# Patient Record
Sex: Female | Born: 1945 | Race: White | Hispanic: No | Smoking: Former smoker
Health system: Southern US, Community
[De-identification: ages and names within clinical notes are randomized; demographics above are authoritative.]

## PROBLEM LIST (undated history)

## (undated) DIAGNOSIS — D751 Secondary polycythemia: Secondary | ICD-10-CM

## (undated) DIAGNOSIS — M810 Age-related osteoporosis without current pathological fracture: Secondary | ICD-10-CM

## (undated) DIAGNOSIS — K209 Esophagitis, unspecified without bleeding: Secondary | ICD-10-CM

## (undated) DIAGNOSIS — J449 Chronic obstructive pulmonary disease, unspecified: Secondary | ICD-10-CM

## (undated) DIAGNOSIS — K219 Gastro-esophageal reflux disease without esophagitis: Secondary | ICD-10-CM

## (undated) DIAGNOSIS — J189 Pneumonia, unspecified organism: Secondary | ICD-10-CM

## (undated) DIAGNOSIS — Z87891 Personal history of nicotine dependence: Secondary | ICD-10-CM

## (undated) DIAGNOSIS — K5520 Angiodysplasia of colon without hemorrhage: Secondary | ICD-10-CM

## (undated) DIAGNOSIS — K5730 Diverticulosis of large intestine without perforation or abscess without bleeding: Secondary | ICD-10-CM

## (undated) HISTORY — DX: Hereditary hemochromatosis: E83.110

## (undated) HISTORY — PX: ABDOMINAL HYSTERECTOMY: SHX81

## (undated) HISTORY — PX: CATARACT EXTRACTION: SUR2

---

## 2000-10-17 ENCOUNTER — Ambulatory Visit (HOSPITAL_COMMUNITY): Admission: RE | Admit: 2000-10-17 | Discharge: 2000-10-17 | Payer: Self-pay | Admitting: Family Medicine

## 2000-10-17 ENCOUNTER — Encounter: Payer: Self-pay | Admitting: Family Medicine

## 2001-03-11 ENCOUNTER — Ambulatory Visit (HOSPITAL_COMMUNITY): Admission: RE | Admit: 2001-03-11 | Discharge: 2001-03-11 | Payer: Self-pay | Admitting: Family Medicine

## 2001-03-11 ENCOUNTER — Encounter: Payer: Self-pay | Admitting: Family Medicine

## 2002-05-06 ENCOUNTER — Ambulatory Visit (HOSPITAL_COMMUNITY): Admission: RE | Admit: 2002-05-06 | Discharge: 2002-05-06 | Payer: Self-pay | Admitting: General Surgery

## 2002-05-06 ENCOUNTER — Encounter: Payer: Self-pay | Admitting: General Surgery

## 2002-07-23 ENCOUNTER — Encounter: Payer: Self-pay | Admitting: Family Medicine

## 2002-07-23 ENCOUNTER — Ambulatory Visit (HOSPITAL_COMMUNITY): Admission: RE | Admit: 2002-07-23 | Discharge: 2002-07-23 | Payer: Self-pay | Admitting: Family Medicine

## 2002-09-29 ENCOUNTER — Encounter: Payer: Self-pay | Admitting: Internal Medicine

## 2002-09-29 ENCOUNTER — Inpatient Hospital Stay (HOSPITAL_COMMUNITY): Admission: AD | Admit: 2002-09-29 | Discharge: 2002-10-01 | Payer: Self-pay | Admitting: Internal Medicine

## 2002-11-03 ENCOUNTER — Ambulatory Visit (HOSPITAL_COMMUNITY): Admission: RE | Admit: 2002-11-03 | Discharge: 2002-11-03 | Payer: Self-pay | Admitting: Internal Medicine

## 2002-11-03 ENCOUNTER — Encounter: Payer: Self-pay | Admitting: Internal Medicine

## 2004-02-17 ENCOUNTER — Ambulatory Visit (HOSPITAL_COMMUNITY): Admission: RE | Admit: 2004-02-17 | Discharge: 2004-02-17 | Payer: Self-pay | Admitting: Family Medicine

## 2004-03-30 ENCOUNTER — Ambulatory Visit (HOSPITAL_COMMUNITY): Admission: RE | Admit: 2004-03-30 | Discharge: 2004-03-30 | Payer: Self-pay | Admitting: Family Medicine

## 2004-04-12 ENCOUNTER — Ambulatory Visit (HOSPITAL_COMMUNITY): Admission: RE | Admit: 2004-04-12 | Discharge: 2004-04-12 | Payer: Self-pay | Admitting: Pulmonary Disease

## 2004-04-20 ENCOUNTER — Ambulatory Visit (HOSPITAL_COMMUNITY): Admission: RE | Admit: 2004-04-20 | Discharge: 2004-04-20 | Payer: Self-pay | Admitting: Family Medicine

## 2004-08-08 ENCOUNTER — Ambulatory Visit (HOSPITAL_COMMUNITY): Admission: RE | Admit: 2004-08-08 | Discharge: 2004-08-08 | Payer: Self-pay | Admitting: Internal Medicine

## 2004-08-29 ENCOUNTER — Ambulatory Visit (HOSPITAL_COMMUNITY): Admission: RE | Admit: 2004-08-29 | Discharge: 2004-08-29 | Payer: Self-pay | Admitting: General Surgery

## 2005-11-23 ENCOUNTER — Ambulatory Visit (HOSPITAL_COMMUNITY): Admission: RE | Admit: 2005-11-23 | Discharge: 2005-11-23 | Payer: Self-pay | Admitting: Internal Medicine

## 2007-08-20 ENCOUNTER — Ambulatory Visit (HOSPITAL_COMMUNITY): Admission: RE | Admit: 2007-08-20 | Discharge: 2007-08-20 | Payer: Self-pay | Admitting: Internal Medicine

## 2007-11-20 ENCOUNTER — Ambulatory Visit (HOSPITAL_COMMUNITY): Admission: RE | Admit: 2007-11-20 | Discharge: 2007-11-20 | Payer: Self-pay | Admitting: Internal Medicine

## 2008-01-20 ENCOUNTER — Encounter (HOSPITAL_COMMUNITY): Admission: RE | Admit: 2008-01-20 | Discharge: 2008-02-19 | Payer: Self-pay | Admitting: Oncology

## 2008-01-20 ENCOUNTER — Ambulatory Visit (HOSPITAL_COMMUNITY): Payer: Self-pay | Admitting: Oncology

## 2008-02-25 ENCOUNTER — Encounter (HOSPITAL_COMMUNITY): Admission: RE | Admit: 2008-02-25 | Discharge: 2008-03-26 | Payer: Self-pay | Admitting: Oncology

## 2008-03-30 ENCOUNTER — Encounter (HOSPITAL_COMMUNITY): Admission: RE | Admit: 2008-03-30 | Discharge: 2008-04-29 | Payer: Self-pay | Admitting: Oncology

## 2008-03-30 ENCOUNTER — Ambulatory Visit (HOSPITAL_COMMUNITY): Payer: Self-pay | Admitting: Oncology

## 2008-06-02 ENCOUNTER — Ambulatory Visit (HOSPITAL_COMMUNITY): Admission: RE | Admit: 2008-06-02 | Discharge: 2008-06-02 | Payer: Self-pay | Admitting: Internal Medicine

## 2008-09-30 ENCOUNTER — Ambulatory Visit (HOSPITAL_COMMUNITY): Admission: RE | Admit: 2008-09-30 | Discharge: 2008-09-30 | Payer: Self-pay | Admitting: Internal Medicine

## 2010-04-17 ENCOUNTER — Encounter: Payer: Self-pay | Admitting: General Surgery

## 2010-07-11 LAB — CBC
Hemoglobin: 13.4 g/dL (ref 12.0–15.0)
MCHC: 33.4 g/dL (ref 30.0–36.0)
Platelets: 426 10*3/uL — ABNORMAL HIGH (ref 150–400)
RDW: 13.1 % (ref 11.5–15.5)

## 2010-07-11 LAB — HEMOCHROMATOSIS DNA-PCR(C282Y,H63D)

## 2010-07-11 LAB — FERRITIN: Ferritin: 436 ng/mL — ABNORMAL HIGH (ref 10–291)

## 2010-08-12 NOTE — Discharge Summary (Signed)
   NAMEINOLA, Leah Dalton                        ACCOUNT NO.:  [id number]   MEDICAL RECORD NO.:  [id number]                   PATIENT TYPE:  INP   LOCATION:  A209                                 FACILITY:  [facility]   PHYSICIAN:  Madelin Rear. Sherwood Gambler, M.D.             DATE OF BIRTH:  28-Feb-1946   DATE OF ADMISSION:  09/29/2002  DATE OF DISCHARGE:  10/01/2002                                 DISCHARGE SUMMARY   DISCHARGE DIAGNOSES:  1. Acute exacerbation of chronic obstructive pulmonary disease.  2. Pneumonia.  3. Respiratory insufficiency.   SUMMARY:  The patient was admitted for progressive dyspnea, cough, sputum,  fever, rigors, and chills, and severe malaise.  Attempts at management at  home were unsuccessful and the patient progressed in her symptomatology.  She was admitted and given IV bronchodilators as well as antibiotics and  around-the-clock nebulizers.  She responded well to these interventions with  an increase in her energy level, decrease in wheezing, and defervescence.   DISCHARGE MEDICATIONS:  1. Theo-Dur 200 mg p.o. b.i.d.  2. Combivent 2 puffs q.i.d.  3. Medrol Dosepak.  4. LevaPak.                                               Madelin Rear. Sherwood Gambler, M.D.    LJF/MEDQ  D:  10/23/2002  T:  10/23/2002  Job:  [id number]

## 2010-08-12 NOTE — H&P (Signed)
   Leah Dalton, Leah Dalton                          ACCOUNT NO.:  [id number]   MEDICAL RECORD NO.:  [id number]                  PATIENT TYPE:   LOCATION:                                       FACILITY:   PHYSICIAN:  Madelin Rear. Sherwood Gambler, M.D.             DATE OF BIRTH:  October 02, 1945   DATE OF ADMISSION:  DATE OF DISCHARGE:                                HISTORY & PHYSICAL   CHIEF COMPLAINT:  Shortness of breath.   HISTORY OF PRESENT ILLNESS:  The patient has had 3 or 4 days of  progressively increasing dyspnea, cough productive of sputum, fever, rigors  or chills and severe malaise.  He also admitted to some myalgias.  No  vomiting, no chest pain, palpitations or syncope.  No hematemesis,  hematochezia or melena.   ALLERGIES:  CODEINE and KEFLEX allergic.   PAST MEDICAL HISTORY:  She does have a past history of gastroesophageal  reflux disease and maintained on Prevacid. She has had COPD in the past and  maintained on Atrovent inhaler.   SOCIAL HISTORY:  She admits to cigarette smoking.  Admits to alcohol daily  usage, but no other substance abuses of note.   FAMILY HISTORY:  Positive for coronary artery disease and breast cancer.  Has 1 son, age 32, in good health.   REVIEW OF SYSTEMS:  As under HPI, otherwise negative.   PHYSICAL EXAMINATION:  VITAL SIGNS:  Temperature is 100, pulse is 90,  respirations 18 with diminished breath sounds.  Her blood pressure is  110/62.  Weight was 135 1/2 pounds.  GENERAL: She appears moderately ill although nontoxic.  HEAD AND NECK:  Show no JVD or adenopathy.  Neck was supple.  CHEST:  Diminished breath sounds bilaterally with end expiratory wheezing,  prolonged expiratory phase.  CARDIOVASCULAR:  Cardiac exam regular rhythm.  No murmur, gallop, or rub.  ABDOMEN:  Soft, no organomegaly or masses.  EXTREMITIES:  Without clubbing, cyanosis, or edema.  NEUROLOGIC:  Examination is nonfocal.   LABORATORY DATA:  Laboratory studies are pending at  the present time with  blood cultures, chest x-ray, blood count, and metabolic profile.    IMPRESSION AND PLAN:  The patient appears clinically to have severe chronic  obstructive pulmonary disease (COPD) exacerbation with pneumonia possible.  She will be admitted for bronchodilator therapy, respiratory support,  intravenous antibiotics, intravenous hydration, and expectant observation.                                               Madelin Rear. Sherwood Gambler, M.D.    LJF/MEDQ  D:  09/29/2002  T:  09/29/2002  Job:  [id number]

## 2010-08-12 NOTE — Procedures (Signed)
[name], Leah Dalton              ACCOUNT NO.:  [id number]   MEDICAL RECORD NO.:  [id number]          PATIENT TYPE:  OUT   LOCATION:  RAD                           FACILITY:  [facility]   PHYSICIAN:  Edward L. Juanetta Gosling, M.D.DATE OF BIRTH:  [birth year]   DATE OF PROCEDURE:  DATE OF DISCHARGE:                              PULMONARY FUNCTION TEST   1.  Spirometry shows a moderate to severe ventilatory defect with evidence      of air flow obstruction.  2.  Lung volumes are normal with evidence of air trapping.  3.  DLCO is severely reduced.  4.  Arterial blood gases are normal.  5.  There is no significant bronchodilator effect.     Edwa   ELH/MEDQ  D:  04/13/2004  T:  04/13/2004  Job:  [id number]   cc:   [facility]

## 2010-12-27 LAB — COMPREHENSIVE METABOLIC PANEL
AST: 15 U/L (ref 0–37)
Albumin: 3.9 g/dL (ref 3.5–5.2)
BUN: 4 mg/dL — ABNORMAL LOW (ref 6–23)
Calcium: 9.6 mg/dL (ref 8.4–10.5)
Creatinine, Ser: 0.43 mg/dL (ref 0.4–1.2)
GFR calc Af Amer: >60 mL/min (ref >60)
Total Bilirubin: 0.6 mg/dL (ref 0.3–1.2)

## 2010-12-27 LAB — CBC
HCT: 42.3 % (ref 36.0–46.0)
MCHC: 34 g/dL (ref 30.0–36.0)
MCV: 98.9 fL (ref 78.0–100.0)
Platelets: 415 10*3/uL — ABNORMAL HIGH (ref 150–400)
RDW: 12.8 % (ref 11.5–15.5)

## 2010-12-27 LAB — THERAPEUTIC PHLEBOTOMY: Volume Drawn: 500

## 2010-12-29 LAB — THERAPEUTIC PHLEBOTOMY: Volume Drawn: 500

## 2011-04-17 DIAGNOSIS — J449 Chronic obstructive pulmonary disease, unspecified: Secondary | ICD-10-CM | POA: Diagnosis not present

## 2011-04-19 DIAGNOSIS — J441 Chronic obstructive pulmonary disease with (acute) exacerbation: Secondary | ICD-10-CM | POA: Diagnosis not present

## 2011-04-19 DIAGNOSIS — J438 Other emphysema: Secondary | ICD-10-CM | POA: Diagnosis not present

## 2011-05-31 DIAGNOSIS — J449 Chronic obstructive pulmonary disease, unspecified: Secondary | ICD-10-CM | POA: Diagnosis not present

## 2011-05-31 DIAGNOSIS — J438 Other emphysema: Secondary | ICD-10-CM | POA: Diagnosis not present

## 2011-06-28 DIAGNOSIS — J438 Other emphysema: Secondary | ICD-10-CM | POA: Diagnosis not present

## 2011-06-28 DIAGNOSIS — M810 Age-related osteoporosis without current pathological fracture: Secondary | ICD-10-CM | POA: Diagnosis not present

## 2011-06-28 DIAGNOSIS — Z13820 Encounter for screening for osteoporosis: Secondary | ICD-10-CM | POA: Diagnosis not present

## 2011-06-28 DIAGNOSIS — Z780 Asymptomatic menopausal state: Secondary | ICD-10-CM | POA: Diagnosis not present

## 2011-06-28 DIAGNOSIS — Z90710 Acquired absence of both cervix and uterus: Secondary | ICD-10-CM | POA: Diagnosis not present

## 2011-07-06 DIAGNOSIS — R635 Abnormal weight gain: Secondary | ICD-10-CM | POA: Diagnosis not present

## 2011-07-06 DIAGNOSIS — Z13220 Encounter for screening for lipoid disorders: Secondary | ICD-10-CM | POA: Diagnosis not present

## 2011-07-06 DIAGNOSIS — J449 Chronic obstructive pulmonary disease, unspecified: Secondary | ICD-10-CM | POA: Diagnosis not present

## 2011-07-20 DIAGNOSIS — R635 Abnormal weight gain: Secondary | ICD-10-CM | POA: Diagnosis not present

## 2011-07-20 DIAGNOSIS — M810 Age-related osteoporosis without current pathological fracture: Secondary | ICD-10-CM | POA: Diagnosis not present

## 2011-07-20 DIAGNOSIS — J449 Chronic obstructive pulmonary disease, unspecified: Secondary | ICD-10-CM | POA: Diagnosis not present

## 2011-07-20 DIAGNOSIS — R7301 Impaired fasting glucose: Secondary | ICD-10-CM | POA: Diagnosis not present

## 2011-07-27 DIAGNOSIS — J449 Chronic obstructive pulmonary disease, unspecified: Secondary | ICD-10-CM | POA: Diagnosis not present

## 2011-07-27 DIAGNOSIS — J438 Other emphysema: Secondary | ICD-10-CM | POA: Diagnosis not present

## 2011-10-12 DIAGNOSIS — M810 Age-related osteoporosis without current pathological fracture: Secondary | ICD-10-CM | POA: Diagnosis not present

## 2011-10-12 DIAGNOSIS — J449 Chronic obstructive pulmonary disease, unspecified: Secondary | ICD-10-CM | POA: Diagnosis not present

## 2011-10-12 DIAGNOSIS — R498 Other voice and resonance disorders: Secondary | ICD-10-CM | POA: Diagnosis not present

## 2011-11-16 DIAGNOSIS — Z1239 Encounter for other screening for malignant neoplasm of breast: Secondary | ICD-10-CM | POA: Diagnosis not present

## 2011-11-16 DIAGNOSIS — M810 Age-related osteoporosis without current pathological fracture: Secondary | ICD-10-CM | POA: Diagnosis not present

## 2011-11-28 DIAGNOSIS — Z1231 Encounter for screening mammogram for malignant neoplasm of breast: Secondary | ICD-10-CM | POA: Diagnosis not present

## 2011-11-29 DIAGNOSIS — IMO0002 Reserved for concepts with insufficient information to code with codable children: Secondary | ICD-10-CM | POA: Diagnosis not present

## 2011-11-29 DIAGNOSIS — M810 Age-related osteoporosis without current pathological fracture: Secondary | ICD-10-CM | POA: Diagnosis not present

## 2011-11-29 DIAGNOSIS — M25569 Pain in unspecified knee: Secondary | ICD-10-CM | POA: Diagnosis not present

## 2011-11-30 DIAGNOSIS — J438 Other emphysema: Secondary | ICD-10-CM | POA: Diagnosis not present

## 2011-11-30 DIAGNOSIS — R0989 Other specified symptoms and signs involving the circulatory and respiratory systems: Secondary | ICD-10-CM | POA: Diagnosis not present

## 2011-11-30 DIAGNOSIS — J449 Chronic obstructive pulmonary disease, unspecified: Secondary | ICD-10-CM | POA: Diagnosis not present

## 2011-12-07 DIAGNOSIS — R05 Cough: Secondary | ICD-10-CM | POA: Diagnosis not present

## 2011-12-07 DIAGNOSIS — R0902 Hypoxemia: Secondary | ICD-10-CM | POA: Diagnosis not present

## 2011-12-07 DIAGNOSIS — J441 Chronic obstructive pulmonary disease with (acute) exacerbation: Secondary | ICD-10-CM | POA: Diagnosis not present

## 2011-12-26 DIAGNOSIS — J0100 Acute maxillary sinusitis, unspecified: Secondary | ICD-10-CM | POA: Diagnosis not present

## 2011-12-26 DIAGNOSIS — J441 Chronic obstructive pulmonary disease with (acute) exacerbation: Secondary | ICD-10-CM | POA: Diagnosis not present

## 2012-01-08 DIAGNOSIS — J441 Chronic obstructive pulmonary disease with (acute) exacerbation: Secondary | ICD-10-CM | POA: Diagnosis not present

## 2012-01-08 DIAGNOSIS — Z23 Encounter for immunization: Secondary | ICD-10-CM | POA: Diagnosis not present

## 2012-02-24 DIAGNOSIS — R071 Chest pain on breathing: Secondary | ICD-10-CM | POA: Diagnosis not present

## 2012-02-24 DIAGNOSIS — Z79899 Other long term (current) drug therapy: Secondary | ICD-10-CM | POA: Diagnosis not present

## 2012-02-24 DIAGNOSIS — R091 Pleurisy: Secondary | ICD-10-CM | POA: Diagnosis not present

## 2012-02-24 DIAGNOSIS — R079 Chest pain, unspecified: Secondary | ICD-10-CM | POA: Diagnosis not present

## 2012-02-24 DIAGNOSIS — Z888 Allergy status to other drugs, medicaments and biological substances status: Secondary | ICD-10-CM | POA: Diagnosis not present

## 2012-02-24 DIAGNOSIS — R0602 Shortness of breath: Secondary | ICD-10-CM | POA: Diagnosis not present

## 2012-02-24 DIAGNOSIS — J449 Chronic obstructive pulmonary disease, unspecified: Secondary | ICD-10-CM | POA: Diagnosis not present

## 2012-02-24 DIAGNOSIS — Z90710 Acquired absence of both cervix and uterus: Secondary | ICD-10-CM | POA: Diagnosis not present

## 2012-02-24 DIAGNOSIS — R0989 Other specified symptoms and signs involving the circulatory and respiratory systems: Secondary | ICD-10-CM | POA: Diagnosis not present

## 2012-02-24 DIAGNOSIS — Z881 Allergy status to other antibiotic agents status: Secondary | ICD-10-CM | POA: Diagnosis not present

## 2012-02-24 DIAGNOSIS — Z880 Allergy status to penicillin: Secondary | ICD-10-CM | POA: Diagnosis not present

## 2012-02-24 DIAGNOSIS — Z9849 Cataract extraction status, unspecified eye: Secondary | ICD-10-CM | POA: Diagnosis not present

## 2012-02-24 DIAGNOSIS — R0609 Other forms of dyspnea: Secondary | ICD-10-CM | POA: Diagnosis not present

## 2012-05-16 DIAGNOSIS — L821 Other seborrheic keratosis: Secondary | ICD-10-CM | POA: Diagnosis not present

## 2012-05-16 DIAGNOSIS — J438 Other emphysema: Secondary | ICD-10-CM | POA: Diagnosis not present

## 2012-05-16 DIAGNOSIS — Z6826 Body mass index (BMI) 26.0-26.9, adult: Secondary | ICD-10-CM | POA: Diagnosis not present

## 2012-05-16 DIAGNOSIS — H6120 Impacted cerumen, unspecified ear: Secondary | ICD-10-CM | POA: Diagnosis not present

## 2012-05-29 DIAGNOSIS — D234 Other benign neoplasm of skin of scalp and neck: Secondary | ICD-10-CM | POA: Diagnosis not present

## 2012-05-29 DIAGNOSIS — L989 Disorder of the skin and subcutaneous tissue, unspecified: Secondary | ICD-10-CM | POA: Diagnosis not present

## 2012-05-29 DIAGNOSIS — H60399 Other infective otitis externa, unspecified ear: Secondary | ICD-10-CM | POA: Diagnosis not present

## 2012-05-30 DIAGNOSIS — L821 Other seborrheic keratosis: Secondary | ICD-10-CM | POA: Diagnosis not present

## 2012-06-07 ENCOUNTER — Emergency Department (HOSPITAL_COMMUNITY): Payer: Medicare Other

## 2012-06-07 ENCOUNTER — Inpatient Hospital Stay (HOSPITAL_COMMUNITY)
Admission: EM | Admit: 2012-06-07 | Discharge: 2012-06-10 | DRG: 190 | Disposition: A | Discharge status: Home Health | Payer: Medicare Other | Attending: Internal Medicine | Admitting: Internal Medicine

## 2012-06-07 ENCOUNTER — Encounter (HOSPITAL_COMMUNITY): Payer: Self-pay | Admitting: *Deleted

## 2012-06-07 DIAGNOSIS — D45 Polycythemia vera: Secondary | ICD-10-CM | POA: Diagnosis not present

## 2012-06-07 DIAGNOSIS — J329 Chronic sinusitis, unspecified: Secondary | ICD-10-CM | POA: Diagnosis present

## 2012-06-07 DIAGNOSIS — Z87891 Personal history of nicotine dependence: Secondary | ICD-10-CM | POA: Diagnosis not present

## 2012-06-07 DIAGNOSIS — J9620 Acute and chronic respiratory failure, unspecified whether with hypoxia or hypercapnia: Secondary | ICD-10-CM | POA: Diagnosis present

## 2012-06-07 DIAGNOSIS — Z9981 Dependence on supplemental oxygen: Secondary | ICD-10-CM | POA: Diagnosis not present

## 2012-06-07 DIAGNOSIS — J438 Other emphysema: Secondary | ICD-10-CM | POA: Diagnosis not present

## 2012-06-07 DIAGNOSIS — J449 Chronic obstructive pulmonary disease, unspecified: Secondary | ICD-10-CM | POA: Diagnosis present

## 2012-06-07 DIAGNOSIS — M810 Age-related osteoporosis without current pathological fracture: Secondary | ICD-10-CM | POA: Diagnosis present

## 2012-06-07 DIAGNOSIS — J440 Chronic obstructive pulmonary disease with acute lower respiratory infection: Principal | ICD-10-CM | POA: Diagnosis present

## 2012-06-07 DIAGNOSIS — J441 Chronic obstructive pulmonary disease with (acute) exacerbation: Secondary | ICD-10-CM

## 2012-06-07 DIAGNOSIS — Z79899 Other long term (current) drug therapy: Secondary | ICD-10-CM

## 2012-06-07 DIAGNOSIS — D751 Secondary polycythemia: Secondary | ICD-10-CM | POA: Diagnosis present

## 2012-06-07 DIAGNOSIS — E871 Hypo-osmolality and hyponatremia: Secondary | ICD-10-CM | POA: Diagnosis not present

## 2012-06-07 DIAGNOSIS — J209 Acute bronchitis, unspecified: Principal | ICD-10-CM | POA: Diagnosis present

## 2012-06-07 DIAGNOSIS — R0602 Shortness of breath: Secondary | ICD-10-CM | POA: Diagnosis not present

## 2012-06-07 HISTORY — DX: Secondary polycythemia: D75.1

## 2012-06-07 HISTORY — DX: Age-related osteoporosis without current pathological fracture: M81.0

## 2012-06-07 HISTORY — DX: Chronic obstructive pulmonary disease, unspecified: J44.9

## 2012-06-07 HISTORY — DX: Personal history of nicotine dependence: Z87.891

## 2012-06-07 LAB — BASIC METABOLIC PANEL
BUN: <3 mg/dL — ABNORMAL LOW (ref 6–23)
CO2: 24 mEq/L (ref 19–32)
Calcium: 9.1 mg/dL (ref 8.4–10.5)
Creatinine, Ser: 0.43 mg/dL — ABNORMAL LOW (ref 0.50–1.10)
GFR calc non Af Amer: >90 mL/min (ref >90)
Glucose, Bld: 117 mg/dL — ABNORMAL HIGH (ref 70–99)

## 2012-06-07 LAB — CBC WITH DIFFERENTIAL/PLATELET
Eosinophils Absolute: 0.1 10*3/uL (ref 0.0–0.7)
Eosinophils Relative: 1 % (ref 0–5)
HCT: 47.8 % — ABNORMAL HIGH (ref 36.0–46.0)
Lymphocytes Relative: 7 % — ABNORMAL LOW (ref 12–46)
Lymphs Abs: 0.6 10*3/uL — ABNORMAL LOW (ref 0.7–4.0)
MCH: 31.1 pg (ref 26.0–34.0)
MCV: 93.5 fL (ref 78.0–100.0)
Monocytes Absolute: 0.8 10*3/uL (ref 0.1–1.0)
Platelets: 265 10*3/uL (ref 150–400)
RBC: 5.11 MIL/uL (ref 3.87–5.11)
WBC: 7.8 10*3/uL (ref 4.0–10.5)

## 2012-06-07 MED ORDER — METHYLPREDNISOLONE SODIUM SUCC 125 MG IJ SOLR
80.0000 mg | Freq: Four times a day (QID) | INTRAMUSCULAR | Status: DC
  Administered 2012-06-07 – 2012-06-08 (×3): 80 mg via INTRAVENOUS
  Filled 2012-06-07 (×3): qty 2

## 2012-06-07 MED ORDER — ALPRAZOLAM 0.5 MG PO TABS
0.5000 mg | ORAL_TABLET | Freq: Three times a day (TID) | ORAL | Status: DC | PRN
  Administered 2012-06-07 – 2012-06-09 (×3): 0.5 mg via ORAL
  Filled 2012-06-07 (×4): qty 1

## 2012-06-07 MED ORDER — METHYLPREDNISOLONE SODIUM SUCC 125 MG IJ SOLR
125.0000 mg | Freq: Once | INTRAMUSCULAR | Status: AC
  Administered 2012-06-07: 125 mg via INTRAVENOUS
  Filled 2012-06-07: qty 2

## 2012-06-07 MED ORDER — ALBUTEROL SULFATE (5 MG/ML) 0.5% IN NEBU
2.5000 mg | INHALATION_SOLUTION | RESPIRATORY_TRACT | Status: DC
  Administered 2012-06-07 – 2012-06-10 (×16): 2.5 mg via RESPIRATORY_TRACT
  Filled 2012-06-07 (×17): qty 0.5

## 2012-06-07 MED ORDER — ALBUTEROL (5 MG/ML) CONTINUOUS INHALATION SOLN
15.0000 mg | INHALATION_SOLUTION | Freq: Once | RESPIRATORY_TRACT | Status: AC
  Administered 2012-06-07: 15 mg via RESPIRATORY_TRACT
  Filled 2012-06-07: qty 20

## 2012-06-07 MED ORDER — BIOTENE DRY MOUTH MT LIQD
15.0000 mL | Freq: Two times a day (BID) | OROMUCOSAL | Status: DC
  Administered 2012-06-07 – 2012-06-09 (×5): 15 mL via OROMUCOSAL

## 2012-06-07 MED ORDER — BENZONATATE 100 MG PO CAPS
100.0000 mg | ORAL_CAPSULE | Freq: Three times a day (TID) | ORAL | Status: DC
  Administered 2012-06-07 – 2012-06-10 (×9): 100 mg via ORAL
  Filled 2012-06-07 (×9): qty 1

## 2012-06-07 MED ORDER — DOCUSATE SODIUM 100 MG PO CAPS
100.0000 mg | ORAL_CAPSULE | Freq: Two times a day (BID) | ORAL | Status: DC
  Administered 2012-06-08 – 2012-06-10 (×5): 100 mg via ORAL
  Filled 2012-06-07 (×6): qty 1

## 2012-06-07 MED ORDER — HYDROCOD POLST-CHLORPHEN POLST 10-8 MG/5ML PO LQCR
5.0000 mL | Freq: Two times a day (BID) | ORAL | Status: DC | PRN
  Administered 2012-06-08: 5 mL via ORAL
  Filled 2012-06-07 (×2): qty 5

## 2012-06-07 MED ORDER — LEVOFLOXACIN IN D5W 750 MG/150ML IV SOLN
750.0000 mg | INTRAVENOUS | Status: DC
  Administered 2012-06-07: 750 mg via INTRAVENOUS
  Filled 2012-06-07 (×3): qty 150

## 2012-06-07 MED ORDER — CALCIUM CARBONATE 600 MG PO TABS: 600.0000 mg | ORAL_TABLET | Freq: Two times a day (BID) | ORAL | Status: DC

## 2012-06-07 MED ORDER — MOMETASONE FURO-FORMOTEROL FUM 100-5 MCG/ACT IN AERO
2.0000 | INHALATION_SPRAY | Freq: Two times a day (BID) | RESPIRATORY_TRACT | Status: DC
  Administered 2012-06-07 – 2012-06-10 (×6): 2 via RESPIRATORY_TRACT
  Filled 2012-06-07: qty 8.8

## 2012-06-07 MED ORDER — ACETAMINOPHEN 650 MG RE SUPP: 650.0000 mg | Freq: Four times a day (QID) | RECTAL | Status: DC | PRN

## 2012-06-07 MED ORDER — ALUM & MAG HYDROXIDE-SIMETH 200-200-20 MG/5ML PO SUSP
30.0000 mL | Freq: Four times a day (QID) | ORAL | Status: DC | PRN
  Administered 2012-06-09: 30 mL via ORAL
  Filled 2012-06-07: qty 30

## 2012-06-07 MED ORDER — FAMOTIDINE 20 MG PO TABS
20.0000 mg | ORAL_TABLET | Freq: Two times a day (BID) | ORAL | Status: DC
  Administered 2012-06-07 – 2012-06-10 (×6): 20 mg via ORAL
  Filled 2012-06-07 (×6): qty 1

## 2012-06-07 MED ORDER — SODIUM CHLORIDE 0.9 % IV SOLN
INTRAVENOUS | Status: DC
  Administered 2012-06-07: 12:00:00 via INTRAVENOUS

## 2012-06-07 MED ORDER — ACETAMINOPHEN 325 MG PO TABS: 650.0000 mg | ORAL_TABLET | Freq: Four times a day (QID) | ORAL | Status: DC | PRN

## 2012-06-07 MED ORDER — SODIUM CHLORIDE 0.9 % IV SOLN
INTRAVENOUS | Status: AC
  Administered 2012-06-07: 18:00:00 via INTRAVENOUS

## 2012-06-07 MED ORDER — VITAMIN D-3 125 MCG (5000 UT) PO TABS: 1.0000 | ORAL_TABLET | Freq: Every day | ORAL | Status: DC

## 2012-06-07 MED ORDER — ONDANSETRON HCL 4 MG PO TABS: 4.0000 mg | ORAL_TABLET | Freq: Four times a day (QID) | ORAL | Status: DC | PRN

## 2012-06-07 MED ORDER — ONDANSETRON HCL 4 MG/2ML IJ SOLN: 4.0000 mg | Freq: Four times a day (QID) | INTRAMUSCULAR | Status: DC | PRN

## 2012-06-07 MED ORDER — ALBUTEROL SULFATE (5 MG/ML) 0.5% IN NEBU: 2.5000 mg | INHALATION_SOLUTION | RESPIRATORY_TRACT | Status: DC | PRN

## 2012-06-07 MED ORDER — DM-GUAIFENESIN ER 30-600 MG PO TB12
1.0000 | ORAL_TABLET | Freq: Two times a day (BID) | ORAL | Status: DC
  Administered 2012-06-07 – 2012-06-10 (×6): 1 via ORAL
  Filled 2012-06-07 (×6): qty 1

## 2012-06-07 MED ORDER — IPRATROPIUM BROMIDE 0.02 % IN SOLN
1.0000 mg | Freq: Once | RESPIRATORY_TRACT | Status: AC
  Administered 2012-06-07: 1 mg via RESPIRATORY_TRACT
  Filled 2012-06-07: qty 5

## 2012-06-07 MED ORDER — VITAMIN D 1000 UNITS PO TABS
4000.0000 [IU] | ORAL_TABLET | Freq: Every day | ORAL | Status: DC
  Administered 2012-06-08 – 2012-06-10 (×3): 4000 [IU] via ORAL
  Filled 2012-06-07 (×3): qty 4

## 2012-06-07 MED ORDER — CALCIUM CARBONATE 1250 (500 CA) MG PO TABS
1.0000 | ORAL_TABLET | Freq: Two times a day (BID) | ORAL | Status: DC
  Administered 2012-06-07 – 2012-06-10 (×6): 500 mg via ORAL
  Filled 2012-06-07 (×6): qty 1

## 2012-06-07 MED ORDER — IPRATROPIUM BROMIDE 0.02 % IN SOLN
0.5000 mg | RESPIRATORY_TRACT | Status: DC
  Administered 2012-06-07 – 2012-06-10 (×16): 0.5 mg via RESPIRATORY_TRACT
  Filled 2012-06-07 (×17): qty 2.5

## 2012-06-07 NOTE — H&P (Signed)
[facility] History and Physical  Leah DREIBELBIS Dalton:[id number] DOB: 09/07/1945 DOA: 06/07/2012  Referring physician: Dr. Clarene Duke PCP: Leah Dalton., MD that she continue Specialists:   Chief Complaint: Shortness of breath.  HPI: Leah Dalton is a 67 y.o. female with a history significant for oxygen-dependent COPD who presents to the emergency department today with a chief complaint of shortness of breath. Her symptoms started approximately 5 days ago. Generally, when her "bronchitis acts up ", she starts to have a burning throat and  burning in her chest. These symptoms started 5 days ago. She called her primary care physician, Dr. Sherwood Dalton. He called in a prescription for Avelox and oral albuterol and recommended that she continue her nebulizer treatments. She took Avelox for 4 days, but she became progressively short of breath and developed chest congestion and wheezing. She started coughing with  production of yellow sputum. She has nasal congestion. She had one episode of posttussive emesis. She denies subjective fever or chills. She denies abdominal pain. She denies pleurisy. She denies swelling in her legs. She quit smoking in October 2012.  In the emergency department, she is afebrile and mildly tachycardic following a 1 hour continuous albuterol nebulizer per ED physician. Her chest x-ray reveals emphysema with no acute changes. Her lab data are significant for a serum sodium of 133, hemoglobin of 15.9, and glucose of 117. Her EKG reveals normal sinus rhythm with a heart rate of 91 beats per minute and nonspecific T-wave abnormalities. She is being admitted for further evaluation and management.   Review of Systems: As above in history present illness. The patient denies anorexia, fever, weight loss,, vision loss, decreased hearing, hoarseness, syncope, dyspnea on exertion, peripheral edema, balance deficits, hemoptysis, abdominal pain, melena, hematochezia, severe  indigestion/heartburn, hematuria, incontinence, genital sores, muscle weakness, suspicious skin lesions, transient blindness, difficulty walking, depression, unusual weight change, abnormal bleeding, enlarged lymph nodes, angioedema, and breast masses.    Past Medical History  Diagnosis Date  . COPD (chronic obstructive pulmonary disease)     O2 dependent, started 05/31/2012  . Polycythemia   . History of tobacco abuse   . Osteoporosis    Past Surgical History  Procedure Laterality Date  . Abdominal hysterectomy    . Cataract extraction     Social History: She is divorced. She has one son. She is retired. She stopped smoking in October 2012. She drinks a couple of beers each night. She denies any illicit drug use.    Allergies  Allergen Reactions  . Codeine Itching  . Daliresp (Roflumilast) Diarrhea  . Keflex (Cephalexin) Other (See Comments)    Gives pt yeast infection  . Penicillins Itching    Family history: Her mother died of breast cancer. Her father died of prostate cancer.  Prior to Admission medications   Medication Sig Start Date End Date Taking? Authorizing Ashaun Gaughan  albuterol (PROVENTIL HFA;VENTOLIN HFA) 108 (90 BASE) MCG/ACT inhaler Inhale 2 puffs into the lungs every 6 (six) hours as needed for wheezing.   Yes Historical Nayef College, MD  albuterol (PROVENTIL) (2.5 MG/3ML) 0.083% nebulizer solution Take 2.5 mg by nebulization 3 (three) times daily.   Yes Historical Camry Theiss, MD  calcium carbonate (OS-CAL) 600 MG TABS Take 600 mg by mouth 2 (two) times daily with a meal.   Yes Historical Abishai Viegas, MD  Cholecalciferol (VITAMIN D-3) 5000 UNITS TABS Take 1 tablet by mouth daily.   Yes Historical Nolawi Kanady, MD  dextromethorphan-guaiFENesin (MUCINEX DM) 30-600 MG per 12 hr tablet Take 1  tablet by mouth every 12 (twelve) hours.   Yes Historical Rhona Fusilier, MD  famotidine (PEPCID AC) 10 MG chewable tablet Chew 10 mg by mouth 2 (two) times daily.   Yes Historical Jamerica Snavely, MD   Fluticasone-Salmeterol (ADVAIR) 250-50 MCG/DOSE AEPB Inhale 1 puff into the lungs every 12 (twelve) hours.   Yes Historical Emnet Monk, MD  ipratropium (ATROVENT) 0.02 % nebulizer solution Take 500 mcg by nebulization 3 (three) times daily.   Yes Historical Chanan Detwiler, MD   Physical Exam: Filed Vitals:   06/07/12 1400 06/07/12 1403 06/07/12 1500 06/07/12 1711  BP: 142/79 137/82 144/85 149/85  Pulse: 108 108 103 105  Temp:    98.2 F (36.8 C)  TempSrc:      Resp: 17 18 17 20   Height:    5\' 4"  (1.626 m)  Weight:    68 kg (149 lb 14.6 oz)  SpO2: 93% 94% 94% 95%     General:  67 year old Caucasian woman sitting up in the bed, in no acute distress.  Eyes: Pupils equal, round, and reactive to light. Extraocular was are intact. Conjunctivae are clear. Sclerae are white.  ENT: Tympanic membranes are clear bilaterally. Nasal mucosa is moist, without rhinorrhea. Oropharynx reveals mildly dry mucous membranes. No posterior exudates or erythema.  Neck: Supple, no adenopathy, no thyromegaly, no JVD.  Cardiovascular: S1, S2, with tachycardia.  Respiratory: Rhonchus wheezing bilaterally, breathing nonlabored.  Abdomen: Positive bowel sounds, soft, nontender, nondistended.  Skin: Fair to good turgor. No rashes.  Musculoskeletal: No acute hot red joints. Pedal pulses palpable.  Psychiatric: Pleasant affect. Alert and oriented x3. Speech is clear.  Neurologic: Cranial nerves II through XII are intact. Strength is 5 over 5. Sensation is intact.  Labs on Admission:  Basic Metabolic Panel:  Recent Labs Lab 06/07/12 1042  NA 133*  K 3.7  CL 97  CO2 24  GLUCOSE 117*  BUN <3*  CREATININE 0.43*  CALCIUM 9.1   Liver Function Tests: No results found for this basename: AST, ALT, ALKPHOS, BILITOT, PROT, ALBUMIN,  in the last 168 hours No results found for this basename: LIPASE, AMYLASE,  in the last 168 hours No results found for this basename: AMMONIA,  in the last 168  hours CBC:  Recent Labs Lab 06/07/12 1042  WBC 7.8  NEUTROABS 6.4  HGB 15.9*  HCT 47.8*  MCV 93.5  PLT 265   Cardiac Enzymes:  Recent Labs Lab 06/07/12 1042  TROPONINI <0.30    BNP (last 3 results) No results found for this basename: PROBNP,  in the last 8760 hours CBG: No results found for this basename: GLUCAP,  in the last 168 hours  Radiological Exams on Admission: Dg Chest Portable 1 View  06/07/2012  *RADIOLOGY REPORT*  Clinical Data: Shortness of breath and chest pain.  PORTABLE CHEST - 1 VIEW  Comparison: CT chest 09/30/2008.  Findings: The lungs are markedly emphysematous.  No focal airspace disease, pneumothorax or effusion is identified.  Heart size is normal.  IMPRESSION: Emphysema without acute disease.   Original Report Authenticated By: Holley Dexter, M.D.     EKG: As above in history of present illness.  Assessment/Plan Principal Problem:   COPD with exacerbation Active Problems:   Acute-on-chronic respiratory failure   Polycythemia   Hyponatremia   1. This is a 67 year old woman with a history significant for oxygen-dependent COPD, who presents with COPD exacerbation and acute on chronic respiratory failure. After one hour albuterol nebulization, she does not appear to be in respiratory extremis.  She will require hospitalization for further treatment.      Plan: 1. The patient was given albuterol nebulizer, Levaquin, and 125 mg of IV Solu-Medrol. 2. We'll continue treatment with every 6 hour IV Solu-Medrol, Levaquin, albuterol/Atrovent nebulizers, and supportive treatment with Tessalon Perles and as needed Tussionex. We'll also add as needed alprazolam for anxiety and jitteriness associated with medication treatment. 3. We'll start sliding scale NovoLog empirically anticipating increase in capillary blood glucose. 4. Gentle IV fluids.   Code Status: Full code Family Communication: No family available Disposition Plan: Anticipate discharge  to home in 2-4 days.  Time spent: Greater than one hour.  [facility] Pager [phone]  If 7PM-7AM, please contact night-coverage [email or web address] Password [email or web address] 06/07/2012, 6:18 PM

## 2012-06-07 NOTE — ED Notes (Signed)
Attempted to call report. Aundra Millet, RN to call back.

## 2012-06-07 NOTE — ED Notes (Signed)
Report given to Megan, RN unit 300. Ready to receive patient.  

## 2012-06-07 NOTE — ED Notes (Signed)
Pt states cough and congestion began Sunday. Pain to chest began yesterday. Productive cough, light yellow in color. Pt states she was seen by Dr. Sherwood Gambler recently and was given a Rx for O2 but states, "I didn't know what to do with the prescription." RA O2 on arrival was 87%, increaswd to 94% on 2L.

## 2012-06-07 NOTE — Progress Notes (Signed)
ANTIBIOTIC CONSULT NOTE - INITIAL  Pharmacy Consult for Levaquin Indication: rule out pneumonia, SOB  Allergies  Allergen Reactions  . Codeine Itching  . Daliresp (Roflumilast) Diarrhea  . Keflex (Cephalexin) Other (See Comments)    Gives pt yeast infection  . Penicillins Itching    Patient Measurements: Height: 5\' 4"  (162.6 cm) Weight: 152 lb (68.947 kg) IBW/kg (Calculated) : 54.7  Vital Signs: Temp: 98.9 F (37.2 C) (03/14 1023) Temp src: Oral (03/14 1023) BP: 144/85 mmHg (03/14 1500) Pulse Rate: 103 (03/14 1500) Intake/Output from previous day:   Intake/Output from this shift:    Labs:  Recent Labs  06/07/12 1042  WBC 7.8  HGB 15.9*  PLT 265  CREATININE 0.43*   Estimated Creatinine Clearance: 65.1 ml/min (by C-G formula based on Cr of 0.43). No results found for this basename: VANCOTROUGH, VANCOPEAK, VANCORANDOM, GENTTROUGH, GENTPEAK, GENTRANDOM, TOBRATROUGH, TOBRAPEAK, TOBRARND, AMIKACINPEAK, AMIKACINTROU, AMIKACIN,  in the last 72 hours   Microbiology: No results found for this or any previous visit (from the past 720 hour(s)).  Medical History: Past Medical History  Diagnosis Date  . COPD (chronic obstructive pulmonary disease)     Medications:  Scheduled:  . [COMPLETED] albuterol  15 mg Nebulization Once  . [COMPLETED] ipratropium  1 mg Nebulization Once  . [COMPLETED] methylPREDNISolone (SOLU-MEDROL) injection  125 mg Intravenous Once   Assessment: 67yo female admitted with cough, congestion, and SOB.  Pt has good renal fxn.  Estimated Creatinine Clearance: 65.1 ml/min (by C-G formula based on Cr of 0.43).  Goal of Therapy:  Eradicate infection.  Plan:  Levaquin 750mg  IV q24hrs Monitor labs, renal fxn, and cultures per protocol  Leah Dalton A 06/07/2012,3:50 PM

## 2012-06-07 NOTE — ED Notes (Signed)
Ginger ale provided to patient per request.

## 2012-06-07 NOTE — ED Provider Notes (Signed)
History     CSN: [id number]  Arrival date & time 06/07/12  1010   First MD Initiated Contact with Patient 06/07/12 1107      Chief Complaint  Patient presents with  . Chest Pain  . Shortness of Breath    HPI Pt was seen at 1115.   Per pt, c/o gradual onset and worsening of persistent cough, wheezing and SOB for the past 5 days.  Describes her symptoms as "my COPD is acting up."  Has been using home MDI and nebs without relief.  States she has a rx for home O2 written by her PMD several weeks ago, but "didn't know how to get it filled."  Denies CP/palpitations, no back pain, no abd pain, no N/V/D, no fevers, no rash.    Past Medical History  Diagnosis Date  . COPD (chronic obstructive pulmonary disease)     Past Surgical History  Procedure Laterality Date  . Abdominal hysterectomy    . Cataract extraction      History  Substance Use Topics  . Smoking status: Former Games developer  . Smokeless tobacco: Not on file  . Alcohol Use: Yes     Comment: 3 beer nightly      Review of Systems ROS: Statement: All systems negative except as marked or noted in the HPI; Constitutional: Negative for fever and chills. ; ; Eyes: Negative for eye pain, redness and discharge. ; ; ENMT: Negative for ear pain, hoarseness, nasal congestion, sinus pressure and sore throat. ; ; Cardiovascular: Negative for chest pain, palpitations, diaphoresis, and peripheral edema. ; ; Respiratory: +cough, wheezing, SOB. Negative for stridor. ; ; Gastrointestinal: Negative for nausea, vomiting, diarrhea, abdominal pain, blood in stool, hematemesis, jaundice and rectal bleeding. . ; ; Genitourinary: Negative for dysuria, flank pain and hematuria. ; ; Musculoskeletal: Negative for back pain and neck pain. Negative for swelling and trauma.; ; Skin: Negative for pruritus, rash, abrasions, blisters, bruising and skin lesion.; ; Neuro: Negative for headache, lightheadedness and neck stiffness. Negative for weakness, altered level  of consciousness , altered mental status, extremity weakness, paresthesias, involuntary movement, seizure and syncope.       Allergies  Codeine; Daliresp; Keflex; and Penicillins  Home Medications   Current Outpatient Rx  Name  Route  Sig  Dispense  Refill  . albuterol (PROVENTIL HFA;VENTOLIN HFA) 108 (90 BASE) MCG/ACT inhaler   Inhalation   Inhale 2 puffs into the lungs every 6 (six) hours as needed for wheezing.         Marland Kitchen albuterol (PROVENTIL) (2.5 MG/3ML) 0.083% nebulizer solution   Nebulization   Take 2.5 mg by nebulization 3 (three) times daily.         . calcium carbonate (OS-CAL) 600 MG TABS   Oral   Take 600 mg by mouth 2 (two) times daily with a meal.         . Cholecalciferol (VITAMIN D-3) 5000 UNITS TABS   Oral   Take 1 tablet by mouth daily.         Marland Kitchen dextromethorphan-guaiFENesin (MUCINEX DM) 30-600 MG per 12 hr tablet   Oral   Take 1 tablet by mouth every 12 (twelve) hours.         . famotidine (PEPCID AC) 10 MG chewable tablet   Oral   Chew 10 mg by mouth 2 (two) times daily.         . Fluticasone-Salmeterol (ADVAIR) 250-50 MCG/DOSE AEPB   Inhalation   Inhale 1 puff into the lungs  every 12 (twelve) hours.         Marland Kitchen ipratropium (ATROVENT) 0.02 % nebulizer solution   Nebulization   Take 500 mcg by nebulization 3 (three) times daily.           BP 137/82  Pulse 108  Temp(Src) 98.9 F (37.2 C) (Oral)  Resp 18  Ht 5\' 4"  (1.626 m)  Wt 152 lb (68.947 kg)  BMI 26.08 kg/m2  SpO2 94%  Physical Exam 1120: Physical examination:  Nursing notes reviewed; Vital signs and O2 SAT reviewed;  Constitutional: Well developed, Well nourished, Uncomfortable appearing;; Head:  Normocephalic, atraumatic; Eyes: EOMI, PERRL, No scleral icterus; ENMT: Mouth and pharynx normal, Mucous membranes dry; Neck: Supple, Full range of motion, No lymphadenopathy; Cardiovascular: Tachycardic rate and rhythm, No gallop; Respiratory: Breath sounds diminished & equal  bilaterally, insp/exp wheezes bilat with audible wheezing.  Speaking in phrases, tachypneic, sitting upright.; Chest: Nontender, Movement normal; Abdomen: Soft, Nontender, Nondistended, Normal bowel sounds; Genitourinary: No CVA tenderness; Extremities: Pulses normal, No tenderness, No edema, No calf edema or asymmetry.; Neuro: AA&Ox3, Major CN grossly intact.  Speech clear. No gross focal motor or sensory deficits in extremities.; Skin: Color normal, Warm, Dry.   ED Course  Procedures   1125:  Pt with insp/exp wheezing on arrival.  IV solumedrol and continuous neb begun.  1300:  Pt continues on neb, lungs continue with insp/exp wheezes bilat. Sats 92-94% on neb.    1430:  Pt continues with insp/exp wheezes, Sats 94% on O2 N/C after neb.  No further audible wheezing, and is speaking in sentences now, still sitting upright.  Will start IV levaquin and admit for exacerbation COPD.  Dx and testing d/w pt and family.  Questions answered.  Verb understanding, agreeable to admit.  T/C to [facility] Dr. Sherrie Mustache, case discussed, including:  HPI, pertinent PM/SHx, VS/PE, dx testing, ED course and treatment:  Agreeable to observation admit, requests to write temporary orders, obtain tele bed to team 2.      MDM  MDM Reviewed: previous chart, nursing note and vitals Reviewed previous: ECG and labs Interpretation: ECG, labs and x-ray Total time providing critical care: 30-74 minutes. This excludes time spent performing separately reportable procedures and services. Consults: admitting MD     CRITICAL CARE Performed by: Laray Anger Total critical care time: 40 Critical care time was exclusive of separately billable procedures and treating other patients. Critical care was necessary to treat or prevent imminent or life-threatening deterioration. Critical care was time spent personally by me on the following activities: development of treatment plan with patient and/or surrogate as well as nursing,  discussions with consultants, evaluation of patient's response to treatment, examination of patient, obtaining history from patient or surrogate, ordering and performing treatments and interventions, ordering and review of laboratory studies, ordering and review of radiographic studies, pulse oximetry and re-evaluation of patient's condition.   Date: 06/07/2012  Rate: 91  Rhythm: normal sinus rhythm  QRS Axis: normal  Intervals: normal  ST/T Wave abnormalities: normal  Conduction Disutrbances:none  Narrative Interpretation:   Old EKG Reviewed: none available.  Results for orders placed during the hospital encounter of 06/07/12  CBC WITH DIFFERENTIAL      Result Value Range   WBC 7.8  4.0 - 10.5 K/uL   RBC 5.11  3.87 - 5.11 MIL/uL   Hemoglobin 15.9 (*) 12.0 - 15.0 g/dL   HCT 16.1 (*) 09.6 - 04.5 %   MCV 93.5  78.0 - 100.0 fL   MCH  31.1  26.0 - 34.0 pg   MCHC 33.3  30.0 - 36.0 g/dL   RDW 16.1  09.6 - 04.5 %   Platelets 265  150 - 400 K/uL   Neutrophils Relative 81 (*) 43 - 77 %   Neutro Abs 6.4  1.7 - 7.7 K/uL   Lymphocytes Relative 7 (*) 12 - 46 %   Lymphs Abs 0.6 (*) 0.7 - 4.0 K/uL   Monocytes Relative 10  3 - 12 %   Monocytes Absolute 0.8  0.1 - 1.0 K/uL   Eosinophils Relative 1  0 - 5 %   Eosinophils Absolute 0.1  0.0 - 0.7 K/uL   Basophils Relative 0  0 - 1 %   Basophils Absolute 0.0  0.0 - 0.1 K/uL  BASIC METABOLIC PANEL      Result Value Range   Sodium 133 (*) 135 - 145 mEq/L   Potassium 3.7  3.5 - 5.1 mEq/L   Chloride 97  96 - 112 mEq/L   CO2 24  19 - 32 mEq/L   Glucose, Bld 117 (*) 70 - 99 mg/dL   BUN <3 (*) 6 - 23 mg/dL   Creatinine, Ser 4.09 (*) 0.50 - 1.10 mg/dL   Calcium 9.1  8.4 - 81.1 mg/dL   GFR calc non Af Amer >90  >90 mL/min   GFR calc Af Amer >90  >90 mL/min  TROPONIN I      Result Value Range   Troponin I <0.30  <0.30 ng/mL   Dg Chest Portable 1 View 06/07/2012  *RADIOLOGY REPORT*  Clinical Data: Shortness of breath and chest pain.  PORTABLE CHEST  - 1 VIEW  Comparison: CT chest 09/30/2008.  Findings: The lungs are markedly emphysematous.  No focal airspace disease, pneumothorax or effusion is identified.  Heart size is normal.  IMPRESSION: Emphysema without acute disease.   Original Report Authenticated By: Holley Dexter, M.D.             Laray Anger, DO 06/09/12 1239

## 2012-06-08 LAB — CBC
Hemoglobin: 14.9 g/dL (ref 12.0–15.0)
MCH: 31.2 pg (ref 26.0–34.0)
MCHC: 33.3 g/dL (ref 30.0–36.0)
MCV: 93.5 fL (ref 78.0–100.0)

## 2012-06-08 LAB — FERRITIN: Ferritin: 44 ng/mL (ref 10–291)

## 2012-06-08 LAB — BASIC METABOLIC PANEL
BUN: 3 mg/dL — ABNORMAL LOW (ref 6–23)
CO2: 24 mEq/L (ref 19–32)
Calcium: 8.7 mg/dL (ref 8.4–10.5)
GFR calc non Af Amer: >90 mL/min (ref >90)
Glucose, Bld: 171 mg/dL — ABNORMAL HIGH (ref 70–99)
Potassium: 3.6 mEq/L (ref 3.5–5.1)

## 2012-06-08 MED ORDER — FLUTICASONE PROPIONATE 50 MCG/ACT NA SUSP
2.0000 | Freq: Every day | NASAL | Status: DC
  Administered 2012-06-08 – 2012-06-09 (×2): 2 via NASAL
  Filled 2012-06-08: qty 16

## 2012-06-08 MED ORDER — PREDNISONE 20 MG PO TABS
60.0000 mg | ORAL_TABLET | Freq: Two times a day (BID) | ORAL | Status: DC
  Administered 2012-06-08 – 2012-06-10 (×5): 60 mg via ORAL
  Filled 2012-06-08: qty 1
  Filled 2012-06-08 (×2): qty 3
  Filled 2012-06-08: qty 2
  Filled 2012-06-08 (×2): qty 3

## 2012-06-08 MED ORDER — LEVOFLOXACIN 500 MG PO TABS
500.0000 mg | ORAL_TABLET | Freq: Every day | ORAL | Status: DC
  Administered 2012-06-08 – 2012-06-10 (×3): 500 mg via ORAL
  Filled 2012-06-08 (×3): qty 1

## 2012-06-08 NOTE — Progress Notes (Signed)
Chart reviewed.  Subjective: Feels a little better today, but very dyspneic on exertion. Requesting a ferritin level be drawn to monitor her polycythemia. She reportedly had frequent phlebotomy when she lived in [place], and was followed by hematologist there. Coughing quite a bit. Takes nasal saline and nasal topical steroids at home for chronic sinusitis.  Objective: Vital signs in last 24 hours: Filed Vitals:   06/07/12 2344 06/08/12 0346 06/08/12 0620 06/08/12 0730  BP:   146/84   Pulse:   90   Temp:   97.5 F (36.4 C)   TempSrc:   Oral   Resp:   20   Height:      Weight:      SpO2: 92% 95% 94% 94%   Weight change:   Intake/Output Summary (Last 24 hours) at 06/08/12 1239 Last data filed at 06/08/12 0800  Gross per 24 hour  Intake 1967.5 ml  Output      0 ml  Net 1967.5 ml   Gen:  Talkative. Eating lunch and watching TV. Frequent coughing bouts. Lungs: Diminished throughout. Rhonchi present. No wheeze. Prolonged expiratory phase. Cardiovascular regular rate rhythm without murmurs gallops rubs Abdomen soft nontender nondistended Extremities no clubbing cyanosis or edema  Lab Results: Basic Metabolic Panel:  Recent Labs Lab 06/07/12 1042 06/08/12 0513  NA 133* 134*  K 3.7 3.6  CL 97 101  CO2 24 24  GLUCOSE 117* 171*  BUN <3* 3*  CREATININE 0.43* 0.37*  CALCIUM 9.1 8.7   Liver Function Tests: No results found for this basename: AST, ALT, ALKPHOS, BILITOT, PROT, ALBUMIN,  in the last 168 hours No results found for this basename: LIPASE, AMYLASE,  in the last 168 hours No results found for this basename: AMMONIA,  in the last 168 hours CBC:  Recent Labs Lab 06/07/12 1042 06/08/12 0513  WBC 7.8 5.7  NEUTROABS 6.4  --   HGB 15.9* 14.9  HCT 47.8* 44.7  MCV 93.5 93.5  PLT 265 269   Cardiac Enzymes:  Recent Labs Lab 06/07/12 1042  TROPONINI <0.30   BNP: No results found for this basename: PROBNP,  in the last 168 hours D-Dimer: No results found  for this basename: DDIMER,  in the last 168 hours CBG: No results found for this basename: GLUCAP,  in the last 168 hours Hemoglobin A1C: No results found for this basename: HGBA1C,  in the last 168 hours Fasting Lipid Panel: No results found for this basename: CHOL, HDL, LDLCALC, TRIG, CHOLHDL, LDLDIRECT,  in the last 168 hours Thyroid Function Tests:  Recent Labs Lab 06/07/12 1042  TSH 1.441   Micro Results: No results found for this or any previous visit (from the past 240 hour(s)). Studies/Results: Dg Chest Portable 1 View  06/07/2012  *RADIOLOGY REPORT*  Clinical Data: Shortness of breath and chest pain.  PORTABLE CHEST - 1 VIEW  Comparison: CT chest 09/30/2008.  Findings: The lungs are markedly emphysematous.  No focal airspace disease, pneumothorax or effusion is identified.  Heart size is normal.  IMPRESSION: Emphysema without acute disease.   Original Report Authenticated By: Holley Dexter, M.D.    Scheduled Meds: . albuterol  2.5 mg Nebulization Q4H  . antiseptic oral rinse  15 mL Mouth Rinse BID  . benzonatate  100 mg Oral TID  . calcium carbonate  1 tablet Oral BID WC  . cholecalciferol  4,000 Units Oral Daily  . dextromethorphan-guaiFENesin  1 tablet Oral Q12H  . docusate sodium  100 mg Oral BID  .  famotidine  20 mg Oral BID  . ipratropium  0.5 mg Nebulization Q4H  . levofloxacin (LEVAQUIN) IV  750 mg Intravenous Q24H  . methylPREDNISolone (SOLU-MEDROL) injection  80 mg Intravenous Q6H  . mometasone-formoterol  2 puff Inhalation BID   Continuous Infusions:  PRN Meds:.acetaminophen, acetaminophen, albuterol, ALPRAZolam, alum & mag hydroxide-simeth, chlorpheniramine-HYDROcodone, ondansetron (ZOFRAN) IV, ondansetron Assessment/Plan: Principal Problem:   COPD with exacerbation with acute bronchitis and chronic sinusitis. Will resume nasal topical steroids and saline. Change IV steroids to by mouth. Change levofloxacin to by mouth. Also, patient failed to improve on 4  days of Avelox, so will add doxycycline. Active Problems:   Acute-on-chronic respiratory failure   Polycythemia: Check ferritin   Hyponatremia Discontinue telemetry.   LOS: 1 day   Kimie Pidcock L 06/08/2012, 12:39 PM

## 2012-06-09 NOTE — Progress Notes (Signed)
Subjective: Feels a little better today, but still quite dyspneic.  Objective: Vital signs in last 24 hours: Filed Vitals:   06/08/12 2138 06/09/12 0035 06/09/12 0547 06/09/12 1116  BP: 158/81  178/91   Pulse: 96  83   Temp: 97.8 F (36.6 C)  97.5 F (36.4 C)   TempSrc: Oral  Oral   Resp: 20  20   Height:      Weight:      SpO2: 96% 93% 96% 96%   Weight change:   Intake/Output Summary (Last 24 hours) at 06/09/12 1213 Last data filed at 06/09/12 0800  Gross per 24 hour  Intake    360 ml  Output      0 ml  Net    360 ml   Gen:  Talkative. Appears winded with talking. Lungs: Diminished throughout. Tight.  Prolonged expiratory phase. No rhonchi. No rales or wheeze. Cardiovascular regular rate rhythm without murmurs gallops rubs Abdomen soft nontender nondistended Extremities no clubbing cyanosis or edema  Lab Results: Basic Metabolic Panel:  Recent Labs Lab 06/07/12 1042 06/08/12 0513  NA 133* 134*  K 3.7 3.6  CL 97 101  CO2 24 24  GLUCOSE 117* 171*  BUN <3* 3*  CREATININE 0.43* 0.37*  CALCIUM 9.1 8.7   Liver Function Tests: No results found for this basename: AST, ALT, ALKPHOS, BILITOT, PROT, ALBUMIN,  in the last 168 hours No results found for this basename: LIPASE, AMYLASE,  in the last 168 hours No results found for this basename: AMMONIA,  in the last 168 hours CBC:  Recent Labs Lab 06/07/12 1042 06/08/12 0513  WBC 7.8 5.7  NEUTROABS 6.4  --   HGB 15.9* 14.9  HCT 47.8* 44.7  MCV 93.5 93.5  PLT 265 269   Cardiac Enzymes:  Recent Labs Lab 06/07/12 1042  TROPONINI <0.30   BNP: No results found for this basename: PROBNP,  in the last 168 hours D-Dimer: No results found for this basename: DDIMER,  in the last 168 hours CBG: No results found for this basename: GLUCAP,  in the last 168 hours Hemoglobin A1C: No results found for this basename: HGBA1C,  in the last 168 hours Fasting Lipid Panel: No results found for this basename: CHOL,  HDL, LDLCALC, TRIG, CHOLHDL, LDLDIRECT,  in the last 168 hours Thyroid Function Tests:  Recent Labs Lab 06/07/12 1042  TSH 1.441   Micro Results: No results found for this or any previous visit (from the past 240 hour(s)). Studies/Results: No results found. Scheduled Meds: . albuterol  2.5 mg Nebulization Q4H  . antiseptic oral rinse  15 mL Mouth Rinse BID  . benzonatate  100 mg Oral TID  . calcium carbonate  1 tablet Oral BID WC  . cholecalciferol  4,000 Units Oral Daily  . dextromethorphan-guaiFENesin  1 tablet Oral Q12H  . docusate sodium  100 mg Oral BID  . famotidine  20 mg Oral BID  . fluticasone  2 spray Each Nare Daily  . ipratropium  0.5 mg Nebulization Q4H  . levofloxacin  500 mg Oral Daily  . mometasone-formoterol  2 puff Inhalation BID  . predniSONE  60 mg Oral BID   Continuous Infusions:  PRN Meds:.acetaminophen, acetaminophen, albuterol, ALPRAZolam, alum & mag hydroxide-simeth, chlorpheniramine-HYDROcodone, ondansetron (ZOFRAN) IV, ondansetron Assessment/Plan: Principal Problem:   COPD with exacerbation with acute bronchitis and chronic sinusitis.  nasal topical steroids and saline, abx, steroids, HHN.    Acute-on-chronic respiratory failure   Polycythemia: ferritin normal  Hyponatremia Too dyspneic for discharge yet.   LOS: 2 days   Leah Dalton L 06/09/2012, 12:13 PM

## 2012-06-10 MED ORDER — LEVOFLOXACIN 500 MG PO TABS: 500.0000 mg | ORAL_TABLET | Freq: Every day | ORAL | Status: DC

## 2012-06-10 MED ORDER — BENZONATATE 100 MG PO CAPS: 100.0000 mg | ORAL_CAPSULE | Freq: Three times a day (TID) | ORAL | Status: DC

## 2012-06-10 MED ORDER — PREDNISONE 20 MG PO TABS: ORAL_TABLET | ORAL | Status: DC

## 2012-06-10 MED ORDER — FLUTICASONE PROPIONATE 50 MCG/ACT NA SUSP: 2.0000 | Freq: Every day | NASAL | Status: DC

## 2012-06-10 MED ORDER — ALPRAZOLAM 0.5 MG PO TABS: 0.5000 mg | ORAL_TABLET | Freq: Three times a day (TID) | ORAL | Status: DC | PRN

## 2012-06-10 NOTE — Discharge Summary (Signed)
Physician Discharge Summary  Leah Dalton EAV:[id number] DOB: May 14, 1945 DOA: 06/07/2012  PCP: Cassell Smiles., MD  Admit date: 06/07/2012 Discharge date: 06/10/2012  Time spent: Greater than 30 minutes  Recommendations for Outpatient Follow-up:  1. Followup with primary care physician within a week or so.  Discharge Diagnoses:  1. COPD exacerbation. Oxygen dependent.   Discharge Condition: Stable.  Diet recommendation: Regular.  Filed Weights   06/07/12 1023 06/07/12 1645 06/07/12 1711  Weight: 68.947 kg (152 lb) 68 kg (149 lb 14.6 oz) 68 kg (149 lb 14.6 oz)    History of present illness:  This 67 year old lady presented to the hospital with symptoms of dyspnea. Please see initial history as outlined below: HPI: Leah Dalton is a 67 y.o. female with a history significant for oxygen-dependent COPD who presents to the emergency department today with a chief complaint of shortness of breath. Her symptoms started approximately 5 days ago. Generally, when her "bronchitis acts up ", she starts to have a burning throat and burning in her chest. These symptoms started 5 days ago. She called her primary care physician, Dr. Sherwood Gambler. He called in a prescription for Avelox and oral albuterol and recommended that she continue her nebulizer treatments. She took Avelox for 4 days, but she became progressively short of breath and developed chest congestion and wheezing. She started coughing with production of yellow sputum. She has nasal congestion. She had one episode of posttussive emesis. She denies subjective fever or chills. She denies abdominal pain. She denies pleurisy. She denies swelling in her legs. She quit smoking in October 2012.  In the emergency department, she is afebrile and mildly tachycardic following a 1 hour continuous albuterol nebulizer per ED physician. Her chest x-ray reveals emphysema with no acute changes. Her lab data are significant for a serum sodium of 133, hemoglobin  of 15.9, and glucose of 117. Her EKG reveals normal sinus rhythm with a heart rate of 91 beats per minute and nonspecific T-wave abnormalities. She is being admitted for further evaluation and management.  Hospital Course:  Patient was admitted and started on steroids, bronchodilators and antibiotics. She has made gradual improvement and feels like she is probably back to her baseline which he is dyspneic on minimal exertion. She quit smoking approximately 2 years ago but had previously been smoking for decades. She will benefit from home oxygen. We will see if she qualifies prior to discharge. If she does, we will set her up for home oxygen. She did have home oxygen when she was living in [place]. She will need a tapering course of prednisone and also for the one week course of antibiotic. Chest x-ray did not show any presence of pneumonia. She does cough up yellow sputum every day. She has had no fever. She is now stable for discharge.  Procedures:  None.   Consultations:  None.  Discharge Exam: Filed Vitals:   06/09/12 1928 06/09/12 2106 06/10/12 0618 06/10/12 0720  BP:  147/93 167/98   Pulse:  98 82   Temp:  97.4 F (36.3 C) 97.9 F (36.6 C)   TempSrc:  Oral Oral   Resp:  20 20   Height:      Weight:      SpO2: 92% 94%  93%    General: She looks chronically sick but acutely nontoxic. There is some work of breathing at rest, this is likely her baseline. There is no peripheral or central cyanosis. Cardiovascular: Heart sounds are present and in sinus rhythm.  No murmurs or gallop rhythm. Respiratory: Lung fields show reduced air entry bilaterally, consistent with emphysema. There is no wheezing whatsoever. There is no crackles or bronchial breathing. She is alert and orientated.  Discharge Instructions  Discharge Orders   Future Orders Complete By Expires     Diet - low sodium heart healthy  As directed     Increase activity slowly  As directed         Medication List     TAKE these medications       albuterol 108 (90 BASE) MCG/ACT inhaler  Commonly known as:  PROVENTIL HFA;VENTOLIN HFA  Inhale 2 puffs into the lungs every 6 (six) hours as needed for wheezing.     albuterol (2.5 MG/3ML) 0.083% nebulizer solution  Commonly known as:  PROVENTIL  Take 2.5 mg by nebulization 3 (three) times daily.     ALPRAZolam 0.5 MG tablet  Commonly known as:  XANAX  Take 1 tablet (0.5 mg total) by mouth 3 (three) times daily as needed for anxiety or sleep.     benzonatate 100 MG capsule  Commonly known as:  TESSALON  Take 1 capsule (100 mg total) by mouth 3 (three) times daily.     calcium carbonate 600 MG Tabs  Commonly known as:  OS-CAL  Take 600 mg by mouth 2 (two) times daily with a meal.     dextromethorphan-guaiFENesin 30-600 MG per 12 hr tablet  Commonly known as:  MUCINEX DM  Take 1 tablet by mouth every 12 (twelve) hours.     famotidine 10 MG chewable tablet  Commonly known as:  PEPCID AC  Chew 10 mg by mouth 2 (two) times daily.     fluticasone 50 MCG/ACT nasal spray  Commonly known as:  FLONASE  Place 2 sprays into the nose daily.     Fluticasone-Salmeterol 250-50 MCG/DOSE Aepb  Commonly known as:  ADVAIR  Inhale 1 puff into the lungs every 12 (twelve) hours.     ipratropium 0.02 % nebulizer solution  Commonly known as:  ATROVENT  Take 500 mcg by nebulization 3 (three) times daily.     levofloxacin 500 MG tablet  Commonly known as:  LEVAQUIN  Take 1 tablet (500 mg total) by mouth daily.     predniSONE 20 MG tablet  Commonly known as:  DELTASONE  Take 2 tablets daily for 3 days, then 1 tablet daily for 3 days, then half tablet daily for 3 days, then STOP.     Vitamin D-3 5000 UNITS Tabs  Take 1 tablet by mouth daily.          The results of significant diagnostics from this hospitalization (including imaging, microbiology, ancillary and laboratory) are listed below for reference.    Significant Diagnostic Studies: Dg Chest  Portable 1 View  06/07/2012  *RADIOLOGY REPORT*  Clinical Data: Shortness of breath and chest pain.  PORTABLE CHEST - 1 VIEW  Comparison: CT chest 09/30/2008.  Findings: The lungs are markedly emphysematous.  No focal airspace disease, pneumothorax or effusion is identified.  Heart size is normal.  IMPRESSION: Emphysema without acute disease.   Original Report Authenticated By: Holley Dexter, M.D.         Labs: Basic Metabolic Panel:  Recent Labs Lab 06/07/12 1042 06/08/12 0513  NA 133* 134*  K 3.7 3.6  CL 97 101  CO2 24 24  GLUCOSE 117* 171*  BUN <3* 3*  CREATININE 0.43* 0.37*  CALCIUM 9.1 8.7  CBC:  Recent Labs Lab 06/07/12 1042 06/08/12 0513  WBC 7.8 5.7  NEUTROABS 6.4  --   HGB 15.9* 14.9  HCT 47.8* 44.7  MCV 93.5 93.5  PLT 265 269   Cardiac Enzymes:  Recent Labs Lab 06/07/12 1042  TROPONINI <0.30       Signed:  GOSRANI,NIMISH C  [facility] 06/10/2012, 9:41 AM

## 2012-06-10 NOTE — Care Management Note (Signed)
    Page 1 of 1   06/10/2012     10:49:52 AM   CARE MANAGEMENT NOTE 06/10/2012  Patient:  Leah Dalton, Leah Dalton   Account Number:  [id number]  Date Initiated:  06/10/2012  Documentation initiated by:  Rosemary Holms  Subjective/Objective Assessment:   [place] Has re-established PCP with [facility]. Pt will need home O2 which she chose [facility]. All documents faxed to [facility] with Pt's approval. Pt declined HH.     Action/Plan:   Anticipated DC Date:  06/10/2012   Anticipated DC Plan:  HOME/SELF CARE      DC Planning Services  CM consult      PAC Choice  DURABLE MEDICAL EQUIPMENT   Choice offered to / List presented to:  C-1 Patient   DME arranged  [facility]      DME agency  [facility]        Status of service:  Completed, signed off Medicare Important Message given?  YES (If response is "NO", the following Medicare IM given date fields will be blank) Date Medicare IM given:  06/10/2012 Date Additional Medicare IM given:    Discharge Disposition:  HOME/SELF CARE  Per UR Regulation:    If discussed at Long Length of Stay Meetings, dates discussed:    Comments:  06/10/12 1035 Leah Meskill RN BSN CM Pt to DC with O2 from [facility]. Declined HH.

## 2012-06-10 NOTE — Progress Notes (Signed)
Pt. D/c instructions reviewed, prescriptions reviewed, IV removed, education on d/c instructions provided. Pt. Assisted to front door in wheelchair. Pt. Stable for d/c at this time. Pt. D/c with plans for home health and home O2 at [facility].

## 2012-06-10 NOTE — Progress Notes (Signed)
Pt. At rest on RA to test O2. Pt. Desaturated to 87%.

## 2012-06-10 NOTE — Progress Notes (Signed)
UR Chart Review Completed  

## 2012-06-21 DIAGNOSIS — J441 Chronic obstructive pulmonary disease with (acute) exacerbation: Secondary | ICD-10-CM | POA: Diagnosis not present

## 2012-06-21 DIAGNOSIS — R609 Edema, unspecified: Secondary | ICD-10-CM | POA: Diagnosis not present

## 2012-06-21 DIAGNOSIS — Z6826 Body mass index (BMI) 26.0-26.9, adult: Secondary | ICD-10-CM | POA: Diagnosis not present

## 2012-08-06 DIAGNOSIS — H2510 Age-related nuclear cataract, unspecified eye: Secondary | ICD-10-CM | POA: Diagnosis not present

## 2012-12-25 DIAGNOSIS — Z23 Encounter for immunization: Secondary | ICD-10-CM | POA: Diagnosis not present

## 2013-01-13 DIAGNOSIS — J449 Chronic obstructive pulmonary disease, unspecified: Secondary | ICD-10-CM | POA: Diagnosis not present

## 2013-03-12 DIAGNOSIS — E319 Polyglandular dysfunction, unspecified: Secondary | ICD-10-CM | POA: Diagnosis not present

## 2013-03-12 DIAGNOSIS — D510 Vitamin B12 deficiency anemia due to intrinsic factor deficiency: Secondary | ICD-10-CM | POA: Diagnosis not present

## 2013-04-30 DIAGNOSIS — R0902 Hypoxemia: Secondary | ICD-10-CM | POA: Diagnosis not present

## 2013-04-30 DIAGNOSIS — J449 Chronic obstructive pulmonary disease, unspecified: Secondary | ICD-10-CM | POA: Diagnosis not present

## 2013-05-10 DIAGNOSIS — H9209 Otalgia, unspecified ear: Secondary | ICD-10-CM | POA: Diagnosis not present

## 2013-05-10 DIAGNOSIS — R112 Nausea with vomiting, unspecified: Secondary | ICD-10-CM | POA: Diagnosis not present

## 2013-06-03 DIAGNOSIS — Z1231 Encounter for screening mammogram for malignant neoplasm of breast: Secondary | ICD-10-CM | POA: Diagnosis not present

## 2013-07-09 DIAGNOSIS — J309 Allergic rhinitis, unspecified: Secondary | ICD-10-CM | POA: Diagnosis not present

## 2013-07-09 DIAGNOSIS — R609 Edema, unspecified: Secondary | ICD-10-CM | POA: Diagnosis not present

## 2013-07-09 DIAGNOSIS — J449 Chronic obstructive pulmonary disease, unspecified: Secondary | ICD-10-CM | POA: Diagnosis not present

## 2013-11-12 DIAGNOSIS — G589 Mononeuropathy, unspecified: Secondary | ICD-10-CM | POA: Diagnosis not present

## 2013-11-12 DIAGNOSIS — J449 Chronic obstructive pulmonary disease, unspecified: Secondary | ICD-10-CM | POA: Diagnosis not present

## 2013-12-30 DIAGNOSIS — Z23 Encounter for immunization: Secondary | ICD-10-CM | POA: Diagnosis not present

## 2014-02-02 DIAGNOSIS — J449 Chronic obstructive pulmonary disease, unspecified: Secondary | ICD-10-CM | POA: Diagnosis not present

## 2014-02-02 DIAGNOSIS — I87303 Chronic venous hypertension (idiopathic) without complications of bilateral lower extremity: Secondary | ICD-10-CM | POA: Diagnosis not present

## 2014-02-22 ENCOUNTER — Encounter (HOSPITAL_COMMUNITY): Payer: Self-pay | Admitting: Emergency Medicine

## 2014-02-22 ENCOUNTER — Inpatient Hospital Stay (HOSPITAL_COMMUNITY)
Admission: EM | Admit: 2014-02-22 | Discharge: 2014-02-24 | DRG: 811 | Disposition: A | Discharge status: Home / Self Care | Payer: Medicare Other | Attending: Pulmonary Disease | Admitting: Pulmonary Disease

## 2014-02-22 ENCOUNTER — Emergency Department (HOSPITAL_COMMUNITY): Payer: Medicare Other

## 2014-02-22 DIAGNOSIS — Z90710 Acquired absence of both cervix and uterus: Secondary | ICD-10-CM

## 2014-02-22 DIAGNOSIS — Z880 Allergy status to penicillin: Secondary | ICD-10-CM

## 2014-02-22 DIAGNOSIS — K648 Other hemorrhoids: Secondary | ICD-10-CM | POA: Diagnosis present

## 2014-02-22 DIAGNOSIS — J449 Chronic obstructive pulmonary disease, unspecified: Secondary | ICD-10-CM | POA: Diagnosis present

## 2014-02-22 DIAGNOSIS — R739 Hyperglycemia, unspecified: Secondary | ICD-10-CM | POA: Diagnosis not present

## 2014-02-22 DIAGNOSIS — R0602 Shortness of breath: Secondary | ICD-10-CM | POA: Diagnosis not present

## 2014-02-22 DIAGNOSIS — K2100 Gastro-esophageal reflux disease with esophagitis, without bleeding: Secondary | ICD-10-CM | POA: Diagnosis present

## 2014-02-22 DIAGNOSIS — D6489 Other specified anemias: Secondary | ICD-10-CM | POA: Diagnosis present

## 2014-02-22 DIAGNOSIS — K644 Residual hemorrhoidal skin tags: Secondary | ICD-10-CM | POA: Diagnosis present

## 2014-02-22 DIAGNOSIS — Z9981 Dependence on supplemental oxygen: Secondary | ICD-10-CM

## 2014-02-22 DIAGNOSIS — K5730 Diverticulosis of large intestine without perforation or abscess without bleeding: Secondary | ICD-10-CM | POA: Diagnosis present

## 2014-02-22 DIAGNOSIS — Z87891 Personal history of nicotine dependence: Secondary | ICD-10-CM

## 2014-02-22 DIAGNOSIS — D751 Secondary polycythemia: Secondary | ICD-10-CM | POA: Diagnosis present

## 2014-02-22 DIAGNOSIS — J9620 Acute and chronic respiratory failure, unspecified whether with hypoxia or hypercapnia: Secondary | ICD-10-CM | POA: Diagnosis present

## 2014-02-22 DIAGNOSIS — K2210 Ulcer of esophagus without bleeding: Secondary | ICD-10-CM | POA: Diagnosis present

## 2014-02-22 DIAGNOSIS — D509 Iron deficiency anemia, unspecified: Secondary | ICD-10-CM | POA: Diagnosis present

## 2014-02-22 DIAGNOSIS — K922 Gastrointestinal hemorrhage, unspecified: Secondary | ICD-10-CM | POA: Diagnosis not present

## 2014-02-22 DIAGNOSIS — Z888 Allergy status to other drugs, medicaments and biological substances status: Secondary | ICD-10-CM | POA: Diagnosis not present

## 2014-02-22 DIAGNOSIS — R062 Wheezing: Secondary | ICD-10-CM

## 2014-02-22 DIAGNOSIS — D62 Acute posthemorrhagic anemia: Secondary | ICD-10-CM | POA: Diagnosis not present

## 2014-02-22 DIAGNOSIS — D649 Anemia, unspecified: Secondary | ICD-10-CM | POA: Diagnosis present

## 2014-02-22 DIAGNOSIS — K449 Diaphragmatic hernia without obstruction or gangrene: Secondary | ICD-10-CM | POA: Diagnosis present

## 2014-02-22 DIAGNOSIS — M810 Age-related osteoporosis without current pathological fracture: Secondary | ICD-10-CM | POA: Diagnosis present

## 2014-02-22 DIAGNOSIS — Q2733 Arteriovenous malformation of digestive system vessel: Secondary | ICD-10-CM

## 2014-02-22 DIAGNOSIS — Z7982 Long term (current) use of aspirin: Secondary | ICD-10-CM | POA: Diagnosis not present

## 2014-02-22 DIAGNOSIS — M25511 Pain in right shoulder: Secondary | ICD-10-CM | POA: Diagnosis not present

## 2014-02-22 LAB — COMPREHENSIVE METABOLIC PANEL
ALBUMIN: 3.2 g/dL — AB (ref 3.5–5.2)
ALK PHOS: 134 U/L — AB (ref 39–117)
ALT: 8 U/L (ref 0–35)
AST: 11 U/L (ref 0–37)
Anion gap: 10 (ref 5–15)
BUN: 6 mg/dL (ref 6–23)
CO2: 29 mEq/L (ref 19–32)
Calcium: 9.2 mg/dL (ref 8.4–10.5)
Chloride: 98 mEq/L (ref 96–112)
Creatinine, Ser: 0.65 mg/dL (ref 0.50–1.10)
GFR calc Af Amer: >90 mL/min (ref >90)
GFR calc non Af Amer: 89 mL/min — ABNORMAL LOW (ref >90)
GLUCOSE: 133 mg/dL — AB (ref 70–99)
POTASSIUM: 4 meq/L (ref 3.7–5.3)
SODIUM: 137 meq/L (ref 137–147)
TOTAL PROTEIN: 6.5 g/dL (ref 6.0–8.3)
Total Bilirubin: 0.4 mg/dL (ref 0.3–1.2)

## 2014-02-22 LAB — PREPARE RBC (CROSSMATCH)

## 2014-02-22 LAB — CBC WITH DIFFERENTIAL/PLATELET
BASOS PCT: 1 % (ref 0–1)
Basophils Absolute: 0.1 10*3/uL (ref 0.0–0.1)
EOS ABS: 0.3 10*3/uL (ref 0.0–0.7)
Eosinophils Relative: 4 % (ref 0–5)
HEMATOCRIT: 23.4 % — AB (ref 36.0–46.0)
HEMOGLOBIN: 6.5 g/dL — AB (ref 12.0–15.0)
Lymphocytes Relative: 13 % (ref 12–46)
Lymphs Abs: 1.1 10*3/uL (ref 0.7–4.0)
MCH: 20 pg — AB (ref 26.0–34.0)
MCHC: 27.8 g/dL — AB (ref 30.0–36.0)
MCV: 72 fL — ABNORMAL LOW (ref 78.0–100.0)
MONO ABS: 0.8 10*3/uL (ref 0.1–1.0)
Monocytes Relative: 9 % (ref 3–12)
NEUTROS ABS: 6.1 10*3/uL (ref 1.7–7.7)
Neutrophils Relative %: 73 % (ref 43–77)
Platelets: 661 10*3/uL — ABNORMAL HIGH (ref 150–400)
RBC: 3.25 MIL/uL — ABNORMAL LOW (ref 3.87–5.11)
RDW: 19.8 % — ABNORMAL HIGH (ref 11.5–15.5)
WBC: 8.4 10*3/uL (ref 4.0–10.5)

## 2014-02-22 LAB — HEMOGLOBIN
Hemoglobin: 8.5 g/dL — ABNORMAL LOW (ref 12.0–15.0)
Hemoglobin: 9.2 g/dL — ABNORMAL LOW (ref 12.0–15.0)

## 2014-02-22 LAB — ABO/RH: ABO/RH(D): A POS

## 2014-02-22 LAB — TROPONIN I: Troponin I: <0.3 ng/mL (ref <0.30)

## 2014-02-22 LAB — OCCULT BLOOD, POC DEVICE: FECAL OCCULT BLD: POSITIVE — AB

## 2014-02-22 MED ORDER — ONDANSETRON HCL 4 MG/2ML IJ SOLN
4.0000 mg | Freq: Four times a day (QID) | INTRAMUSCULAR | Status: DC | PRN
  Administered 2014-02-22 – 2014-02-24 (×3): 4 mg via INTRAVENOUS
  Filled 2014-02-22 (×3): qty 2

## 2014-02-22 MED ORDER — PEG 3350-KCL-NA BICARB-NACL 420 G PO SOLR
2000.0000 mL | Freq: Once | ORAL | Status: AC
  Administered 2014-02-23: 2000 mL via ORAL

## 2014-02-22 MED ORDER — SODIUM CHLORIDE 0.9 % IV SOLN
INTRAVENOUS | Status: DC
  Administered 2014-02-23 – 2014-02-24 (×2): via INTRAVENOUS

## 2014-02-22 MED ORDER — SODIUM CHLORIDE 0.9 % IV SOLN
INTRAVENOUS | Status: AC
Start: 2014-02-22 — End: 2014-02-23

## 2014-02-22 MED ORDER — SODIUM CHLORIDE 0.9 % IJ SOLN
3.0000 mL | Freq: Two times a day (BID) | INTRAMUSCULAR | Status: DC
  Administered 2014-02-23: 3 mL via INTRAVENOUS

## 2014-02-22 MED ORDER — IPRATROPIUM BROMIDE 0.02 % IN SOLN: 500.0000 ug | Freq: Three times a day (TID) | RESPIRATORY_TRACT | Status: DC

## 2014-02-22 MED ORDER — FUROSEMIDE 40 MG PO TABS
40.0000 mg | ORAL_TABLET | Freq: Every day | ORAL | Status: DC
  Administered 2014-02-22 – 2014-02-23 (×2): 40 mg via ORAL
  Filled 2014-02-22 (×3): qty 1

## 2014-02-22 MED ORDER — PEG 3350-KCL-NA BICARB-NACL 420 G PO SOLR
2000.0000 mL | Freq: Once | ORAL | Status: AC
  Administered 2014-02-22: 2000 mL via ORAL
  Filled 2014-02-22: qty 4000

## 2014-02-22 MED ORDER — PANTOPRAZOLE SODIUM 40 MG IV SOLR
40.0000 mg | INTRAVENOUS | Status: DC
  Administered 2014-02-22 – 2014-02-23 (×2): 40 mg via INTRAVENOUS
  Filled 2014-02-22 (×2): qty 40

## 2014-02-22 MED ORDER — SODIUM CHLORIDE 0.9 % IV SOLN
Freq: Once | INTRAVENOUS | Status: AC
  Administered 2014-02-22: 10:00:00 via INTRAVENOUS

## 2014-02-22 MED ORDER — MOMETASONE FURO-FORMOTEROL FUM 100-5 MCG/ACT IN AERO
2.0000 | INHALATION_SPRAY | Freq: Two times a day (BID) | RESPIRATORY_TRACT | Status: DC
  Administered 2014-02-22 – 2014-02-24 (×4): 2 via RESPIRATORY_TRACT
  Filled 2014-02-22: qty 8.8

## 2014-02-22 MED ORDER — ONDANSETRON HCL 4 MG PO TABS: 4.0000 mg | ORAL_TABLET | Freq: Four times a day (QID) | ORAL | Status: DC | PRN

## 2014-02-22 MED ORDER — SODIUM CHLORIDE 0.9 % IV SOLN: INTRAVENOUS | Status: DC

## 2014-02-22 MED ORDER — ALBUTEROL SULFATE (2.5 MG/3ML) 0.083% IN NEBU: 2.5000 mg | INHALATION_SOLUTION | Freq: Three times a day (TID) | RESPIRATORY_TRACT | Status: DC

## 2014-02-22 MED ORDER — IPRATROPIUM-ALBUTEROL 0.5-2.5 (3) MG/3ML IN SOLN
3.0000 mL | Freq: Three times a day (TID) | RESPIRATORY_TRACT | Status: DC
  Administered 2014-02-22 – 2014-02-24 (×5): 3 mL via RESPIRATORY_TRACT
  Filled 2014-02-22 (×5): qty 3

## 2014-02-22 NOTE — Consult Note (Signed)
Referring Provider: No ref. provider found Primary Care Physician:  Alonza Bogus, MD Primary Gastroenterologist:  Dr. Gala Romney  Reason for Consultation:  Anemia; heme positive stool  HPI: 68 year old lady with O2 dependent COPD admitted through the emergency department this morning with progressive weakness and dyspnea. She was found to be significantly anemic with a hemoglobin of 6.5 and Hemoccult positive. She is hemodynamically stable.  Patient denies melena, hematochezia or hematemesis. Has long-standing GERD almost every day for years for which he takes Pepcid complete. No dysphagia or odynophagia. Denies abdominal pain and early satiety or change in weight.  No prior gastrointestinal diseases (although I note that she was found to have diverticulosis with small abscess on 2002 CT scan. It is notable that her liver appeared normal at that time). She's been offered colonoscopy multiple times in the past but has declined. No prior EGD. Positive family history of precancerous polyps in her father which required surveillance colonoscopies at short interval.  Is is mentioned in the record that she has a diagnosis of polycythemia. She describes undergoing phlebotomies under the direction of Dr. Tressie Stalker up until about 5 years ago when she moved to [place]. 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  Her last ferritin in the chart was 1 year ago at 40; in 2010 was elevated at 436. Her last hemoglobin in the medical record was 14.9 back in March 2014.    Past Medical History  Diagnosis Date  . COPD (chronic obstructive pulmonary disease)     O2 dependent, started 05/31/2012  . Polycythemia     Has had to have phlebotomies in the pas  . History of tobacco abuse   . Osteoporosis     Past Surgical History  Procedure Laterality Date  . Abdominal hysterectomy    . Cataract extraction      Prior to Admission medications     Medication Sig Start Date End Date Taking? Authorizing Provider  albuterol (PROVENTIL HFA;VENTOLIN HFA) 108 (90 BASE) MCG/ACT inhaler Inhale 2 puffs into the lungs every 6 (six) hours as needed for wheezing.   Yes Historical Provider, MD  albuterol (PROVENTIL) (2.5 MG/3ML) 0.083% nebulizer solution Take 2.5 mg by nebulization 3 (three) times daily.   Yes Historical Provider, MD  aspirin EC 81 MG tablet Take 81 mg by mouth daily.   Yes Historical Provider, MD  calcium carbonate (OS-CAL) 600 MG TABS Take 600 mg by mouth 2 (two) times daily with a meal.   Yes Historical Provider, MD  Cholecalciferol (VITAMIN D-3) 5000 UNITS TABS Take 1 tablet by mouth daily.   Yes Historical Provider, MD  famotidine (PEPCID AC) 10 MG chewable tablet Chew 10 mg by mouth 2 (two) times daily.   Yes Historical Provider, MD  Fluticasone-Salmeterol (ADVAIR) 250-50 MCG/DOSE AEPB Inhale 1 puff into the lungs every 12 (twelve) hours.   Yes Historical Provider, MD  furosemide (LASIX) 40 MG tablet Take 40 mg by mouth daily.   Yes Historical Provider, MD  ipratropium (ATROVENT) 0.02 % nebulizer solution Take 500 mcg by nebulization 3 (three) times daily.   Yes Historical Provider, MD  ALPRAZolam Duanne Moron) 0.5 MG tablet Take 1 tablet (0.5 mg total) by mouth 3 (three) times daily as needed for anxiety or sleep. Patient not taking: Reported on 02/22/2014 06/10/12   Doree Albee, MD  benzonatate (TESSALON) 100 MG capsule Take 1 capsule (100 mg total) by mouth 3 (three) times daily. Patient not  taking: Reported on 02/22/2014 06/10/12   Doree Albee, MD  dextromethorphan-guaiFENesin (MUCINEX DM) 30-600 MG per 12 hr tablet Take 1 tablet by mouth every 12 (twelve) hours.    Historical Provider, MD  fluticasone (FLONASE) 50 MCG/ACT nasal spray Place 2 sprays into the nose daily. Patient not taking: Reported on 02/22/2014 06/10/12   Doree Albee, MD  levofloxacin (LEVAQUIN) 500 MG tablet Take 1 tablet (500 mg total) by mouth  daily. Patient not taking: Reported on 02/22/2014 06/10/12   Doree Albee, MD  predniSONE (DELTASONE) 20 MG tablet Take 2 tablets daily for 3 days, then 1 tablet daily for 3 days, then half tablet daily for 3 days, then STOP. Patient not taking: Reported on 02/22/2014 06/10/12   Doree Albee, MD    Current Facility-Administered Medications  Medication Dose Route Frequency Provider Last Rate Last Dose  . 0.9 %  sodium chloride infusion   Intravenous Continuous Donne Hazel, MD      . furosemide (LASIX) tablet 40 mg  40 mg Oral Daily Donne Hazel, MD      . ipratropium-albuterol (DUONEB) 0.5-2.5 (3) MG/3ML nebulizer solution 3 mL  3 mL Nebulization TID Donne Hazel, MD      . mometasone-formoterol [facility]) 100-5 MCG/ACT inhaler 2 puff  2 puff Inhalation BID Donne Hazel, MD      . ondansetron [facility]) tablet 4 mg  4 mg Oral Q6H PRN Donne Hazel, MD       Or  . ondansetron [facility]) injection 4 mg  4 mg Intravenous Q6H PRN Donne Hazel, MD      . pantoprazole (PROTONIX) injection 40 mg  40 mg Intravenous Q24H Donne Hazel, MD      . sodium chloride 0.9 % injection 3 mL  3 mL Intravenous Q12H Donne Hazel, MD        Allergies as of 02/22/2014 - Review Complete 02/22/2014  Allergen Reaction Noted  . Codeine Itching 06/07/2012  . Daliresp [roflumilast] Diarrhea 06/07/2012  . Keflex [cephalexin] Other (See Comments) 06/07/2012  . Penicillins Itching 06/07/2012    History reviewed. No pertinent family history.  History   Social History  . Marital Status: Divorced    Spouse Name: N/A    Number of Children: N/A  . Years of Education: N/A   Occupational History  . Not on file.   Social History Main Topics  . Smoking status: Former Research scientist (life sciences)  . Smokeless tobacco: Not on file  . Alcohol Use: Yes     Comment: 3 beer nightly  . Drug Use: No  . Sexual Activity: Not on file   Other Topics Concern  . Not on file   Social History Narrative   social history: Drinks  2-3 Miller light beers every night. No illicit drugs.  Review of Systems: Gen: Denies any fever, chills, sweats, anorexia, , weight loss, and sleep disorder CV: Denies chest pain, angina, palpitations, syncope, orthopnea, PND, peripheral edema, and claudication. Resp: , coughing up blood, and pleurisy. GI: Denies vomiting blood, jaundice, and fecal incontinence.   Denies dysphagia or odynophagia. Derm: Denies rash, itching, dry skin, hives, moles, warts, or unhealing ulcers.  Psych: Denies depression, anxiety, memory loss, suicidal ideation, hallucinations, paranoia, and confusion. Heme: Denies bruising, bleeding, and enlarged lymph nodes.   Physical Exam: Vital signs in last 24 hours: Temp:  [97.7 F (36.5 C)-98.4 F (36.9 C)] 98.4 F (36.9 C) (11/29 1125) Pulse Rate:  [83-97] 89 (11/29 1125)  Resp:  [12-25] 16 (11/29 1100) BP: (122-181)/(60-86) 181/70 mmHg (11/29 1125) SpO2:  [94 %-100 %] 94 % (11/29 1125) Weight:  [157 lb (71.215 kg)-157 lb 8 oz (71.442 kg)] 157 lb 8 oz (71.442 kg) (11/29 1125)   General:   Alert, somewhat chronically ill-appearing lady who appears in no acute distress whatsoever. She is accompanied by her son, , Kenlie Seki  Normocephalic and atraumatic. Eyes:  Sclera clear, no icterus.   Conjunctiva pink. Ears:  Normal auditory acuity. Nose:  No deformity, discharge,  or lesions. Mouth:  No deformity or lesions, dentition normal. Neck:  Supple; no masses or thyromegaly. Lungs:  Clear  with distant breath sounds throughout both lung fields.   Heart:  Regular rate and rhythm; no murmurs, clicks, rubs,  or gallops. Abdomen: Nondistended. Positive bowel sounds.  Abdomen is soft and nontender without appreciable mass or organomegaly, without guarding, and without rebound.   Rectal:  Deferred until time of colonoscopy.   Msk:  Symmetrical without gross deformities. Normal posture. Pulses:  Normal pulses noted. Extremities:  Without clubbing or  edema. Neurologic:  Alert and  oriented x4;  grossly normal neurologically. Skin:  Intact without significant lesions or rashes. Cervical Nodes:  No significant cervical adenopathy. Psych:  Alert and cooperative. Normal mood and affect.  Intake/Output from previous day:   Intake/Output this shift: Total I/O In: 250 [I.V.:250] Out: -   Lab Results:  Recent Labs  02/22/14 0908  WBC 8.4  HGB 6.5*  HCT 23.4*  PLT 661*   BMET  Recent Labs  02/22/14 0908  NA 137  K 4.0  CL 98  CO2 29  GLUCOSE 133*  BUN 6  CREATININE 0.65  CALCIUM 9.2   LFT  Recent Labs  02/22/14 0908  PROT 6.5  ALBUMIN 3.2*  AST 11  ALT 8  ALKPHOS 134*  BILITOT 0.4   Studies/Results: Dg Chest 2 View  02/22/2014   CLINICAL DATA:  Shortness of breath, right scapular pain  EXAM: CHEST  2 VIEW  COMPARISON:  06/07/2012  FINDINGS: Chronic interstitial markings/emphysematous changes. No focal consolidation. No pleural effusion or pneumothorax.  The heart is normal in size. Mild prominence the right perihilar region, likely vascular.  Visualized osseous structures are within normal limits.  IMPRESSION: No evidence of acute cardiopulmonary disease.   Electronically Signed   By: Julian Hy M.D.   On: 02/22/2014 10:53   Impression:  Pleasant 68 year old lady with COPD admitted hospital progressive dyspnea weakness and a hemoglobin of 6.4 and microcytic indices. She is Hemoccult positive. Positive family history of precancerous polyps in a first-degree relative. Long-standing, daily GERD no prior upper GI tract evaluation or colonoscopy. Moreover, patient is homozygous for the major hemachromatosis mutation. Ferritin previously elevated; previously underwent phlebotomies. More recently, ferritin was normal with process in evolution likely producing iron deficiency anemia. She could easily have an occult colon cancer or other significant pathology anywhere along her GI tract producing the  Current clinical  picture. She may well have hemachromatosis. The process leading to her anemia and current presentation may actually be protecting her as far as iron overload is concerned. Will look into the issue of hemachromatosis further once she is over this acute illness.  Recommendations: I have offered this lady both a diagnostic EGD and colonoscopy tomorrow.The risks, benefits, limitations, imponderables and alternatives regarding both EGD and colonoscopy have been reviewed with the patient. Questions have been answered. All parties agreeable.  Fully agree with 2 unit transfusion of packed  red blood cells and PPI therapy empirically.   Further recommendations to follow.   Notice:  This dictation was prepared with Dragon dictation along with smaller phrase technology. Any transcriptional errors that result from this process are unintentional and may not be corrected upon review.

## 2014-02-22 NOTE — ED Provider Notes (Signed)
CSN: [id number]     Arrival date & time 02/22/14  0175 History  This chart was scribed for Orlie Dakin, MD by [facility], ED Scribe. The patient was seen in [place] and the patient's care was started at 8:49 AM.  Chief Complaint  Patient presents with  . Shortness of Breath   Patient is a 68 y.o. female presenting with shortness of breath. The history is provided by the patient. No language interpreter was used.  Shortness of Breath Associated symptoms: cough   Associated symptoms: no fever     HPI Comments: Leah Dalton is a 68 y.o. female with a history of COPD who presents to the Emergency Department complaining of SOB and right shoulder pain. Her shoulder pain began five days ago and the pain increases with deep breathing or with changing position. No treatment prior to coming here  Pt states it feels like her COPD is flaring up. Pt has nausea and a mild productive cough with yellow sputum as associated symptoms. Pt has been using her nebulizer TID and it has provided mild relief. She denies shortness of breath at present, however states "I don't feel well". No fever no other associated symptoms. No shoulder pain at present. Nothing makes symptoms better or worse She is also on advair and provair. Pt has taken prednisone in the past and she notes it has provided some relief. She denies fever. Pt quit smoking 3 years ago but did smoke for 50 years. Pt is on 2L/min of oxygen at home.  Her right shoulder pain began five days ago and she came in because the pain is worsening. Pt states changing positions worsens the shoulder pain. She says right now her shoulder does not hurt.  Pt does drink EtOH, about 2-3 a day. She is allergic to codeine and penicillin. Codeine give her nausea and penicillin makes her itchy. Keflex causes yeast infection  Past Medical History  Diagnosis Date  . COPD (chronic obstructive pulmonary disease)     O2 dependent, started 05/31/2012  . Polycythemia      Has had to have phlebotomies in the pas  . History of tobacco abuse   . Osteoporosis    Past Surgical History  Procedure Laterality Date  . Abdominal hysterectomy    . Cataract extraction     No family history on file. History  Substance Use Topics  . Smoking status: Former Research scientist (life sciences)  . Smokeless tobacco: Not on file  . Alcohol Use: Yes     Comment: 3 beer nightly   OB History    No data available     Review of Systems  Constitutional: Negative.  Negative for fever.  HENT: Negative.   Respiratory: Positive for cough and shortness of breath.   Cardiovascular: Negative.   Gastrointestinal: Negative.   Musculoskeletal: Positive for arthralgias.       Right shoulder pain  Skin: Negative.   Neurological: Negative.   Psychiatric/Behavioral: Negative.   All other systems reviewed and are negative.   Allergies  Codeine; Daliresp; Keflex; and Penicillins  Home Medications   Prior to Admission medications   Medication Sig Start Date End Date Taking? Authorizing Provider  albuterol (PROVENTIL HFA;VENTOLIN HFA) 108 (90 BASE) MCG/ACT inhaler Inhale 2 puffs into the lungs every 6 (six) hours as needed for wheezing.    Historical Provider, MD  albuterol (PROVENTIL) (2.5 MG/3ML) 0.083% nebulizer solution Take 2.5 mg by nebulization 3 (three) times daily.    Historical Provider, MD  ALPRAZolam Duanne Moron)  0.5 MG tablet Take 1 tablet (0.5 mg total) by mouth 3 (three) times daily as needed for anxiety or sleep. 06/10/12   Nimish Luther Parody, MD  benzonatate (TESSALON) 100 MG capsule Take 1 capsule (100 mg total) by mouth 3 (three) times daily. 06/10/12   Nimish Luther Parody, MD  calcium carbonate (OS-CAL) 600 MG TABS Take 600 mg by mouth 2 (two) times daily with a meal.    Historical Provider, MD  Cholecalciferol (VITAMIN D-3) 5000 UNITS TABS Take 1 tablet by mouth daily.    Historical Provider, MD  dextromethorphan-guaiFENesin (MUCINEX DM) 30-600 MG per 12 hr tablet Take 1 tablet by mouth every  12 (twelve) hours.    Historical Provider, MD  famotidine (PEPCID AC) 10 MG chewable tablet Chew 10 mg by mouth 2 (two) times daily.    Historical Provider, MD  fluticasone (FLONASE) 50 MCG/ACT nasal spray Place 2 sprays into the nose daily. 06/10/12   Nimish Luther Parody, MD  Fluticasone-Salmeterol (ADVAIR) 250-50 MCG/DOSE AEPB Inhale 1 puff into the lungs every 12 (twelve) hours.    Historical Provider, MD  ipratropium (ATROVENT) 0.02 % nebulizer solution Take 500 mcg by nebulization 3 (three) times daily.    Historical Provider, MD  levofloxacin (LEVAQUIN) 500 MG tablet Take 1 tablet (500 mg total) by mouth daily. 06/10/12   Nimish Luther Parody, MD  predniSONE (DELTASONE) 20 MG tablet Take 2 tablets daily for 3 days, then 1 tablet daily for 3 days, then half tablet daily for 3 days, then STOP. 06/10/12   Nimish Luther Parody, MD   There were no vitals taken for this visit. Physical Exam  Constitutional: She appears well-developed and well-nourished. No distress.  HENT:  Head: Normocephalic and atraumatic.  Eyes: Conjunctivae are normal. Pupils are equal, round, and reactive to light.  Neck: Neck supple. No tracheal deviation present. No thyromegaly present.  Cardiovascular: Normal rate and regular rhythm.   No murmur heard. Pulmonary/Chest:  Diffuse rhonchi minimal and extra wheezes speaks in paragraphs no respiratory distress  Abdominal: Soft. Bowel sounds are normal. She exhibits no distension. There is no tenderness.  Genitourinary: Guaiac positive stool.  Rectal Brown stool trace Hemoccult positive  Musculoskeletal: Normal range of motion. She exhibits no edema or tenderness.  Neurological: She is alert. Coordination normal.  Skin: Skin is warm and dry. No rash noted.  Psychiatric: She has a normal mood and affect.  Nursing note and vitals reviewed.   ED Course  Procedures  DIAGNOSTIC STUDIES: Oxygen Saturation is 96% on [place], adequate by my interpretation.    COORDINATION OF CARE: 8:55 AM  Discussed treatment plan with pt at bedside and pt agreed to plan.  Labs Review Labs Reviewed - No data to display  Imaging Review No results found.   EKG Interpretation   Date/Time:  Sunday February 22 2014 09:05:08 EST Ventricular Rate:  85 PR Interval:  200 QRS Duration: 89 QT Interval:  378 QTC Calculation: 449 R Axis:   71 Text Interpretation:  Sinus rhythm Abnormal R-wave progression, early  transition No significant change since last tracing Confirmed by  Zahli Vetsch  MD, Zeek Rostron [id number]) on 02/22/2014 9:10:16 AM     Chest xray viewed by me  10:30 AM patient resting comfortably. Results for orders placed or performed during the hospital encounter of 02/22/14  Comprehensive metabolic panel  Result Value Ref Range   Sodium 137 137 - 147 mEq/L   Potassium 4.0 3.7 - 5.3 mEq/L   Chloride 98 96 - 112 mEq/L  CO2 29 19 - 32 mEq/L   Glucose, Bld 133 (H) 70 - 99 mg/dL   BUN 6 6 - 23 mg/dL   Creatinine, Ser 0.65 0.50 - 1.10 mg/dL   Calcium 9.2 8.4 - 10.5 mg/dL   Total Protein 6.5 6.0 - 8.3 g/dL   Albumin 3.2 (L) 3.5 - 5.2 g/dL   AST 11 0 - 37 U/L   ALT 8 0 - 35 U/L   Alkaline Phosphatase 134 (H) 39 - 117 U/L   Total Bilirubin 0.4 0.3 - 1.2 mg/dL   GFR calc non Af Amer 89 (L) >90 mL/min   GFR calc Af Amer >90 >90 mL/min   Anion gap 10 5 - 15  CBC with Differential  Result Value Ref Range   WBC 8.4 4.0 - 10.5 K/uL   RBC 3.25 (L) 3.87 - 5.11 MIL/uL   Hemoglobin 6.5 (LL) 12.0 - 15.0 g/dL   HCT 23.4 (L) 36.0 - 46.0 %   MCV 72.0 (L) 78.0 - 100.0 fL   MCH 20.0 (L) 26.0 - 34.0 pg   MCHC 27.8 (L) 30.0 - 36.0 g/dL   RDW 19.8 (H) 11.5 - 15.5 %   Platelets 661 (H) 150 - 400 K/uL   Neutrophils Relative % 73 43 - 77 %   Lymphocytes Relative 13 12 - 46 %   Monocytes Relative 9 3 - 12 %   Eosinophils Relative 4 0 - 5 %   Basophils Relative 1 0 - 1 %   Neutro Abs 6.1 1.7 - 7.7 K/uL   Lymphs Abs 1.1 0.7 - 4.0 K/uL   Monocytes Absolute 0.8 0.1 - 1.0 K/uL   Eosinophils Absolute  0.3 0.0 - 0.7 K/uL   Basophils Absolute 0.1 0.0 - 0.1 K/uL   RBC Morphology TARGET CELLS   Troponin I  Result Value Ref Range   Troponin I <0.30 <0.30 ng/mL   No results found.  MDM  Patient felt to have symptomatic anemia. I spoke with Dr.Chiu PLan admit telemetry, transfuse with packed red cells GI consult Diagnoses #1 dyspnea #2 symptomatic anemia #3hyperglycemia Final diagnoses:  None      I personally performed the services described in this documentation, which was scribed in my presence. The recorded information has been reviewed and considered.    Orlie Dakin, MD 02/22/14 1042

## 2014-02-22 NOTE — ED Notes (Signed)
Patient with c/o right shoulder blade pain x 2 days, pain increases with deep breathing. Also c/o shortness of breath that is "around normal" per patient. +COPD

## 2014-02-22 NOTE — ED Notes (Signed)
CRITICAL VALUE ALERT  Critical value received: hemoglobin 6.5  Date of notification:  02/22/14  Time of notification:  0930  Critical value read back:Yes.    Nurse who received alert:  Allegra Lai, RN  MD notified (1st page):  Dr Winfred Leeds  Time of first page:  [phone]

## 2014-02-22 NOTE — H&P (Signed)
[facility] History and Physical  RENEE ERB KKX:[id number] DOB: 03/02/1946 DOA: 02/22/2014  Referring physician: Emergency Department PCP: Alonza Bogus, MD  Specialists:   Chief Complaint: SOB  HPI: Leah Dalton is a 68 y.o. female  With a hx of copd, polycythemia, previous tobacco abuse, osteoporosis who presents to the ED with progressive sob over the past week. Pt was initially concerned about copd exacerbation. In the ED, pt was noted to have hgb of 6.5. Two units prbc's were ordered through the ED and the hospitalist service consulted for admission  Review of Systems:  Per above, the remainder of the 10pt ros reviewed and are neg  Past Medical History  Diagnosis Date  . COPD (chronic obstructive pulmonary disease)     O2 dependent, started 05/31/2012  . Polycythemia     Has had to have phlebotomies in the pas  . History of tobacco abuse   . Osteoporosis    Past Surgical History  Procedure Laterality Date  . Abdominal hysterectomy    . Cataract extraction     Social History:  reports that she has quit smoking. She does not have any smokeless tobacco history on file. She reports that she drinks alcohol. She reports that she does not use illicit drugs.  where does patient live--home, ALF, SNF? and with whom if at home?  Can patient participate in ADLs?  Allergies  Allergen Reactions  . Codeine Itching  . Daliresp [Roflumilast] Diarrhea  . Keflex [Cephalexin] Other (See Comments)    Gives pt yeast infection  . Penicillins Itching    No family history on file. reviewed and is noncontributory to this case (be sure to complete)  Prior to Admission medications   Medication Sig Start Date End Date Taking? Authorizing Provider  albuterol (PROVENTIL HFA;VENTOLIN HFA) 108 (90 BASE) MCG/ACT inhaler Inhale 2 puffs into the lungs every 6 (six) hours as needed for wheezing.   Yes Historical Provider, MD  albuterol (PROVENTIL) (2.5 MG/3ML) 0.083% nebulizer  solution Take 2.5 mg by nebulization 3 (three) times daily.   Yes Historical Provider, MD  aspirin EC 81 MG tablet Take 81 mg by mouth daily.   Yes Historical Provider, MD  calcium carbonate (OS-CAL) 600 MG TABS Take 600 mg by mouth 2 (two) times daily with a meal.   Yes Historical Provider, MD  Cholecalciferol (VITAMIN D-3) 5000 UNITS TABS Take 1 tablet by mouth daily.   Yes Historical Provider, MD  famotidine (PEPCID AC) 10 MG chewable tablet Chew 10 mg by mouth 2 (two) times daily.   Yes Historical Provider, MD  Fluticasone-Salmeterol (ADVAIR) 250-50 MCG/DOSE AEPB Inhale 1 puff into the lungs every 12 (twelve) hours.   Yes Historical Provider, MD  furosemide (LASIX) 40 MG tablet Take 40 mg by mouth daily.   Yes Historical Provider, MD  ipratropium (ATROVENT) 0.02 % nebulizer solution Take 500 mcg by nebulization 3 (three) times daily.   Yes Historical Provider, MD  ALPRAZolam Duanne Moron) 0.5 MG tablet Take 1 tablet (0.5 mg total) by mouth 3 (three) times daily as needed for anxiety or sleep. Patient not taking: Reported on 02/22/2014 06/10/12   Doree Albee, MD  benzonatate (TESSALON) 100 MG capsule Take 1 capsule (100 mg total) by mouth 3 (three) times daily. Patient not taking: Reported on 02/22/2014 06/10/12   Doree Albee, MD  dextromethorphan-guaiFENesin (MUCINEX DM) 30-600 MG per 12 hr tablet Take 1 tablet by mouth every 12 (twelve) hours.    Historical Provider, MD  fluticasone (  FLONASE) 50 MCG/ACT nasal spray Place 2 sprays into the nose daily. Patient not taking: Reported on 02/22/2014 06/10/12   Doree Albee, MD  levofloxacin (LEVAQUIN) 500 MG tablet Take 1 tablet (500 mg total) by mouth daily. Patient not taking: Reported on 02/22/2014 06/10/12   Doree Albee, MD  predniSONE (DELTASONE) 20 MG tablet Take 2 tablets daily for 3 days, then 1 tablet daily for 3 days, then half tablet daily for 3 days, then STOP. Patient not taking: Reported on 02/22/2014 06/10/12   Doree Albee, MD   Physical Exam: Filed Vitals:   02/22/14 0849 02/22/14 0854  BP:  165/86  Pulse: 97   Temp: 97.7 F (36.5 C)   TempSrc: Oral   Resp: 25   Height: 5\' 4"  (1.626 m)   Weight: 71.215 kg (157 lb)   SpO2: 96%      General:  Awake, in nad  Eyes: Palor, PERRL B  ENT: membranes moist, dentition fair  Neck: trachea midline, neck supple  Cardiovascular: regular, s1, s2  Respiratory: normal resp effort, end-expiratory wheezing B  Abdomen: soft,nondistended, nontender  Skin: normal skin turgor, appears mildly pale  Musculoskeletal: perfused, no clubbing  Psychiatric: mood/affect normal// no auditory/visual hallucinations  Neurologic: cn2-12 grossly intact, strength/sensation intact  Labs on Admission:  Basic Metabolic Panel:  Recent Labs Lab 02/22/14 0908  NA 137  K 4.0  CL 98  CO2 29  GLUCOSE 133*  BUN 6  CREATININE 0.65  CALCIUM 9.2   Liver Function Tests:  Recent Labs Lab 02/22/14 0908  AST 11  ALT 8  ALKPHOS 134*  BILITOT 0.4  PROT 6.5  ALBUMIN 3.2*   No results for input(s): LIPASE, AMYLASE in the last 168 hours. No results for input(s): AMMONIA in the last 168 hours. CBC:  Recent Labs Lab 02/22/14 0908  WBC 8.4  NEUTROABS 6.1  HGB 6.5*  HCT 23.4*  MCV 72.0*  PLT 661*   Cardiac Enzymes:  Recent Labs Lab 02/22/14 0908  TROPONINI <0.30    BNP (last 3 results) No results for input(s): PROBNP in the last 8760 hours. CBG: No results for input(s): GLUCAP in the last 168 hours.  Radiological Exams on Admission: No results found.  Assessment/Plan Principal Problem:   Acute blood loss anemia Active Problems:   Acute-on-chronic respiratory failure   Polycythemia   1. Acute blood loss anemia 1. Pt noted to be heme pos in ED 2. 2 units prbc's ordered 3. Follow serial h/h and cont to transfuse as needed 4. Will consult GI for concerns of GI source of bleed 2. Acute on chronic resp failure 1. Suspect largely  secondary to profound anemia 2. On min o2 support 3. Cont on nebs PRN per below 3. COPD 1. Will cont on PRN nebs 2. On baseline 2L [place] at home 4. Polycythemia 1. Anemic on admit 5. DVTprophylaxis 1. SCD's  Code Status: Full Family Communication: Pt in room  Disposition Plan: pending   Time spent: 72min  Lasondra Hodgkins, [facility] Pager [phone]  If 7PM-7AM, please contact night-coverage [email or web address] Password [facility] 02/22/2014, 10:42 AM

## 2014-02-23 ENCOUNTER — Encounter (HOSPITAL_COMMUNITY)
Admission: EM | Disposition: A | Discharge status: Home / Self Care | Payer: Self-pay | Source: Home / Self Care | Attending: Pulmonary Disease

## 2014-02-23 ENCOUNTER — Encounter (HOSPITAL_COMMUNITY): Payer: Self-pay

## 2014-02-23 DIAGNOSIS — Q2733 Arteriovenous malformation of digestive system vessel: Secondary | ICD-10-CM

## 2014-02-23 DIAGNOSIS — K2100 Gastro-esophageal reflux disease with esophagitis, without bleeding: Secondary | ICD-10-CM | POA: Diagnosis present

## 2014-02-23 HISTORY — PX: ESOPHAGOGASTRODUODENOSCOPY: SHX5428

## 2014-02-23 HISTORY — PX: COLONOSCOPY: SHX5424

## 2014-02-23 LAB — TYPE AND SCREEN
ABO/RH(D): A POS
ANTIBODY SCREEN: NEGATIVE
UNIT DIVISION: 0
Unit division: 0

## 2014-02-23 LAB — CBC
HCT: 31.5 % — ABNORMAL LOW (ref 36.0–46.0)
HEMOGLOBIN: 9.5 g/dL — AB (ref 12.0–15.0)
MCH: 22.5 pg — AB (ref 26.0–34.0)
MCHC: 30.2 g/dL (ref 30.0–36.0)
MCV: 74.6 fL — ABNORMAL LOW (ref 78.0–100.0)
Platelets: 557 10*3/uL — ABNORMAL HIGH (ref 150–400)
RBC: 4.22 MIL/uL (ref 3.87–5.11)
RDW: 18.8 % — ABNORMAL HIGH (ref 11.5–15.5)
WBC: 8.2 10*3/uL (ref 4.0–10.5)

## 2014-02-23 LAB — COMPREHENSIVE METABOLIC PANEL
ALK PHOS: 128 U/L — AB (ref 39–117)
ALT: 9 U/L (ref 0–35)
AST: 16 U/L (ref 0–37)
Albumin: 3.2 g/dL — ABNORMAL LOW (ref 3.5–5.2)
Anion gap: 13 (ref 5–15)
BILIRUBIN TOTAL: 0.7 mg/dL (ref 0.3–1.2)
BUN: 4 mg/dL — ABNORMAL LOW (ref 6–23)
CO2: 27 meq/L (ref 19–32)
Calcium: 8.3 mg/dL — ABNORMAL LOW (ref 8.4–10.5)
Chloride: 99 mEq/L (ref 96–112)
Creatinine, Ser: 0.6 mg/dL (ref 0.50–1.10)
GFR calc non Af Amer: >90 mL/min (ref >90)
GLUCOSE: 98 mg/dL (ref 70–99)
POTASSIUM: 3.5 meq/L — AB (ref 3.7–5.3)
Sodium: 139 mEq/L (ref 137–147)
TOTAL PROTEIN: 6.4 g/dL (ref 6.0–8.3)

## 2014-02-23 SURGERY — EGD (ESOPHAGOGASTRODUODENOSCOPY)
Anesthesia: Moderate Sedation

## 2014-02-23 MED ORDER — MEPERIDINE HCL 100 MG/ML IJ SOLN
INTRAMUSCULAR | Status: AC
  Filled 2014-02-23: qty 2

## 2014-02-23 MED ORDER — MIDAZOLAM HCL 5 MG/5ML IJ SOLN
INTRAMUSCULAR | Status: DC | PRN
  Administered 2014-02-23: 2 mg via INTRAVENOUS
  Administered 2014-02-23: 1 mg via INTRAVENOUS

## 2014-02-23 MED ORDER — MIDAZOLAM HCL 5 MG/5ML IJ SOLN
INTRAMUSCULAR | Status: AC
  Filled 2014-02-23: qty 10

## 2014-02-23 MED ORDER — MEPERIDINE HCL 100 MG/ML IJ SOLN
INTRAMUSCULAR | Status: DC | PRN
  Administered 2014-02-23: 25 mg via INTRAVENOUS
  Administered 2014-02-23: 50 mg via INTRAVENOUS

## 2014-02-23 MED ORDER — ONDANSETRON HCL 4 MG/2ML IJ SOLN
INTRAMUSCULAR | Status: AC
  Filled 2014-02-23: qty 2

## 2014-02-23 MED ORDER — LIDOCAINE VISCOUS 2 % MT SOLN
OROMUCOSAL | Status: AC
  Filled 2014-02-23: qty 15

## 2014-02-23 MED ORDER — ONDANSETRON HCL 4 MG/2ML IJ SOLN
INTRAMUSCULAR | Status: DC | PRN
  Administered 2014-02-23: 4 mg via INTRAVENOUS

## 2014-02-23 MED ORDER — SODIUM CHLORIDE 0.9 % IV SOLN
INTRAVENOUS | Status: DC
  Administered 2014-02-23: 15:00:00 via INTRAVENOUS

## 2014-02-23 MED ORDER — LIDOCAINE VISCOUS 2 % MT SOLN
OROMUCOSAL | Status: DC | PRN
  Administered 2014-02-23: 3 mL via OROMUCOSAL

## 2014-02-23 MED ORDER — SODIUM CHLORIDE 0.9 % IV SOLN: INTRAVENOUS | Status: DC

## 2014-02-23 MED ORDER — PANTOPRAZOLE SODIUM 40 MG PO TBEC
40.0000 mg | DELAYED_RELEASE_TABLET | Freq: Two times a day (BID) | ORAL | Status: DC
  Administered 2014-02-23 – 2014-02-24 (×2): 40 mg via ORAL
  Filled 2014-02-23 (×2): qty 1

## 2014-02-23 MED ORDER — SIMETHICONE 40 MG/0.6ML PO SUSP
ORAL | Status: DC | PRN
  Administered 2014-02-23: 16:00:00

## 2014-02-23 NOTE — Op Note (Signed)
[facility] [address] [place], [address]   ENDOSCOPY PROCEDURE REPORT  PATIENT: Leah Dalton, Leah Dalton  MR#: [id number] BIRTHDATE: 07/20/1945 , 73  yrs. old GENDER: female ENDOSCOPIST: R.  Garfield Cornea, MD FACP FACG REFERRED BY:  Sinda Du, M.D. PROCEDURE DATE:  03-23-14 PROCEDURE:  EGD, diagnostic INDICATIONS:  Long-standing GERD; iron deficiency anemia. MEDICATIONS: Versed 2 mg IV and Demerol 50 mg IV.  Zofran 4 mg IV. Xylocaine gel orally. ASA CLASS:      Class III  CONSENT: The risks, benefits, limitations, alternatives and imponderables have been discussed.  The potential for biopsy, esophogeal dilation, etc. have also been reviewed.  Questions have been answered.  All parties agreeable.  Please see the history and physical in the medical record for more information.  DESCRIPTION OF PROCEDURE: After the risks benefits and alternatives of the procedure were thoroughly explained, informed consent was obtained.  The [id number] ([id number]) endoscope was introduced through the mouth and advanced to the second portion of the duodenum , limited by Without limitations. The instrument was slowly withdrawn as the mucosa was fully examined.    Circumferential distal esophageal erosion with focal area  of 7 mm ulceration straddling the GE junction.  Linear erosions coming up. 7 cm into  the distal esophagus.  Esophagus remain patent.  No Barrett's esophagus. Stomach empty.  Small hiatal hernia.  Normal gastric mucosa.  Patent pylorus.  Normal first and second portion of the duodenum. Retroflexed views revealed as previously described.     The scope was then withdrawn from the patient and the procedure completed.  COMPLICATIONS: There were no immediate complications.  ENDOSCOPIC IMPRESSION: Ulcerative/erosive reflux esophagitis. Small hiatal hernia.  RECOMMENDATIONS: Daily twice a day PPI therapy. See colonoscopy report.  REPEAT EXAM:  eSigned:  R.  Garfield Cornea, MD Rosalita Chessman [facility] 03-23-14 3:54 PM    CC:  CPT CODES: ICD CODES:  The ICD and CPT codes recommended by this software are interpretations from the data that the clinical staff has captured with the software.  The verification of the translation of this report to the ICD and CPT codes and modifiers is the sole responsibility of the health care institution and practicing physician where this report was generated.  [employer]. will not be held responsible for the validity of the ICD and CPT codes included on this report.  AMA assumes no liability for data contained or not contained herein. CPT is a Designer, television/film set of the [employer].  PATIENT NAME:  Maresa, Morash MR#: [id number]

## 2014-02-23 NOTE — Op Note (Signed)
[facility] [address] [place], [address]   COLONOSCOPY PROCEDURE REPORT  PATIENT: Leah Dalton, Leah Dalton  MR#: [id number] BIRTHDATE: [birth year] , 68  yrs. old GENDER: female ENDOSCOPIST: R.  Garfield Cornea, MD FACP [facility] REFERRED BS:[id number] Luan Pulling, M.D. PROCEDURE DATE:  2014-03-23 PROCEDURE:   Colonoscopy with ablation INDICATIONS:Hemoccult-positive stool; microcytic anemia. MEDICATIONS: Versed 3 mg IV 75 mg IV in divided doses.  Zofran 4 mg IV ASA CLASS:       Class III  CONSENT: The risks, benefits, alternatives and imponderables including but not limited to bleeding, perforation as well as the possibility of a missed lesion have been reviewed.  The potential for biopsy, lesion removal, etc. have also been discussed. Questions have been answered.  All parties agreeable.  Please see the history and physical in the medical record for more information.  DESCRIPTION OF PROCEDURE:   After the risks benefits and alternatives of the procedure were thoroughly explained, informed consent was obtained.  The digital rectal exam revealed no rectal mass.   The [id number] ([id number])  endoscope was introduced through the anus and advanced to the cecum, which was identified by transillumination from the light source. No adverse events experienced.   The quality of the prep was adequate.  The instrument was then slowly withdrawn as the colon was fully examined.      COLON FINDINGS: Engorged internal hemorrhoids; (1) 5 mm AVM in the rectum at 10 cm from the anal verge; otherwise, remainder of the rectal mucosa appeared normal.  Extensive left-sided diverticula; the patient had (1) 6 mm AVM on the proximal side of the ileocecal valve; The remainder of the colonic mucosa appeared normal.  I did not see any polyps or any evidence of neoplasm.  I intubated the terminal ileum to 5 cm and found normal mucosa at this level.   The cecal and rectal AVMs were ablated with the  APC/erbie unit with multiple applications utilizing the circular probe at 20 J each. Retroflexion was performed. .  Withdrawal time=17 minutes 0 seconds.  The scope was withdrawn and the procedure completed. COMPLICATIONS: There were no immediate complications.  ENDOSCOPIC IMPRESSION: Rectal and colonic AVMs?"status post ablation as described above. Engorged internal hemorrhoids; colonic diverticulosis.  RECOMMENDATIONS: Advance diet; if evidence of recurrent anemia or bleeding, she may need a capsule study of her small intestine. I would entertain the possibility that chronic daily epistaxis may have something to do with her anemia and Hemoccult-positive stool(might consider an ENT evaluation). See EGD report.   Discussed all findings with son.  eSigned:  R. Garfield Cornea, MD Rosalita Chessman [facility] 03/23/14 4:32 PM   cc:  CPT CODES: ICD CODES:  The ICD and CPT codes recommended by this software are interpretations from the data that the clinical staff has captured with the software.  The verification of the translation of this report to the ICD and CPT codes and modifiers is the sole responsibility of the health care institution and practicing physician where this report was generated.  [facility]. will not be held responsible for the validity of the ICD and CPT codes included on this report.  AMA assumes no liability for data contained or not contained herein. CPT is a Designer, television/film set of the [employer].  PATIENT NAME:  Leah Dalton, Leah Dalton MR#: [id number]

## 2014-02-23 NOTE — H&P (View-Only) (Signed)
Referring Provider: No ref. provider found Primary Care Physician:  Leah Bogus, MD Primary Gastroenterologist:  Dr. Gala Dalton  Reason for Consultation:  Anemia; heme positive stool  HPI: 68 year old lady with O2 dependent COPD admitted through the emergency department this morning with progressive weakness and dyspnea. She was found to be significantly anemic with a hemoglobin of 6.5 and Hemoccult positive. She is hemodynamically stable.  Patient denies melena, hematochezia or hematemesis. Has long-standing GERD almost every day for years for which he takes Pepcid complete. No dysphagia or odynophagia. Denies abdominal pain and early satiety or change in weight.  No prior gastrointestinal diseases (although I note that she was found to have diverticulosis with small abscess on 2002 CT scan. It is notable that her liver appeared normal at that time). She's been offered colonoscopy multiple times in the past but has declined. No prior EGD. Positive family history of precancerous polyps in her father which required surveillance colonoscopies at short interval.  Is is mentioned in the record that she has a diagnosis of polycythemia. She describes undergoing phlebotomies under the direction of Dr. Tressie Dalton up until about 5 years ago when she moved to [place]. 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  Her last ferritin in the chart was 1 year ago at 57; in 2010 was elevated at 436. Her last hemoglobin in the medical record was 14.9 back in March 2014.    Past Medical History  Diagnosis Date  . COPD (chronic obstructive pulmonary disease)     O2 dependent, started 05/31/2012  . Polycythemia     Has had to have phlebotomies in the pas  . History of tobacco abuse   . Osteoporosis     Past Surgical History  Procedure Laterality Date  . Abdominal hysterectomy    . Cataract extraction      Prior to Admission medications     Medication Sig Start Date End Date Taking? Authorizing Provider  albuterol (PROVENTIL HFA;VENTOLIN HFA) 108 (90 BASE) MCG/ACT inhaler Inhale 2 puffs into the lungs every 6 (six) hours as needed for wheezing.   Yes Historical Provider, MD  albuterol (PROVENTIL) (2.5 MG/3ML) 0.083% nebulizer solution Take 2.5 mg by nebulization 3 (three) times daily.   Yes Historical Provider, MD  aspirin EC 81 MG tablet Take 81 mg by mouth daily.   Yes Historical Provider, MD  calcium carbonate (OS-CAL) 600 MG TABS Take 600 mg by mouth 2 (two) times daily with a meal.   Yes Historical Provider, MD  Cholecalciferol (VITAMIN D-3) 5000 UNITS TABS Take 1 tablet by mouth daily.   Yes Historical Provider, MD  famotidine (PEPCID AC) 10 MG chewable tablet Chew 10 mg by mouth 2 (two) times daily.   Yes Historical Provider, MD  Fluticasone-Salmeterol (ADVAIR) 250-50 MCG/DOSE AEPB Inhale 1 puff into the lungs every 12 (twelve) hours.   Yes Historical Provider, MD  furosemide (LASIX) 40 MG tablet Take 40 mg by mouth daily.   Yes Historical Provider, MD  ipratropium (ATROVENT) 0.02 % nebulizer solution Take 500 mcg by nebulization 3 (three) times daily.   Yes Historical Provider, MD  ALPRAZolam Duanne Moron) 0.5 MG tablet Take 1 tablet (0.5 mg total) by mouth 3 (three) times daily as needed for anxiety or sleep. Patient not taking: Reported on 02/22/2014 06/10/12   Doree Albee, MD  benzonatate (TESSALON) 100 MG capsule Take 1 capsule (100 mg total) by mouth 3 (three) times daily. Patient not  taking: Reported on 02/22/2014 06/10/12   Doree Albee, MD  dextromethorphan-guaiFENesin (MUCINEX DM) 30-600 MG per 12 hr tablet Take 1 tablet by mouth every 12 (twelve) hours.    Historical Provider, MD  fluticasone (FLONASE) 50 MCG/ACT nasal spray Place 2 sprays into the nose daily. Patient not taking: Reported on 02/22/2014 06/10/12   Doree Albee, MD  levofloxacin (LEVAQUIN) 500 MG tablet Take 1 tablet (500 mg total) by mouth  daily. Patient not taking: Reported on 02/22/2014 06/10/12   Doree Albee, MD  predniSONE (DELTASONE) 20 MG tablet Take 2 tablets daily for 3 days, then 1 tablet daily for 3 days, then half tablet daily for 3 days, then STOP. Patient not taking: Reported on 02/22/2014 06/10/12   Doree Albee, MD    Current Facility-Administered Medications  Medication Dose Route Frequency Provider Last Rate Last Dose  . 0.9 %  sodium chloride infusion   Intravenous Continuous Donne Hazel, MD      . furosemide (LASIX) tablet 40 mg  40 mg Oral Daily Donne Hazel, MD      . ipratropium-albuterol (DUONEB) 0.5-2.5 (3) MG/3ML nebulizer solution 3 mL  3 mL Nebulization TID Donne Hazel, MD      . mometasone-formoterol [facility]) 100-5 MCG/ACT inhaler 2 puff  2 puff Inhalation BID Donne Hazel, MD      . ondansetron [facility]) tablet 4 mg  4 mg Oral Q6H PRN Donne Hazel, MD       Or  . ondansetron [facility]) injection 4 mg  4 mg Intravenous Q6H PRN Donne Hazel, MD      . pantoprazole (PROTONIX) injection 40 mg  40 mg Intravenous Q24H Donne Hazel, MD      . sodium chloride 0.9 % injection 3 mL  3 mL Intravenous Q12H Donne Hazel, MD        Allergies as of 02/22/2014 - Review Complete 02/22/2014  Allergen Reaction Noted  . Codeine Itching 06/07/2012  . Daliresp [roflumilast] Diarrhea 06/07/2012  . Keflex [cephalexin] Other (See Comments) 06/07/2012  . Penicillins Itching 06/07/2012    History reviewed. No pertinent family history.  History   Social History  . Marital Status: Divorced    Spouse Name: N/A    Number of Children: N/A  . Years of Education: N/A   Occupational History  . Not on file.   Social History Main Topics  . Smoking status: Former Research scientist (life sciences)  . Smokeless tobacco: Not on file  . Alcohol Use: Yes     Comment: 3 beer nightly  . Drug Use: No  . Sexual Activity: Not on file   Other Topics Concern  . Not on file   Social History Narrative   social history: Drinks  2-3 Miller light beers every night. No illicit drugs.  Review of Systems: Gen: Denies any fever, chills, sweats, anorexia, , weight loss, and sleep disorder CV: Denies chest pain, angina, palpitations, syncope, orthopnea, PND, peripheral edema, and claudication. Resp: , coughing up blood, and pleurisy. GI: Denies vomiting blood, jaundice, and fecal incontinence.   Denies dysphagia or odynophagia. Derm: Denies rash, itching, dry skin, hives, moles, warts, or unhealing ulcers.  Psych: Denies depression, anxiety, memory loss, suicidal ideation, hallucinations, paranoia, and confusion. Heme: Denies bruising, bleeding, and enlarged lymph nodes.   Physical Exam: Vital signs in last 24 hours: Temp:  [97.7 F (36.5 C)-98.4 F (36.9 C)] 98.4 F (36.9 C) (11/29 1125) Pulse Rate:  [83-97] 89 (11/29 1125)  Resp:  [12-25] 16 (11/29 1100) BP: (122-181)/(60-86) 181/70 mmHg (11/29 1125) SpO2:  [94 %-100 %] 94 % (11/29 1125) Weight:  [157 lb (71.215 kg)-157 lb 8 oz (71.442 kg)] 157 lb 8 oz (71.442 kg) (11/29 1125)   General:   Alert, somewhat chronically ill-appearing lady who appears in no acute distress whatsoever. She is accompanied by her son, , Ariely Riddell  Normocephalic and atraumatic. Eyes:  Sclera clear, no icterus.   Conjunctiva pink. Ears:  Normal auditory acuity. Nose:  No deformity, discharge,  or lesions. Mouth:  No deformity or lesions, dentition normal. Neck:  Supple; no masses or thyromegaly. Lungs:  Clear  with distant breath sounds throughout both lung fields.   Heart:  Regular rate and rhythm; no murmurs, clicks, rubs,  or gallops. Abdomen: Nondistended. Positive bowel sounds.  Abdomen is soft and nontender without appreciable mass or organomegaly, without guarding, and without rebound.   Rectal:  Deferred until time of colonoscopy.   Msk:  Symmetrical without gross deformities. Normal posture. Pulses:  Normal pulses noted. Extremities:  Without clubbing or  edema. Neurologic:  Alert and  oriented x4;  grossly normal neurologically. Skin:  Intact without significant lesions or rashes. Cervical Nodes:  No significant cervical adenopathy. Psych:  Alert and cooperative. Normal mood and affect.  Intake/Output from previous day:   Intake/Output this shift: Total I/O In: 250 [I.V.:250] Out: -   Lab Results:  Recent Labs  02/22/14 0908  WBC 8.4  HGB 6.5*  HCT 23.4*  PLT 661*   BMET  Recent Labs  02/22/14 0908  NA 137  K 4.0  CL 98  CO2 29  GLUCOSE 133*  BUN 6  CREATININE 0.65  CALCIUM 9.2   LFT  Recent Labs  02/22/14 0908  PROT 6.5  ALBUMIN 3.2*  AST 11  ALT 8  ALKPHOS 134*  BILITOT 0.4   Studies/Results: Dg Chest 2 View  02/22/2014   CLINICAL DATA:  Shortness of breath, right scapular pain  EXAM: CHEST  2 VIEW  COMPARISON:  06/07/2012  FINDINGS: Chronic interstitial markings/emphysematous changes. No focal consolidation. No pleural effusion or pneumothorax.  The heart is normal in size. Mild prominence the right perihilar region, likely vascular.  Visualized osseous structures are within normal limits.  IMPRESSION: No evidence of acute cardiopulmonary disease.   Electronically Signed   By: Julian Hy M.D.   On: 02/22/2014 10:53   Impression:  Pleasant 68 year old lady with COPD admitted hospital progressive dyspnea weakness and a hemoglobin of 6.4 and microcytic indices. She is Hemoccult positive. Positive family history of precancerous polyps in a first-degree relative. Long-standing, daily GERD no prior upper GI tract evaluation or colonoscopy. Moreover, patient is homozygous for the major hemachromatosis mutation. Ferritin previously elevated; previously underwent phlebotomies. More recently, ferritin was normal with process in evolution likely producing iron deficiency anemia. She could easily have an occult colon cancer or other significant pathology anywhere along her GI tract producing the  Current clinical  picture. She may well have hemachromatosis. The process leading to her anemia and current presentation may actually be protecting her as far as iron overload is concerned. Will look into the issue of hemachromatosis further once she is over this acute illness.  Recommendations: I have offered this lady both a diagnostic EGD and colonoscopy tomorrow.The risks, benefits, limitations, imponderables and alternatives regarding both EGD and colonoscopy have been reviewed with the patient. Questions have been answered. All parties agreeable.  Fully agree with 2 unit transfusion of packed  red blood cells and PPI therapy empirically.   Further recommendations to follow.   Notice:  This dictation was prepared with Dragon dictation along with smaller phrase technology. Any transcriptional errors that result from this process are unintentional and may not be corrected upon review.

## 2014-02-23 NOTE — Plan of Care (Signed)
Problem: Phase I Progression Outcomes Goal: Pain controlled Outcome: Completed/Met Date Met:  02/23/14

## 2014-02-23 NOTE — Interval H&P Note (Signed)
History and Physical Interval Note:  02/23/2014 3:35 PM  Leah Dalton  has presented today for surgery, with the diagnosis of GERD and GI bleed  The various methods of treatment have been discussed with the patient and family. After consideration of risks, benefits and other options for treatment, the patient has consented to  Procedure(s) with comments: ESOPHAGOGASTRODUODENOSCOPY (EGD) (N/A) COLONOSCOPY (N/A) - Need pediatric colonoscope for procedure as a surgical intervention .  The patient's history has been reviewed, patient examined, no change in status, stable for surgery.  I have reviewed the patient's chart and labs.  Questions were answered to the patient's satisfaction.     Robert Rourk  No change. Colonoscopy/EGD per plan.The risks, benefits, limitations, imponderables and alternatives regarding both EGD and colonoscopy have been reviewed with the patient. Questions have been answered. All parties agreeable.

## 2014-02-23 NOTE — Progress Notes (Signed)
Subjective: She was admitted with symptomatic anemia. In the past she has had polycythemia that had to be treated with removal of blood. She now came in with hemoglobin level around 6-1/2. She has not noticed any bleeding. She has pretty severe COPD and was more short of breath and she thought she was having a COPD exacerbation.  Objective: Vital signs in last 24 hours: Temp:  [97.9 F (36.6 C)-98.5 F (36.9 C)] 98 F (36.7 C) (11/30 6606) Pulse Rate:  [83-92] 86 (11/30 0611) Resp:  [12-24] 21 (11/30 0611) BP: (122-182)/(60-86) 163/67 mmHg (11/30 0611) SpO2:  [93 %-100 %] 98 % (11/30 0714) FiO2 (%):  [28 %] 28 % (11/29 1926) Weight:  [71.442 kg (157 lb 8 oz)] 71.442 kg (157 lb 8 oz) (11/29 1125) Weight change:  Last BM Date: 02/23/14  Intake/Output from previous day: 11/29 0701 - 11/30 0700 In: 852 [P.O.:240; I.V.:250] Out: 200 [Urine:200]  PHYSICAL EXAM General appearance: alert, cooperative and no distress Resp: rhonchi bilaterally Cardio: regular rate and rhythm, S1, S2 normal, no murmur, click, rub or gallop GI: soft, non-tender; bowel sounds normal; no masses,  no organomegaly Extremities: extremities normal, atraumatic, no cyanosis or edema  Lab Results:  Results for orders placed or performed during the hospital encounter of 02/22/14 (from the past 48 hour(s))  Comprehensive metabolic panel     Status: Abnormal   Collection Time: 02/22/14  9:08 AM  Result Value Ref Range   Sodium 137 137 - 147 mEq/L   Potassium 4.0 3.7 - 5.3 mEq/L   Chloride 98 96 - 112 mEq/L   CO2 29 19 - 32 mEq/L   Glucose, Bld 133 (H) 70 - 99 mg/dL   BUN 6 6 - 23 mg/dL   Creatinine, Ser 0.65 0.50 - 1.10 mg/dL   Calcium 9.2 8.4 - 10.5 mg/dL   Total Protein 6.5 6.0 - 8.3 g/dL   Albumin 3.2 (L) 3.5 - 5.2 g/dL   AST 11 0 - 37 U/L   ALT 8 0 - 35 U/L   Alkaline Phosphatase 134 (H) 39 - 117 U/L   Total Bilirubin 0.4 0.3 - 1.2 mg/dL   GFR calc non Af Amer 89 (L) >90 mL/min   GFR calc Af Amer >90  >90 mL/min    Comment: (NOTE) The eGFR has been calculated using the CKD EPI equation. This calculation has not been validated in all clinical situations. eGFR's persistently <90 mL/min signify possible Chronic Kidney Disease.    Anion gap 10 5 - 15  CBC with Differential     Status: Abnormal   Collection Time: 02/22/14  9:08 AM  Result Value Ref Range   WBC 8.4 4.0 - 10.5 K/uL   RBC 3.25 (L) 3.87 - 5.11 MIL/uL   Hemoglobin 6.5 (LL) 12.0 - 15.0 g/dL    Comment: CRITICAL RESULT CALLED TO, READ BACK BY AND VERIFIED WITH: ANDERSON D. AT 0930A ON [id number] BY THOMPSON S.    HCT 23.4 (L) 36.0 - 46.0 %   MCV 72.0 (L) 78.0 - 100.0 fL   MCH 20.0 (L) 26.0 - 34.0 pg   MCHC 27.8 (L) 30.0 - 36.0 g/dL   RDW 19.8 (H) 11.5 - 15.5 %   Platelets 661 (H) 150 - 400 K/uL   Neutrophils Relative % 73 43 - 77 %   Lymphocytes Relative 13 12 - 46 %   Monocytes Relative 9 3 - 12 %   Eosinophils Relative 4 0 - 5 %   Basophils Relative  1 0 - 1 %   Neutro Abs 6.1 1.7 - 7.7 K/uL   Lymphs Abs 1.1 0.7 - 4.0 K/uL   Monocytes Absolute 0.8 0.1 - 1.0 K/uL   Eosinophils Absolute 0.3 0.0 - 0.7 K/uL   Basophils Absolute 0.1 0.0 - 0.1 K/uL   RBC Morphology TARGET CELLS     Comment: TEARDROP CELLS POLYCHROMASIA PRESENT HYPOCHROMIA   Troponin I     Status: None   Collection Time: 02/22/14  9:08 AM  Result Value Ref Range   Troponin I <0.30 <0.30 ng/mL    Comment:        Due to the release kinetics of cTnI, a negative result within the first hours of the onset of symptoms does not rule out myocardial infarction with certainty. If myocardial infarction is still suspected, repeat the test at appropriate intervals.   Occult blood, poc device     Status: Abnormal   Collection Time: 02/22/14  9:37 AM  Result Value Ref Range   Fecal Occult Bld POSITIVE (A) NEGATIVE  Type and screen     Status: None   Collection Time: 02/22/14 10:00 AM  Result Value Ref Range   ABO/RH(D) A POS    Antibody Screen NEG    Sample  Expiration 02/25/2014    Unit Number [id number]    Blood Component Type RED CELLS,LR    Unit division 00    Status of Unit ISSUED,FINAL    Transfusion Status OK TO TRANSFUSE    Crossmatch Result Compatible    Unit Number [id number]    Blood Component Type RED CELLS,LR    Unit division 00    Status of Unit ISSUED,FINAL    Transfusion Status OK TO TRANSFUSE    Crossmatch Result Compatible   Prepare RBC     Status: None   Collection Time: 02/22/14 10:00 AM  Result Value Ref Range   Order Confirmation ORDER PROCESSED BY BLOOD BANK   ABO/Rh     Status: None   Collection Time: 02/22/14 10:00 AM  Result Value Ref Range   ABO/RH(D) A POS   Hemoglobin     Status: Abnormal   Collection Time: 02/22/14  5:54 PM  Result Value Ref Range   Hemoglobin 8.5 (L) 12.0 - 15.0 g/dL    Comment: POST TRANSFUSION SPECIMEN  Hemoglobin     Status: Abnormal   Collection Time: 02/22/14 11:31 PM  Result Value Ref Range   Hemoglobin 9.2 (L) 12.0 - 15.0 g/dL  Comprehensive metabolic panel     Status: Abnormal   Collection Time: 02/23/14  6:13 AM  Result Value Ref Range   Sodium 139 137 - 147 mEq/L   Potassium 3.5 (L) 3.7 - 5.3 mEq/L   Chloride 99 96 - 112 mEq/L   CO2 27 19 - 32 mEq/L   Glucose, Bld 98 70 - 99 mg/dL   BUN 4 (L) 6 - 23 mg/dL   Creatinine, Ser 0.60 0.50 - 1.10 mg/dL   Calcium 8.3 (L) 8.4 - 10.5 mg/dL   Total Protein 6.4 6.0 - 8.3 g/dL   Albumin 3.2 (L) 3.5 - 5.2 g/dL   AST 16 0 - 37 U/L   ALT 9 0 - 35 U/L   Alkaline Phosphatase 128 (H) 39 - 117 U/L   Total Bilirubin 0.7 0.3 - 1.2 mg/dL   GFR calc non Af Amer >90 >90 mL/min   GFR calc Af Amer >90 >90 mL/min    Comment: (NOTE) The eGFR has been calculated using  the CKD EPI equation. This calculation has not been validated in all clinical situations. eGFR's persistently <90 mL/min signify possible Chronic Kidney Disease.    Anion gap 13 5 - 15  CBC     Status: Abnormal   Collection Time: 02/23/14  6:13 AM  Result Value Ref  Range   WBC 8.2 4.0 - 10.5 K/uL   RBC 4.22 3.87 - 5.11 MIL/uL   Hemoglobin 9.5 (L) 12.0 - 15.0 g/dL   HCT 31.5 (L) 36.0 - 46.0 %   MCV 74.6 (L) 78.0 - 100.0 fL   MCH 22.5 (L) 26.0 - 34.0 pg   MCHC 30.2 30.0 - 36.0 g/dL   RDW 18.8 (H) 11.5 - 15.5 %   Platelets 557 (H) 150 - 400 K/uL    ABGS No results for input(s): PHART, PO2ART, TCO2, HCO3 in the last 72 hours.  Invalid input(s): PCO2 CULTURES No results found for this or any previous visit (from the past 240 hour(s)). Studies/Results: Dg Chest 2 View  02/22/2014   CLINICAL DATA:  Shortness of breath, right scapular pain  EXAM: CHEST  2 VIEW  COMPARISON:  06/07/2012  FINDINGS: Chronic interstitial markings/emphysematous changes. No focal consolidation. No pleural effusion or pneumothorax.  The heart is normal in size. Mild prominence the right perihilar region, likely vascular.  Visualized osseous structures are within normal limits.  IMPRESSION: No evidence of acute cardiopulmonary disease.   Electronically Signed   By: Julian Hy M.D.   On: 02/22/2014 10:53    Medications:  Prior to Admission:  Prescriptions prior to admission  Medication Sig Dispense Refill Last Dose  . albuterol (PROVENTIL HFA;VENTOLIN HFA) 108 (90 BASE) MCG/ACT inhaler Inhale 2 puffs into the lungs every 6 (six) hours as needed for wheezing.   02/22/2014 at Unknown time  . albuterol (PROVENTIL) (2.5 MG/3ML) 0.083% nebulizer solution Take 2.5 mg by nebulization 3 (three) times daily.   02/22/2014 at Unknown time  . aspirin EC 81 MG tablet Take 81 mg by mouth daily.   02/21/2014 at Unknown time  . calcium carbonate (OS-CAL) 600 MG TABS Take 600 mg by mouth 2 (two) times daily with a meal.   02/21/2014 at Unknown time  . Cholecalciferol (VITAMIN D-3) 5000 UNITS TABS Take 1 tablet by mouth daily.   02/21/2014 at Unknown time  . famotidine (PEPCID AC) 10 MG chewable tablet Chew 10 mg by mouth 2 (two) times daily.   02/21/2014 at Unknown time  .  Fluticasone-Salmeterol (ADVAIR) 250-50 MCG/DOSE AEPB Inhale 1 puff into the lungs every 12 (twelve) hours.   02/22/2014 at Unknown time  . furosemide (LASIX) 40 MG tablet Take 40 mg by mouth daily.   02/21/2014 at Unknown time  . ipratropium (ATROVENT) 0.02 % nebulizer solution Take 500 mcg by nebulization 3 (three) times daily.   02/22/2014 at Unknown time  . ALPRAZolam (XANAX) 0.5 MG tablet Take 1 tablet (0.5 mg total) by mouth 3 (three) times daily as needed for anxiety or sleep. (Patient not taking: Reported on 02/22/2014) 30 tablet 0   . benzonatate (TESSALON) 100 MG capsule Take 1 capsule (100 mg total) by mouth 3 (three) times daily. (Patient not taking: Reported on 02/22/2014) 20 capsule 0   . dextromethorphan-guaiFENesin (MUCINEX DM) 30-600 MG per 12 hr tablet Take 1 tablet by mouth every 12 (twelve) hours.   unknown  . fluticasone (FLONASE) 50 MCG/ACT nasal spray Place 2 sprays into the nose daily. (Patient not taking: Reported on 02/22/2014) 1 g 0   .  levofloxacin (LEVAQUIN) 500 MG tablet Take 1 tablet (500 mg total) by mouth daily. (Patient not taking: Reported on 02/22/2014) 7 tablet 0   . predniSONE (DELTASONE) 20 MG tablet Take 2 tablets daily for 3 days, then 1 tablet daily for 3 days, then half tablet daily for 3 days, then STOP. (Patient not taking: Reported on 02/22/2014) 12 tablet 0    Scheduled: . sodium chloride   Intravenous STAT  . furosemide  40 mg Oral Daily  . ipratropium-albuterol  3 mL Nebulization TID  . mometasone-formoterol  2 puff Inhalation BID  . pantoprazole (PROTONIX) IV  40 mg Intravenous Q24H  . sodium chloride  3 mL Intravenous Q12H   Continuous: . sodium chloride    . sodium chloride     PNY:OXNRCOIODGS **OR** ondansetron (ZOFRAN) IV  Assesment: She was admitted with acute blood loss anemia. She has severe COPD. She has acute on chronic respiratory failure. GI consultation is noted and appreciated Principal Problem:   Acute blood loss anemia Active  Problems:   Acute-on-chronic respiratory failure   Polycythemia   Symptomatic anemia    Plan: Continue treatments. She's had 2 units of blood and her hemoglobin level is better. She will have EGD and colonoscopy later today    LOS: 1 day   Leah Dalton L 02/23/2014, 8:54 AM

## 2014-02-23 NOTE — Progress Notes (Signed)
UR chart review completed.  

## 2014-02-23 NOTE — Care Management Note (Addendum)
    Page 1 of 1   02/24/2014     10:16:46 AM CARE MANAGEMENT NOTE 02/24/2014  Patient:  Leah Dalton, Leah Dalton   Account Number:  [id number]  Date Initiated:  02/23/2014  Documentation initiated by:  Theophilus Kinds  Subjective/Objective Assessment:   Pt admitted from home with gi bleeding. Pt lives alone and will return home at discharge. Pt is independent with ADL's. Pt has home O2 with [employer] and a neb machine for home use.     Action/Plan:   No CM needs noted.   Anticipated DC Date:  02/25/2014   Anticipated DC Plan:  [place]  CM consult      Choice offered to / List presented to:             Status of service:  Completed, signed off Medicare Important Message given?  NA - LOS <3 / Initial given by admissions (If response is "NO", the following Medicare IM given date fields will be blank) Date Medicare IM given:   Medicare IM given by:   Date Additional Medicare IM given:   Additional Medicare IM given by:    Discharge Disposition:  HOME/SELF CARE  Per UR Regulation:    If discussed at Long Length of Stay Meetings, dates discussed:    Comments:  02/24/14 [place], RN BSN CM Pt discharged home today. No CM needs noted.  02/23/14 [place], RN BSN CM

## 2014-02-24 ENCOUNTER — Encounter: Payer: Self-pay | Admitting: Internal Medicine

## 2014-02-24 ENCOUNTER — Encounter (HOSPITAL_COMMUNITY): Payer: Self-pay | Admitting: Internal Medicine

## 2014-02-24 ENCOUNTER — Telehealth: Payer: Self-pay | Admitting: Gastroenterology

## 2014-02-24 DIAGNOSIS — K644 Residual hemorrhoidal skin tags: Secondary | ICD-10-CM | POA: Diagnosis present

## 2014-02-24 MED ORDER — LEVOFLOXACIN 500 MG PO TABS
500.0000 mg | ORAL_TABLET | Freq: Every day | ORAL | Status: DC
  Administered 2014-02-24: 500 mg via ORAL
  Filled 2014-02-24: qty 1

## 2014-02-24 MED ORDER — FERROUS SULFATE 325 (65 FE) MG PO TABS: 325.0000 mg | ORAL_TABLET | Freq: Every day | ORAL | Status: DC

## 2014-02-24 MED ORDER — LEVOFLOXACIN 500 MG PO TABS: 500.0000 mg | ORAL_TABLET | Freq: Every day | ORAL | Status: DC

## 2014-02-24 MED ORDER — PANTOPRAZOLE SODIUM 40 MG PO TBEC: 40.0000 mg | DELAYED_RELEASE_TABLET | Freq: Two times a day (BID) | ORAL | Status: DC

## 2014-02-24 MED ORDER — PREDNISONE 10 MG PO TABS: ORAL_TABLET | ORAL | Status: DC

## 2014-02-24 MED ORDER — PREDNISONE 20 MG PO TABS
40.0000 mg | ORAL_TABLET | Freq: Every day | ORAL | Status: DC
  Administered 2014-02-24: 40 mg via ORAL
  Filled 2014-02-24: qty 2

## 2014-02-24 NOTE — Progress Notes (Signed)
1250 Pt d/c home today. D/C instructions given with son, Nicki Reaper present. Rx called into pts pharmacy in [place] as requested & pt made aware. Cardiac monitor removed. IV site removed, catheter tip intact, pt tolerated well w/no s/s of infection noted at this time. Pt exited facility via w/c by staff & left with son.

## 2014-02-24 NOTE — Telephone Encounter (Signed)
Please make a routine hospital follow-up in next 4-6 weeks.

## 2014-02-24 NOTE — Plan of Care (Signed)
Problem: Phase II Progression Outcomes Goal: Hemodynamically stable Outcome: Completed/Met Date Met:  02/24/14

## 2014-02-24 NOTE — Progress Notes (Signed)
Subjective: She feels okay. She is having a little more trouble with  Objective: Vital signs in last 24 hours: Temp:  [98 F (36.7 C)-98.5 F (36.9 C)] 98.2 F (36.8 C) (12/01 0448) Pulse Rate:  [77-93] 78 (12/01 0448) Resp:  [10-21] 16 (12/01 0448) BP: (93-173)/(46-94) 127/63 mmHg (12/01 0448) SpO2:  [93 %-100 %] 94 % (12/01 0716) Weight change:  Last BM Date: 02/23/14  Intake/Output from previous day: 11/30 0701 - 12/01 0700 In: 982 [P.O.:922; I.V.:60] Out: 200 [Urine:200]  PHYSICAL EXAM General appearance: alert, cooperative and mild distress Resp: rhonchi bilaterally Cardio: regular rate and rhythm, S1, S2 normal, no murmur, click, rub or gallop GI: soft, non-tender; bowel sounds normal; no masses,  no organomegaly Extremities: extremities normal, atraumatic, no cyanosis or edema  Lab Results:  Results for orders placed or performed during the hospital encounter of 02/22/14 (from the past 48 hour(s))  Comprehensive metabolic panel     Status: Abnormal   Collection Time: 02/22/14  9:08 AM  Result Value Ref Range   Sodium 137 137 - 147 mEq/L   Potassium 4.0 3.7 - 5.3 mEq/L   Chloride 98 96 - 112 mEq/L   CO2 29 19 - 32 mEq/L   Glucose, Bld 133 (H) 70 - 99 mg/dL   BUN 6 6 - 23 mg/dL   Creatinine, Ser 0.65 0.50 - 1.10 mg/dL   Calcium 9.2 8.4 - 10.5 mg/dL   Total Protein 6.5 6.0 - 8.3 g/dL   Albumin 3.2 (L) 3.5 - 5.2 g/dL   AST 11 0 - 37 U/L   ALT 8 0 - 35 U/L   Alkaline Phosphatase 134 (H) 39 - 117 U/L   Total Bilirubin 0.4 0.3 - 1.2 mg/dL   GFR calc non Af Amer 89 (L) >90 mL/min   GFR calc Af Amer >90 >90 mL/min    Comment: (NOTE) The eGFR has been calculated using the CKD EPI equation. This calculation has not been validated in all clinical situations. eGFR's persistently <90 mL/min signify possible Chronic Kidney Disease.    Anion gap 10 5 - 15  CBC with Differential     Status: Abnormal   Collection Time: 02/22/14  9:08 AM  Result Value Ref Range   WBC  8.4 4.0 - 10.5 K/uL   RBC 3.25 (L) 3.87 - 5.11 MIL/uL   Hemoglobin 6.5 (LL) 12.0 - 15.0 g/dL    Comment: CRITICAL RESULT CALLED TO, READ BACK BY AND VERIFIED WITH: ANDERSON D. AT 0930A ON [id number] BY THOMPSON S.    HCT 23.4 (L) 36.0 - 46.0 %   MCV 72.0 (L) 78.0 - 100.0 fL   MCH 20.0 (L) 26.0 - 34.0 pg   MCHC 27.8 (L) 30.0 - 36.0 g/dL   RDW 19.8 (H) 11.5 - 15.5 %   Platelets 661 (H) 150 - 400 K/uL   Neutrophils Relative % 73 43 - 77 %   Lymphocytes Relative 13 12 - 46 %   Monocytes Relative 9 3 - 12 %   Eosinophils Relative 4 0 - 5 %   Basophils Relative 1 0 - 1 %   Neutro Abs 6.1 1.7 - 7.7 K/uL   Lymphs Abs 1.1 0.7 - 4.0 K/uL   Monocytes Absolute 0.8 0.1 - 1.0 K/uL   Eosinophils Absolute 0.3 0.0 - 0.7 K/uL   Basophils Absolute 0.1 0.0 - 0.1 K/uL   RBC Morphology TARGET CELLS     Comment: TEARDROP CELLS POLYCHROMASIA PRESENT HYPOCHROMIA   Troponin I  Status: None   Collection Time: 02/22/14  9:08 AM  Result Value Ref Range   Troponin I <0.30 <0.30 ng/mL    Comment:        Due to the release kinetics of cTnI, a negative result within the first hours of the onset of symptoms does not rule out myocardial infarction with certainty. If myocardial infarction is still suspected, repeat the test at appropriate intervals.   Occult blood, poc device     Status: Abnormal   Collection Time: 02/22/14  9:37 AM  Result Value Ref Range   Fecal Occult Bld POSITIVE (A) NEGATIVE  Type and screen     Status: None   Collection Time: 02/22/14 10:00 AM  Result Value Ref Range   ABO/RH(D) A POS    Antibody Screen NEG    Sample Expiration 02/25/2014    Unit Number [id number]    Blood Component Type RED CELLS,LR    Unit division 00    Status of Unit ISSUED,FINAL    Transfusion Status OK TO TRANSFUSE    Crossmatch Result Compatible    Unit Number [id number]    Blood Component Type RED CELLS,LR    Unit division 00    Status of Unit ISSUED,FINAL    Transfusion Status OK TO  TRANSFUSE    Crossmatch Result Compatible   Prepare RBC     Status: None   Collection Time: 02/22/14 10:00 AM  Result Value Ref Range   Order Confirmation ORDER PROCESSED BY BLOOD BANK   ABO/Rh     Status: None   Collection Time: 02/22/14 10:00 AM  Result Value Ref Range   ABO/RH(D) A POS   Hemoglobin     Status: Abnormal   Collection Time: 02/22/14  5:54 PM  Result Value Ref Range   Hemoglobin 8.5 (L) 12.0 - 15.0 g/dL    Comment: POST TRANSFUSION SPECIMEN  Hemoglobin     Status: Abnormal   Collection Time: 02/22/14 11:31 PM  Result Value Ref Range   Hemoglobin 9.2 (L) 12.0 - 15.0 g/dL  Comprehensive metabolic panel     Status: Abnormal   Collection Time: 02/23/14  6:13 AM  Result Value Ref Range   Sodium 139 137 - 147 mEq/L   Potassium 3.5 (L) 3.7 - 5.3 mEq/L   Chloride 99 96 - 112 mEq/L   CO2 27 19 - 32 mEq/L   Glucose, Bld 98 70 - 99 mg/dL   BUN 4 (L) 6 - 23 mg/dL   Creatinine, Ser 0.60 0.50 - 1.10 mg/dL   Calcium 8.3 (L) 8.4 - 10.5 mg/dL   Total Protein 6.4 6.0 - 8.3 g/dL   Albumin 3.2 (L) 3.5 - 5.2 g/dL   AST 16 0 - 37 U/L   ALT 9 0 - 35 U/L   Alkaline Phosphatase 128 (H) 39 - 117 U/L   Total Bilirubin 0.7 0.3 - 1.2 mg/dL   GFR calc non Af Amer >90 >90 mL/min   GFR calc Af Amer >90 >90 mL/min    Comment: (NOTE) The eGFR has been calculated using the CKD EPI equation. This calculation has not been validated in all clinical situations. eGFR's persistently <90 mL/min signify possible Chronic Kidney Disease.    Anion gap 13 5 - 15  CBC     Status: Abnormal   Collection Time: 02/23/14  6:13 AM  Result Value Ref Range   WBC 8.2 4.0 - 10.5 K/uL   RBC 4.22 3.87 - 5.11 MIL/uL   Hemoglobin 9.5 (L)  12.0 - 15.0 g/dL   HCT 31.5 (L) 36.0 - 46.0 %   MCV 74.6 (L) 78.0 - 100.0 fL   MCH 22.5 (L) 26.0 - 34.0 pg   MCHC 30.2 30.0 - 36.0 g/dL   RDW 18.8 (H) 11.5 - 15.5 %   Platelets 557 (H) 150 - 400 K/uL    ABGS No results for input(s): PHART, PO2ART, TCO2, HCO3 in the  last 72 hours.  Invalid input(s): PCO2 CULTURES No results found for this or any previous visit (from the past 240 hour(s)). Studies/Results: Dg Chest 2 View  02/22/2014   CLINICAL DATA:  Shortness of breath, right scapular pain  EXAM: CHEST  2 VIEW  COMPARISON:  06/07/2012  FINDINGS: Chronic interstitial markings/emphysematous changes. No focal consolidation. No pleural effusion or pneumothorax.  The heart is normal in size. Mild prominence the right perihilar region, likely vascular.  Visualized osseous structures are within normal limits.  IMPRESSION: No evidence of acute cardiopulmonary disease.   Electronically Signed   By: Julian Hy M.D.   On: 02/22/2014 10:53    Medications:  Prior to Admission:  Prescriptions prior to admission  Medication Sig Dispense Refill Last Dose  . albuterol (PROVENTIL HFA;VENTOLIN HFA) 108 (90 BASE) MCG/ACT inhaler Inhale 2 puffs into the lungs every 6 (six) hours as needed for wheezing.   02/22/2014 at Unknown time  . albuterol (PROVENTIL) (2.5 MG/3ML) 0.083% nebulizer solution Take 2.5 mg by nebulization 3 (three) times daily.   02/22/2014 at Unknown time  . aspirin EC 81 MG tablet Take 81 mg by mouth daily.   02/21/2014 at Unknown time  . calcium carbonate (OS-CAL) 600 MG TABS Take 600 mg by mouth 2 (two) times daily with a meal.   02/21/2014 at Unknown time  . Cholecalciferol (VITAMIN D-3) 5000 UNITS TABS Take 1 tablet by mouth daily.   02/21/2014 at Unknown time  . famotidine (PEPCID AC) 10 MG chewable tablet Chew 10 mg by mouth 2 (two) times daily.   02/21/2014 at Unknown time  . Fluticasone-Salmeterol (ADVAIR) 250-50 MCG/DOSE AEPB Inhale 1 puff into the lungs every 12 (twelve) hours.   02/22/2014 at Unknown time  . furosemide (LASIX) 40 MG tablet Take 40 mg by mouth daily.   02/21/2014 at Unknown time  . ipratropium (ATROVENT) 0.02 % nebulizer solution Take 500 mcg by nebulization 3 (three) times daily.   02/22/2014 at Unknown time  . ALPRAZolam  (XANAX) 0.5 MG tablet Take 1 tablet (0.5 mg total) by mouth 3 (three) times daily as needed for anxiety or sleep. (Patient not taking: Reported on 02/22/2014) 30 tablet 0   . benzonatate (TESSALON) 100 MG capsule Take 1 capsule (100 mg total) by mouth 3 (three) times daily. (Patient not taking: Reported on 02/22/2014) 20 capsule 0   . dextromethorphan-guaiFENesin (MUCINEX DM) 30-600 MG per 12 hr tablet Take 1 tablet by mouth every 12 (twelve) hours.   unknown  . fluticasone (FLONASE) 50 MCG/ACT nasal spray Place 2 sprays into the nose daily. (Patient not taking: Reported on 02/22/2014) 1 g 0   . levofloxacin (LEVAQUIN) 500 MG tablet Take 1 tablet (500 mg total) by mouth daily. (Patient not taking: Reported on 02/22/2014) 7 tablet 0   . predniSONE (DELTASONE) 20 MG tablet Take 2 tablets daily for 3 days, then 1 tablet daily for 3 days, then half tablet daily for 3 days, then STOP. (Patient not taking: Reported on 02/22/2014) 12 tablet 0    Scheduled: . furosemide  40 mg Oral  Daily  . ipratropium-albuterol  3 mL Nebulization TID  . levofloxacin  500 mg Oral Daily  . mometasone-formoterol  2 puff Inhalation BID  . pantoprazole  40 mg Oral BID  . predniSONE  40 mg Oral Q breakfast  . sodium chloride  3 mL Intravenous Q12H   Continuous: . sodium chloride 75 mL/hr at 02/23/14 2139   SNK:[id number] **OR** ondansetron (ZOFRAN) IV  Assesment: She was admitted with acute blood loss anemia. She has pretty severe COPD at baseline. She had previously had polycythemia and it's felt that her anemia is multifactorial. She does have significant reflux esophagitis based on her EGD. She also has external hemorrhoids. Principal Problem:   Acute blood loss anemia Active Problems:   Acute-on-chronic respiratory failure   Polycythemia   Symptomatic anemia   Reflux esophagitis    Plan: Discharge home today.    LOS: 2 days   Claudia Greenley L 02/24/2014, 9:05 AM

## 2014-02-24 NOTE — Telephone Encounter (Signed)
APPT MADE AND PATIENT AWARE °

## 2014-02-25 NOTE — Discharge Summary (Signed)
Physician Discharge Summary  Patient ID: Leah Dalton MRN: [id number] DOB/AGE: 1946-02-06 68 y.o. Primary Care Physician:Shealeigh Dunstan L, MD Admit date: 02/22/2014 Discharge date: 02/25/2014    Discharge Diagnoses:   Principal Problem:   Acute blood loss anemia Active Problems:   COPD with exacerbation   Acute-on-chronic respiratory failure   Polycythemia   Symptomatic anemia   Reflux esophagitis   External hemorrhoids     Medication List    TAKE these medications        albuterol 108 (90 BASE) MCG/ACT inhaler  Commonly known as:  PROVENTIL HFA;VENTOLIN HFA  Inhale 2 puffs into the lungs every 6 (six) hours as needed for wheezing.     albuterol (2.5 MG/3ML) 0.083% nebulizer solution  Commonly known as:  PROVENTIL  Take 2.5 mg by nebulization 3 (three) times daily.     ALPRAZolam 0.5 MG tablet  Commonly known as:  XANAX  Take 1 tablet (0.5 mg total) by mouth 3 (three) times daily as needed for anxiety or sleep.     aspirin EC 81 MG tablet  Take 81 mg by mouth daily.     benzonatate 100 MG capsule  Commonly known as:  TESSALON  Take 1 capsule (100 mg total) by mouth 3 (three) times daily.     calcium carbonate 600 MG Tabs tablet  Commonly known as:  OS-CAL  Take 600 mg by mouth 2 (two) times daily with a meal.     dextromethorphan-guaiFENesin 30-600 MG per 12 hr tablet  Commonly known as:  MUCINEX DM  Take 1 tablet by mouth every 12 (twelve) hours.     famotidine 10 MG chewable tablet  Commonly known as:  PEPCID AC  Chew 10 mg by mouth 2 (two) times daily.     ferrous sulfate 325 (65 FE) MG tablet  Commonly known as:  FERROUSUL  Take 1 tablet (325 mg total) by mouth daily with breakfast.     fluticasone 50 MCG/ACT nasal spray  Commonly known as:  FLONASE  Place 2 sprays into the nose daily.     Fluticasone-Salmeterol 250-50 MCG/DOSE Aepb  Commonly known as:  ADVAIR  Inhale 1 puff into the lungs every 12 (twelve) hours.     furosemide 40 MG  tablet  Commonly known as:  LASIX  Take 40 mg by mouth daily.     ipratropium 0.02 % nebulizer solution  Commonly known as:  ATROVENT  Take 500 mcg by nebulization 3 (three) times daily.     levofloxacin 500 MG tablet  Commonly known as:  LEVAQUIN  Take 1 tablet (500 mg total) by mouth daily.     levofloxacin 500 MG tablet  Commonly known as:  LEVAQUIN  Take 1 tablet (500 mg total) by mouth daily.     pantoprazole 40 MG tablet  Commonly known as:  PROTONIX  Take 1 tablet (40 mg total) by mouth 2 (two) times daily.     predniSONE 10 MG tablet  Commonly known as:  DELTASONE  Take 4 daily for 3 days, 3 daily for 3 days, 2 daily for 3 days then 1 daily for 3 days and then stop     Vitamin D-3 5000 UNITS Tabs  Take 1 tablet by mouth daily.        Discharged Condition: Improved    Consults: GI  Significant Diagnostic Studies: Dg Chest 2 View  02/22/2014   CLINICAL DATA:  Shortness of breath, right scapular pain  EXAM: CHEST  2 VIEW  COMPARISON:  06/07/2012  FINDINGS: Chronic interstitial markings/emphysematous changes. No focal consolidation. No pleural effusion or pneumothorax.  The heart is normal in size. Mild prominence the right perihilar region, likely vascular.  Visualized osseous structures are within normal limits.  IMPRESSION: No evidence of acute cardiopulmonary disease.   Electronically Signed   By: Julian Hy M.D.   On: 02/22/2014 10:53    Lab Results: Basic Metabolic Panel:  Recent Labs  02/23/14 0613  NA 139  K 3.5*  CL 99  CO2 27  GLUCOSE 98  BUN 4*  CREATININE 0.60  CALCIUM 8.3*   Liver Function Tests:  Recent Labs  02/23/14 0613  AST 16  ALT 9  ALKPHOS 128*  BILITOT 0.7  PROT 6.4  ALBUMIN 3.2*     CBC:  Recent Labs  02/22/14 2331 02/23/14 0613  WBC  --  8.2  HGB 9.2* 9.5*  HCT  --  31.5*  MCV  --  74.6*  PLT  --  557*    No results found for this or any previous visit (from the past 240 hour(s)).   Hospital  Course: This is a 68 year old who came to the emergency department because of weakness and shortness of breath. She is known to have severe COPD with oxygen at home and she thought she was having an exacerbation of her COPD but she was found to have a hemoglobin level of about 6.5. She had previously had trouble with polycythemia. She was given blood transfusions and had EGD and colonoscopy. Her hemoglobin level held. She was placed on iron. She did have some shortness of breath and was treated with redness and antibiotics. Her endoscopy showed that she had severe esophagitis with ulceration. She was hemodynamically stable and back at baseline at time of discharge  Discharge Exam: Blood pressure 127/63, pulse 78, temperature 98.2 F (36.8 C), temperature source Oral, resp. rate 16, height 5\' 4"  (1.626 m), weight 71.442 kg (157 lb 8 oz), SpO2 94 %. She is awake and alert. Her chest is clear. Her heart is regular.  Disposition: Home she does not want home health services      Signed: Nafeesah Lapaglia L   02/25/2014, 9:13 AM

## 2014-04-10 ENCOUNTER — Ambulatory Visit: Payer: Medicare Other | Admitting: Gastroenterology

## 2014-04-10 ENCOUNTER — Ambulatory Visit (INDEPENDENT_AMBULATORY_CARE_PROVIDER_SITE_OTHER): Payer: Medicare Other | Admitting: Gastroenterology

## 2014-04-10 ENCOUNTER — Encounter: Payer: Self-pay | Admitting: Gastroenterology

## 2014-04-10 VITALS — BP 118/71 | HR 88 | Temp 98.2°F | Ht 64.0 in | Wt 155.2 lb

## 2014-04-10 DIAGNOSIS — Q2733 Arteriovenous malformation of digestive system vessel: Secondary | ICD-10-CM

## 2014-04-10 DIAGNOSIS — K5520 Angiodysplasia of colon without hemorrhage: Secondary | ICD-10-CM | POA: Insufficient documentation

## 2014-04-10 DIAGNOSIS — D509 Iron deficiency anemia, unspecified: Secondary | ICD-10-CM | POA: Insufficient documentation

## 2014-04-10 DIAGNOSIS — K2100 Gastro-esophageal reflux disease with esophagitis, without bleeding: Secondary | ICD-10-CM

## 2014-04-10 NOTE — Progress Notes (Signed)
Primary Care Physician:  Alonza Bogus, MD  Primary Gastroenterologist:  Garfield Cornea, MD   Chief Complaint  Patient presents with  . Follow-up    HPI:  Leah Dalton is a 69 y.o. female here for hospital follow-up. She was seen back in November 2015 for anemia and heme-positive stool. She has COPD with oxygen dependency and had presented to the hospital with progressive weakness and dyspnea. She was found to have a hemoglobin of 6.5. MCV 72. History of long-standing GERD taking Pepcid Complete when she presented to the hospital. 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 Previously followed by Dr. Tressie Stalker from 2009 up until she moved to [place]. Has not been seen by hematology since prior to 2014 when she moved back.  Father with history of precancerous polyps.  During her hospitalization she underwent an EGD and colonoscopy. She had ulcerative/erosive reflux esophagitis and small hiatal hernia. She is placed on PPI twice a day. 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 Terminal ileum up to 5 cm was normal. 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 Noted that if she had recurrent anemia or bleeding she would need a capsule endoscopy. She was noted to have a history of chronic daily epistaxis (spots of blood noted with blowing nose). He received 2 units of packed red blood cells during her hospitalization.  Last phlebotomy in 01/2012 ([place]). Patient notes that she was in ER 05/2012 with flare of COPD, ferritin 44, Hgb 13.9 at that time. She requested test because she is aware of need to have monitored.  Clinically she is feeling better. Denies any fatigue, melena, rectal bleeding, constipation, diarrhea, abdominal pain. Heartburn is much better controlled on pantoprazole, currently twice  daily. Is noted that she was placed on iron therapy when she went home from the hospital.  Current Outpatient Prescriptions  Medication Sig Dispense Refill  . albuterol (PROVENTIL HFA;VENTOLIN HFA) 108 (90 BASE) MCG/ACT inhaler Inhale 2 puffs into the lungs every 6 (six) hours as needed for wheezing.    Marland Kitchen albuterol (PROVENTIL) (2.5 MG/3ML) 0.083% nebulizer solution Take 2.5 mg by nebulization 3 (three) times daily.    Marland Kitchen aspirin EC 81 MG tablet Take 81 mg by mouth daily. Patient takes M,W,F    . calcium carbonate (OS-CAL) 600 MG TABS Take 600 mg by mouth 2 (two) times daily with a meal.    . Cholecalciferol (VITAMIN D-3) 5000 UNITS TABS Take 1 tablet by mouth daily.    Marland Kitchen dextromethorphan-guaiFENesin (MUCINEX DM) 30-600 MG per 12 hr tablet Take 1 tablet by mouth every 12 (twelve) hours.    . ferrous sulfate (FERROUSUL) 325 (65 FE) MG tablet Take 1 tablet (325 mg total) by mouth daily with breakfast. 30 tablet 3  . fluticasone (FLONASE) 50 MCG/ACT nasal spray Place 2 sprays into the nose daily. 1 g 0  . Fluticasone-Salmeterol (ADVAIR) 250-50 MCG/DOSE AEPB Inhale 1 puff into the lungs every 12 (twelve) hours.    . furosemide (LASIX) 40 MG tablet Take 40 mg by mouth daily.    Marland Kitchen ipratropium (ATROVENT) 0.02 % nebulizer solution Take 500 mcg by nebulization 3 (three) times daily.    . pantoprazole (PROTONIX) 40 MG tablet Take 1 tablet (40 mg total) by mouth 2 (two) times daily. 60 tablet 5   No current facility-administered medications for  this visit.    Allergies as of 04/10/2014 - Review Complete 04/10/2014  Allergen Reaction Noted  . Codeine Itching 06/07/2012  . Daliresp [roflumilast] Diarrhea 06/07/2012  . Keflex [cephalexin] Other (See Comments) 06/07/2012  . Penicillins Itching 06/07/2012    Past Medical History  Diagnosis Date  . COPD (chronic obstructive pulmonary disease)     O2 dependent, started 05/31/2012  . Polycythemia     Has had to have phlebotomies in the pas  . History of  tobacco abuse   . Osteoporosis     Past Surgical History  Procedure Laterality Date  . Abdominal hysterectomy    . Cataract extraction    . Esophagogastroduodenoscopy N/A 02/23/2014    RMR:  Ulcerative/erosive esophagitis. small hiatal hernia  . 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 Engorged internal hemorrhoids; colonic diverticulosis.    No family history on file.  History   Social History  . Marital Status: Divorced    Spouse Name: N/A    Number of Children: N/A  . Years of Education: N/A   Occupational History  . Not on file.   Social History Main Topics  . Smoking status: Former Research scientist (life sciences)  . Smokeless tobacco: Not on file  . Alcohol Use: Yes     Comment: 3 beer nightly  . Drug Use: No  . Sexual Activity: Not on file   Other Topics Concern  . Not on file   Social History Narrative      ROS:  General: Negative for anorexia, weight loss, fever, chills, fatigue, weakness. Eyes: Negative for vision changes.  ENT: Negative for hoarseness, difficulty swallowing , nasal congestion. CV: Negative for chest pain, angina, palpitations, dyspnea on exertion, peripheral edema.  Respiratory: Negative for dyspnea at rest, dyspnea on exertion, cough, sputum, wheezing.  GI: See history of present illness. GU:  Negative for dysuria, hematuria, urinary incontinence, urinary frequency, nocturnal urination.  MS: Negative for joint pain, low back pain.  Derm: Negative for rash or itching.  Neuro: Negative for weakness, abnormal sensation, seizure, frequent headaches, memory loss, confusion.  Psych: Negative for anxiety, depression, suicidal ideation, hallucinations.  Endo: Negative for unusual weight change.  Heme: Negative for bruising or bleeding. Allergy: Negative for rash or hives.    Physical Examination:  BP 118/71 mmHg  Pulse 88  Temp(Src) 98.2 F (36.8 C) (Oral)  Ht 5\' 4"  (1.626 m)  Wt 155 lb 3.2 oz  (70.398 kg)  BMI 26.63 kg/m2   General: Well-nourished, well-developed in no acute distress.  Head: Normocephalic, atraumatic.   Eyes: Conjunctiva pink, no icterus. Mouth: Oropharyngeal mucosa moist and pink , no lesions erythema or exudate. Neck: Supple without thyromegaly, masses, or lymphadenopathy.  Lungs: Clear to auscultation bilaterally.  Heart: Regular rate and rhythm, no murmurs rubs or gallops.  Abdomen: Bowel sounds are normal, nontender, nondistended, no hepatosplenomegaly or masses, no abdominal bruits or    hernia , no rebound or guarding.   Rectal: Not performed Extremities: No lower extremity edema. No clubbing or deformities.  Neuro: Alert and oriented x 4 , grossly normal neurologically.  Skin: Warm and dry, no rash or jaundice.   Psych: Alert and cooperative, normal mood and affect.  Labs: Lab Results  Component Value Date   CREATININE 0.60 02/23/2014   BUN 4* 02/23/2014   NA 139 02/23/2014   K 3.5* 02/23/2014   CL 99 02/23/2014   CO2 27 02/23/2014   Lab Results  Component Value Date   ALT 9 02/23/2014   AST 16 02/23/2014   ALKPHOS 128* 02/23/2014   BILITOT 0.7 02/23/2014   Lab Results  Component Value Date   WBC 8.2 02/23/2014   HGB 9.5* 02/23/2014   HCT 31.5* 02/23/2014   MCV 74.6* 02/23/2014   PLT 557* 02/23/2014      Imaging Studies: No results found.

## 2014-04-10 NOTE — Assessment & Plan Note (Signed)
69 year old female with history of hereditary hemochromatosis previously phlebotomized (last time in 2013) who was recently hospitalized for profound symptomatic microcytic anemia. She was noted to have ulcerative reflux esophagitis, 2 nonbleeding colonic AVMs which were treated. She received 2 units of packed red blood cells. She is currently on iron supplementation. Clinically she is doing well at this time.  We need to update her hemoglobin, iron, ferritin. Cautioned patient regarding chronic iron therapy in the setting of hemochromatosis. We will monitor closely and likely discontinue in the near future. Encouraged patient to follow-up with her son to make sure he was checked for hemachromatosis. If she displays evidence of worsening anemia or persistent anemia or GI bleeding, she may require small bowel capsule endoscopy. Further recommendations to follow.

## 2014-04-10 NOTE — Patient Instructions (Signed)
1. Please have your blood work done next week as discussed. For now continue current medication regimen including iron therapy. Further recommendations to follow lab results. We will not plan on leaving on iron replacement therapy long-term due to your history of hemochromatosis.

## 2014-04-15 NOTE — Progress Notes (Signed)
cc'ed to pcp °

## 2014-04-29 DIAGNOSIS — Q2733 Arteriovenous malformation of digestive system vessel: Secondary | ICD-10-CM | POA: Diagnosis not present

## 2014-04-29 DIAGNOSIS — K209 Esophagitis, unspecified: Secondary | ICD-10-CM | POA: Diagnosis not present

## 2014-04-29 DIAGNOSIS — K210 Gastro-esophageal reflux disease with esophagitis: Secondary | ICD-10-CM | POA: Diagnosis not present

## 2014-04-29 DIAGNOSIS — D649 Anemia, unspecified: Secondary | ICD-10-CM | POA: Diagnosis not present

## 2014-04-29 DIAGNOSIS — J449 Chronic obstructive pulmonary disease, unspecified: Secondary | ICD-10-CM | POA: Diagnosis not present

## 2014-04-29 DIAGNOSIS — D509 Iron deficiency anemia, unspecified: Secondary | ICD-10-CM | POA: Diagnosis not present

## 2014-05-18 ENCOUNTER — Telehealth: Payer: Self-pay | Admitting: Internal Medicine

## 2014-05-18 NOTE — Telephone Encounter (Signed)
Routing to LSL. There are no results in epic. Do you have any on your desk?

## 2014-05-18 NOTE — Telephone Encounter (Signed)
Pt said that she had labs done 04/29/2014 and hasn't heard back from [place] regarding her results. Please advise and call her at [phone]

## 2014-05-18 NOTE — Telephone Encounter (Signed)
If it is not in the file cabinet then I don't have it. Please find out where she had the labs done and request if needed.

## 2014-05-19 NOTE — Telephone Encounter (Signed)
Labs from 04/29/2014 White blood cell count 7600, hemoglobin 14, hematocrit 45, platelets 429,000, ferritin 36.  Please let patient know that her hemoglobin is now normal. Her ferritin is in a good range. I would recommend that she stop oral iron supplementation, given her history of hemochromatosis.  Let's recheck her CBC, ferritin, iron/TIBC in 3 months.

## 2014-05-19 NOTE — Telephone Encounter (Signed)
Labs are in your cart.

## 2014-05-19 NOTE — Telephone Encounter (Signed)
Pt is aware. She is requesting a copy of labs mailed to her. She said she has to go to her PCP in May and wants to know if she can just have her blood work done then?

## 2014-05-19 NOTE — Telephone Encounter (Signed)
That's fine but we need to make sure my requested labs are done. She should let us know once labs are drawn so we can be on look out for results.

## 2014-05-20 ENCOUNTER — Other Ambulatory Visit: Payer: Self-pay | Admitting: Gastroenterology

## 2014-05-20 DIAGNOSIS — D509 Iron deficiency anemia, unspecified: Secondary | ICD-10-CM

## 2014-05-20 NOTE — Telephone Encounter (Signed)
Letter and copy of labs mailed to the pt.  Lab order on file.

## 2014-05-21 ENCOUNTER — Telehealth: Payer: Self-pay | Admitting: Internal Medicine

## 2014-05-21 NOTE — Telephone Encounter (Signed)
PLEASE CALL PATIENT, SHE IS HAVING TROUBLE LOOKING AT HER LABS ONLINE

## 2014-05-21 NOTE — Telephone Encounter (Signed)
Spoke with the pt. Clarified what she received in mychart was a copy of the letter that I has sent to her yesterday.

## 2014-07-21 ENCOUNTER — Other Ambulatory Visit: Payer: Self-pay

## 2014-07-21 DIAGNOSIS — D509 Iron deficiency anemia, unspecified: Secondary | ICD-10-CM

## 2014-07-29 DIAGNOSIS — D509 Iron deficiency anemia, unspecified: Secondary | ICD-10-CM | POA: Diagnosis not present

## 2014-07-29 DIAGNOSIS — J449 Chronic obstructive pulmonary disease, unspecified: Secondary | ICD-10-CM | POA: Diagnosis not present

## 2014-07-29 DIAGNOSIS — K209 Esophagitis, unspecified: Secondary | ICD-10-CM | POA: Diagnosis not present

## 2014-07-29 DIAGNOSIS — D649 Anemia, unspecified: Secondary | ICD-10-CM | POA: Diagnosis not present

## 2014-08-18 ENCOUNTER — Telehealth: Payer: Self-pay | Admitting: Gastroenterology

## 2014-08-18 NOTE — Telephone Encounter (Signed)
Leah Dalton, please get a new copy of these labs.

## 2014-08-18 NOTE — Telephone Encounter (Signed)
Patient had labs dated 07/30/2014 from [facility].  lab results were not legible , specifically could not make out total iron, iron binding capacity, percent saturation, ferritin.   CBC showed White blood cell count 7200, hemoglobin 13.8, hematocrit 43, MCV 96, platelets 331,000.  Please obtain copy of results as outlined above that I could not read.

## 2014-09-01 NOTE — Telephone Encounter (Signed)
I spoke with Katie with [facility] and she is sending them over

## 2014-09-01 NOTE — Telephone Encounter (Signed)
Labs received and placed in your box

## 2014-09-04 NOTE — Telephone Encounter (Addendum)
Better copy received for May 2016 labs. Ferritin was 68 Iron 187 TIBC 234 Iron saturations 80%.   H/O hemochromatosis with recent IDA back in 01/2014. See OV note 03/2014 for details. No further IDA.  Please verify that patient in NO longer taking iron.  Her iron and iron saturations are high but ferritin okay. No need to phlebotomize.  Needs OV in 3 months with follow labs beforehand. CBC Iron/TIBC Ferritin.

## 2014-09-08 ENCOUNTER — Other Ambulatory Visit: Payer: Self-pay | Admitting: Gastroenterology

## 2014-09-08 NOTE — Telephone Encounter (Signed)
RECALL MADE 

## 2014-09-08 NOTE — Telephone Encounter (Signed)
Pt is aware. Lab order on file. She is not taking iron.   Please schedule ov in 3 months.

## 2014-10-27 ENCOUNTER — Encounter: Payer: Self-pay | Admitting: Internal Medicine

## 2014-10-27 ENCOUNTER — Other Ambulatory Visit: Payer: Self-pay

## 2014-11-11 DIAGNOSIS — D509 Iron deficiency anemia, unspecified: Secondary | ICD-10-CM | POA: Diagnosis not present

## 2014-11-11 DIAGNOSIS — D490 Neoplasm of unspecified behavior of digestive system: Secondary | ICD-10-CM | POA: Diagnosis not present

## 2014-12-02 DIAGNOSIS — K209 Esophagitis, unspecified: Secondary | ICD-10-CM | POA: Diagnosis not present

## 2014-12-02 DIAGNOSIS — Z23 Encounter for immunization: Secondary | ICD-10-CM | POA: Diagnosis not present

## 2014-12-02 DIAGNOSIS — J449 Chronic obstructive pulmonary disease, unspecified: Secondary | ICD-10-CM | POA: Diagnosis not present

## 2014-12-02 DIAGNOSIS — L989 Disorder of the skin and subcutaneous tissue, unspecified: Secondary | ICD-10-CM | POA: Diagnosis not present

## 2015-03-18 ENCOUNTER — Telehealth: Payer: Self-pay | Admitting: Gastroenterology

## 2015-03-18 NOTE — Telephone Encounter (Signed)
Patient had updated labs on an outside source dated 11/12/2014 Iron 76, TIBC 273, iron saturations 28%, white blood cell count 7800, hemoglobin 13.6, MCV 98.6, platelets 382,000, ferritin 18 low.  Her iron and ferritin have dropped. Iron previously stopped given history of hemochromatosis and elevated serum iron. H/o colonic AVMs.  She is due for follow up OV at this time (IDA, AVMs, hemochromatosis). Restart iron, ferrous sulfate 325mg  bid.

## 2015-03-24 ENCOUNTER — Encounter: Payer: Self-pay | Admitting: Internal Medicine

## 2015-03-24 NOTE — Telephone Encounter (Signed)
Patient aware of plan. Will increase dietary iron OR take one iron pill per day. She is currently doing very well on pantoprazole once daily only.   She has appointment in 04/2015 with me and we will update labs at that time.   Patient very appreciative of phone call.

## 2015-03-24 NOTE — Telephone Encounter (Signed)
Pt called- she received the letter in mychart. Went over everything with her. She is somewhat upset and wants to know why it took so long to get her results.

## 2015-03-24 NOTE — Telephone Encounter (Signed)
MADE APPT AND MAILED LETTER

## 2015-03-24 NOTE — Telephone Encounter (Signed)
Letter mailed to the pt. Asked her to also call the office to schedule an office visit.

## 2015-03-25 ENCOUNTER — Emergency Department (HOSPITAL_COMMUNITY): Payer: Medicare Other

## 2015-03-25 ENCOUNTER — Emergency Department (HOSPITAL_COMMUNITY)
Admission: EM | Admit: 2015-03-25 | Discharge: 2015-03-25 | Disposition: A | Discharge status: Home / Self Care | Payer: Medicare Other | Attending: Emergency Medicine | Admitting: Emergency Medicine

## 2015-03-25 ENCOUNTER — Encounter (HOSPITAL_COMMUNITY): Payer: Self-pay | Admitting: Emergency Medicine

## 2015-03-25 DIAGNOSIS — R0602 Shortness of breath: Secondary | ICD-10-CM | POA: Insufficient documentation

## 2015-03-25 DIAGNOSIS — J449 Chronic obstructive pulmonary disease, unspecified: Secondary | ICD-10-CM | POA: Insufficient documentation

## 2015-03-25 DIAGNOSIS — R002 Palpitations: Secondary | ICD-10-CM | POA: Diagnosis not present

## 2015-03-25 DIAGNOSIS — R0789 Other chest pain: Secondary | ICD-10-CM | POA: Diagnosis present

## 2015-03-25 DIAGNOSIS — D751 Secondary polycythemia: Secondary | ICD-10-CM | POA: Diagnosis not present

## 2015-03-25 DIAGNOSIS — Z880 Allergy status to penicillin: Secondary | ICD-10-CM | POA: Insufficient documentation

## 2015-03-25 DIAGNOSIS — Z79899 Other long term (current) drug therapy: Secondary | ICD-10-CM | POA: Diagnosis not present

## 2015-03-25 DIAGNOSIS — Z87891 Personal history of nicotine dependence: Secondary | ICD-10-CM | POA: Insufficient documentation

## 2015-03-25 DIAGNOSIS — Z7951 Long term (current) use of inhaled steroids: Secondary | ICD-10-CM | POA: Insufficient documentation

## 2015-03-25 DIAGNOSIS — R609 Edema, unspecified: Secondary | ICD-10-CM | POA: Diagnosis not present

## 2015-03-25 DIAGNOSIS — R079 Chest pain, unspecified: Secondary | ICD-10-CM | POA: Diagnosis not present

## 2015-03-25 DIAGNOSIS — M810 Age-related osteoporosis without current pathological fracture: Secondary | ICD-10-CM | POA: Diagnosis not present

## 2015-03-25 DIAGNOSIS — Z7982 Long term (current) use of aspirin: Secondary | ICD-10-CM | POA: Diagnosis not present

## 2015-03-25 DIAGNOSIS — R05 Cough: Secondary | ICD-10-CM | POA: Diagnosis not present

## 2015-03-25 LAB — TROPONIN I
Troponin I: <0.03 ng/mL (ref <0.031)
Troponin I: <0.03 ng/mL (ref <0.031)

## 2015-03-25 LAB — BASIC METABOLIC PANEL
Anion gap: 5 (ref 5–15)
BUN: 6 mg/dL (ref 6–20)
CHLORIDE: 101 mmol/L (ref 101–111)
CO2: 28 mmol/L (ref 22–32)
CREATININE: 0.49 mg/dL (ref 0.44–1.00)
Calcium: 9 mg/dL (ref 8.9–10.3)
GFR calc Af Amer: >60 mL/min (ref >60)
GFR calc non Af Amer: >60 mL/min (ref >60)
Glucose, Bld: 124 mg/dL — ABNORMAL HIGH (ref 65–99)
Potassium: 4.1 mmol/L (ref 3.5–5.1)
Sodium: 134 mmol/L — ABNORMAL LOW (ref 135–145)

## 2015-03-25 LAB — CBC
HCT: 43.3 % (ref 36.0–46.0)
Hemoglobin: 14.1 g/dL (ref 12.0–15.0)
MCH: 30.5 pg (ref 26.0–34.0)
MCHC: 32.6 g/dL (ref 30.0–36.0)
MCV: 93.5 fL (ref 78.0–100.0)
PLATELETS: 343 10*3/uL (ref 150–400)
RBC: 4.63 MIL/uL (ref 3.87–5.11)
RDW: 14.2 % (ref 11.5–15.5)
WBC: 7.4 10*3/uL (ref 4.0–10.5)

## 2015-03-25 LAB — I-STAT TROPONIN, ED: Troponin i, poc: 0 ng/mL (ref 0.00–0.08)

## 2015-03-25 LAB — D-DIMER, QUANTITATIVE: D-Dimer, Quant: <0.27 ug/mL-FEU (ref 0.00–0.50)

## 2015-03-25 MED ORDER — PREDNISONE 20 MG PO TABS
40.0000 mg | ORAL_TABLET | Freq: Once | ORAL | Status: AC
  Administered 2015-03-25: 40 mg via ORAL
  Filled 2015-03-25: qty 2

## 2015-03-25 MED ORDER — IPRATROPIUM-ALBUTEROL 0.5-2.5 (3) MG/3ML IN SOLN
3.0000 mL | RESPIRATORY_TRACT | Status: DC
  Administered 2015-03-25: 3 mL via RESPIRATORY_TRACT
  Filled 2015-03-25: qty 3

## 2015-03-25 NOTE — ED Notes (Signed)
MD Mesner at bedside  °

## 2015-03-25 NOTE — ED Provider Notes (Signed)
CSN: [id number]     Arrival date & time 03/25/15  1032 History  By signing my name below, I, Stephania Fragmin, attest that this documentation has been prepared under the direction and in the presence of Merrily Pew, MD. Electronically Signed: Stephania Fragmin, ED Scribe. 03/25/2015. 2:34 PM.    Chief Complaint  Patient presents with  . Chest Pain   Patient is a 69 y.o. female presenting with chest pain. The history is provided by the patient. No language interpreter was used.  Chest Pain Pain location:  Epigastric Pain quality: tightness   Pain radiates to:  Does not radiate Pain radiates to the back: no   Duration:  1 month Timing:  Intermittent Chronicity:  Recurrent Context: at rest   Relieved by:  None tried Worsened by:  Nothing tried Ineffective treatments:  None tried Associated symptoms: palpitations and shortness of breath   Associated symptoms: no diaphoresis, no nausea and not vomiting   Risk factors: no birth control, no high cholesterol, no hypertension, no immobilization and no surgery     HPI Comments: MAESA PAINE is a 69 y.o. female with a history of COPD, polycythemia, and osteoporosis, who presents to the Emergency Department complaining of generalized center chest tightness that began this morning while patient was sitting in her chair. She states she felt SOB and hot during the episode. Patient has had intermittent episodes of generalized chest tightness over the past month, but she states the episode this morning was the most severe and "scared [her] enough" to come to the ED. She states over the past month "it feels like my heart was skipping beats." She denies any known triggers for her symptoms. She reports a history of smoking cigarettes but quit 3-4 years ago. She notes a history of GERD but states this feels different, adding she did not eat breakfast this morning. She denies any recent travel or estrogen therapies. She denies a personal history of MI, although she  states her father had an MI at age 35; he died at age 64 from cancer. She denies a personal history of CA. She also denies a history of hypertension, hyperlipidemia, hepatic or renal conditions.  She denies nausea, vomiting, or diaphoresis.   PCP: [name]  Past Medical History  Diagnosis Date  . COPD (chronic obstructive pulmonary disease) ([place])     O2 dependent, started 05/31/2012  . Polycythemia     Has had to have phlebotomies in the pas  . History of tobacco abuse   . Osteoporosis    Past Surgical History  Procedure Laterality Date  . Abdominal hysterectomy    . Cataract extraction    . Esophagogastroduodenoscopy N/A 02/23/2014    RMR:  Ulcerative/erosive esophagitis. small hiatal hernia  . 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 Engorged internal hemorrhoids; colonic diverticulosis.   No family history on file. Social History  Substance Use Topics  . Smoking status: Former Research scientist (life sciences)  . Smokeless tobacco: None  . Alcohol Use: Yes     Comment: 3 beer nightly   OB History    No data available     Review of Systems  Constitutional: Negative for diaphoresis.  Respiratory: Positive for shortness of breath.   Cardiovascular: Positive for chest pain and palpitations.  Gastrointestinal: Negative for nausea and vomiting.  All other systems reviewed and are negative.  Allergies  Codeine; Daliresp; Keflex; and Penicillins  Home Medications   Prior to  Admission medications   Medication Sig Start Date End Date Taking? Authorizing Provider  albuterol (PROVENTIL HFA;VENTOLIN HFA) 108 (90 BASE) MCG/ACT inhaler Inhale 2 puffs into the lungs every 6 (six) hours as needed for wheezing.   Yes Historical Provider, MD  albuterol (PROVENTIL) (2.5 MG/3ML) 0.083% nebulizer solution Take 2.5 mg by nebulization 3 (three) times daily.   Yes Historical Provider, MD  aspirin EC 81 MG tablet Take 81 mg by mouth daily.    Yes  Historical Provider, MD  calcium carbonate (OS-CAL) 600 MG TABS Take 600 mg by mouth 2 (two) times daily with a meal.   Yes Historical Provider, MD  Cholecalciferol (VITAMIN D-3) 5000 UNITS TABS Take 1 tablet by mouth daily.   Yes Historical Provider, MD  dextromethorphan-guaiFENesin (MUCINEX DM) 30-600 MG per 12 hr tablet Take 1 tablet by mouth daily.    Yes Historical Provider, MD  ferrous sulfate (FERROUSUL) 325 (65 FE) MG tablet Take 1 tablet (325 mg total) by mouth daily with breakfast. 02/24/14  Yes Sinda Du, MD  fluticasone [facility]) 50 MCG/ACT nasal spray Place 2 sprays into the nose daily. 06/10/12  Yes Nimish Luther Parody, MD  Fluticasone-Salmeterol (ADVAIR) 250-50 MCG/DOSE AEPB Inhale 1 puff into the lungs every 12 (twelve) hours.   Yes Historical Provider, MD  furosemide (LASIX) 40 MG tablet Take 40 mg by mouth daily as needed for fluid.    Yes Historical Provider, MD  ipratropium (ATROVENT) 0.02 % nebulizer solution Take 500 mcg by nebulization 3 (three) times daily.   Yes Historical Provider, MD  pantoprazole (PROTONIX) 40 MG tablet Take 1 tablet (40 mg total) by mouth 2 (two) times daily. 02/24/14  Yes Sinda Du, MD   BP 171/82 mmHg  Pulse 78  Temp(Src) 97.1 F (36.2 C) (Oral)  Resp 14  Ht 5\' 5"  (1.651 m)  Wt 160 lb (72.576 kg)  BMI 26.63 kg/m2  SpO2 96% Physical Exam  Constitutional: She is oriented to person, place, and time. She appears well-developed and well-nourished. No distress.  HENT:  Head: Normocephalic and atraumatic.  Eyes: Conjunctivae and EOM are normal.  Neck: Neck supple. No tracheal deviation present.  Cardiovascular: Normal rate.   Pulmonary/Chest: Effort normal. No respiratory distress.  Musculoskeletal: Normal range of motion. She exhibits edema.  Minimal edema to BLE, equal.   Neurological: She is alert and oriented to person, place, and time.  Skin: Skin is warm and dry.  Psychiatric: She has a normal mood and affect. Her behavior is normal.   Nursing note and vitals reviewed.   ED Course  Procedures (including critical care time)  DIAGNOSTIC STUDIES: Oxygen Saturation is 94% on RA, adequate by my interpretation.    COORDINATION OF CARE: 11:05 AM - Discussed treatment plan with pt and son at bedside which includes diagnostic testing to r/o MI. Pt and son verbalized understanding and agreed to plan.   Labs Review Labs Reviewed  BASIC METABOLIC PANEL - Abnormal; Notable for the following:    Sodium 134 (*)    Glucose, Bld 124 (*)    All other components within normal limits  CBC  TROPONIN I  D-DIMER, QUANTITATIVE (NOT AT [facility])  TROPONIN I  Randolm Idol, ED    Imaging Review Dg Chest 2 View  03/25/2015  CLINICAL DATA:  Chest pain with productive cough EXAM: CHEST  2 VIEW COMPARISON:  February 22, 2014 FINDINGS: There is no edema or consolidation. There is slight scarring in the left base. Heart size and pulmonary vascularity are  normal. No adenopathy. No bone lesions. IMPRESSION: Slight scarring left base.  No edema or consolidation. Electronically Signed   By: Lowella Grip III M.D.   On: 03/25/2015 11:57   I have personally reviewed and evaluated these images and lab results as part of my medical decision-making.   EKG Interpretation   Date/Time:  Thursday March 25 2015 10:43:13 EST Ventricular Rate:  80 PR Interval:  233 QRS Duration: 97 QT Interval:  386 QTC Calculation: 445 R Axis:   66 Text Interpretation:  Sinus rhythm Prolonged PR interval Low voltage,  precordial leads Baseline wander in lead(s) V6 No significant change since  last tracing Confirmed by [facility] MD, Corene Cornea [id number]) on 03/25/2015 10:46:59  AM      MDM   Final diagnoses:  Chest pain, unspecified chest pain type   Atypical chest pain. D dimer negative. No CP here. Doubt ACS, PE, dissection at this time. Low HEART score, delta troponins negative. Ok for pcp follow up.   I personally performed the services described in this  documentation, which was scribed in my presence. The recorded information has been reviewed and is accurate.       Merrily Pew, MD 03/25/15 1434

## 2015-03-25 NOTE — ED Notes (Signed)
Pt reports intermittent generalized chest tightness over the past month. Pt states the tightness worsened today and she feels SOB. Pt hx of COPD. Pt also noted to have a congested cough. Pt also states, "I feel like my heart is skipping beats."

## 2015-04-13 ENCOUNTER — Ambulatory Visit: Payer: Medicare Other | Admitting: Gastroenterology

## 2015-04-26 DIAGNOSIS — H43811 Vitreous degeneration, right eye: Secondary | ICD-10-CM | POA: Diagnosis not present

## 2015-04-28 ENCOUNTER — Encounter: Payer: Self-pay | Admitting: Gastroenterology

## 2015-04-28 ENCOUNTER — Ambulatory Visit (INDEPENDENT_AMBULATORY_CARE_PROVIDER_SITE_OTHER): Payer: Medicare Other | Admitting: Gastroenterology

## 2015-04-28 ENCOUNTER — Other Ambulatory Visit: Payer: Self-pay

## 2015-04-28 VITALS — BP 153/77 | HR 78 | Temp 97.0°F | Ht 64.0 in | Wt 160.0 lb

## 2015-04-28 DIAGNOSIS — K2100 Gastro-esophageal reflux disease with esophagitis, without bleeding: Secondary | ICD-10-CM

## 2015-04-28 DIAGNOSIS — D509 Iron deficiency anemia, unspecified: Secondary | ICD-10-CM | POA: Diagnosis not present

## 2015-04-28 DIAGNOSIS — K5520 Angiodysplasia of colon without hemorrhage: Secondary | ICD-10-CM

## 2015-04-28 DIAGNOSIS — Q2733 Arteriovenous malformation of digestive system vessel: Secondary | ICD-10-CM | POA: Diagnosis not present

## 2015-04-28 NOTE — Progress Notes (Signed)
cc'ed to pcp °

## 2015-04-28 NOTE — Assessment & Plan Note (Signed)
Doing well on current regimen of pantoprazole 40 mg daily. Discussed antireflux measures. Return to the office in one year.

## 2015-04-28 NOTE — Progress Notes (Signed)
Primary Care Physician: Alonza Bogus, MD  Primary Gastroenterologist:  Garfield Cornea, MD   Chief Complaint  Patient presents with  . Follow-up    HPI: Leah Dalton is a 70 y.o. female here for follow up. 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 Previously followed by Dr. Tressie Stalker from 2009 up until she moved to [place]. Has not been seen by hematology since prior to 2014 when she moved back. Seen in hospital in 01/2014 with anemia/heme + stools. Hgb 6.5, MCV 72. EGD and colonoscopy. She had ulcerative/erosive reflux esophagitis and small hiatal hernia. She is placed on PPI twice a day. 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 Terminal ileum up to 5 cm was normal. 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 Noted that if she had recurrent anemia or bleeding she would need a capsule endoscopy.  Patient doing well from a GI standpoint. She is down to protonic once daily. Denies any heartburn, dysphagia, abdominal pain. Bowel movements regular. May get loose stools with salads. No melena rectal bleeding. In the emergency department in December with chest pain. Iron every other day. Hemoglobin at that time was 14.1. Troponins were negative. Chest x-ray with slight scarring in the left base.  After her ER visit and she noticed her hemoglobin was normal, she reduced her iron to every other day.  Current Outpatient Prescriptions  Medication Sig Dispense Refill  . albuterol (PROVENTIL HFA;VENTOLIN HFA) 108 (90 BASE) MCG/ACT inhaler Inhale 2 puffs into the lungs every 6 (six) hours as needed for wheezing.    Marland Kitchen albuterol (PROVENTIL) (2.5 MG/3ML) 0.083% nebulizer solution Take 2.5 mg by nebulization 3 (three) times daily.    Marland Kitchen aspirin EC 81 MG tablet Take 81 mg by mouth daily.     .  calcium carbonate (OS-CAL) 600 MG TABS Take 600 mg by mouth 2 (two) times daily with a meal.    . Cholecalciferol (VITAMIN D-3) 5000 UNITS TABS Take 1 tablet by mouth daily.    Marland Kitchen dextromethorphan-guaiFENesin (MUCINEX DM) 30-600 MG per 12 hr tablet Take 1 tablet by mouth daily.     . ferrous sulfate (FERROUSUL) 325 (65 FE) MG tablet Take 1 tablet (325 mg total) by mouth daily with breakfast. 30 tablet 3  . fluticasone (FLONASE) 50 MCG/ACT nasal spray Place 2 sprays into the nose daily. 1 g 0  . Fluticasone-Salmeterol (ADVAIR) 250-50 MCG/DOSE AEPB Inhale 1 puff into the lungs every 12 (twelve) hours.    . furosemide (LASIX) 40 MG tablet Take 40 mg by mouth daily as needed for fluid.     Marland Kitchen ipratropium (ATROVENT) 0.02 % nebulizer solution Take 500 mcg by nebulization 3 (three) times daily.    . pantoprazole (PROTONIX) 40 MG tablet Take 1 tablet (40 mg total) by mouth 2 (two) times daily. (Patient taking differently: Take 40 mg by mouth daily. ) 60 tablet 5   No current facility-administered medications for this visit.    Allergies as of 04/28/2015 - Review Complete 04/28/2015  Allergen Reaction Noted  . Codeine Itching 06/07/2012  . Daliresp [roflumilast] Diarrhea 06/07/2012  . Keflex [cephalexin] Other (See Comments) 06/07/2012  . Penicillins Itching 06/07/2012    ROS:  General: Negative for anorexia, weight loss, fever, chills, fatigue, weakness. ENT: Negative for hoarseness, difficulty swallowing , nasal congestion.  CV: Negative for chest pain, angina, palpitations, dyspnea on exertion, peripheral edema.  Respiratory: Negative for dyspnea at rest, dyspnea on exertion, cough, sputum, wheezing.  GI: See history of present illness. GU:  Negative for dysuria, hematuria, urinary incontinence, urinary frequency, nocturnal urination.  Endo: Negative for unusual weight change.    Physical Examination:   BP 153/77 mmHg  Pulse 78  Temp(Src) 97 F (36.1 C)  Ht 5\' 4"  (1.626 m)  Wt 160 lb  (72.576 kg)  BMI 27.45 kg/m2  General: Well-nourished, well-developed in no acute distress.  Eyes: No icterus. Mouth: Oropharyngeal mucosa moist and pink , no lesions erythema or exudate. Lungs: Clear to auscultation bilaterally.  Heart: Regular rate and rhythm, no murmurs rubs or gallops.  Abdomen: Bowel sounds are normal, nontender, nondistended, no hepatosplenomegaly or masses, no abdominal bruits, no rebound or guarding.  Small umbilical hernia easily reducible nontender. Midline ventral hernia, moderate in size, easily reducible. Extremities: No lower extremity edema. No clubbing or deformities. Neuro: Alert and oriented x 4   Skin: Warm and dry, no jaundice.   Psych: Alert and cooperative, normal mood and affect.  Labs:  Lab Results  Component Value Date   WBC 7.4 03/25/2015   HGB 14.1 03/25/2015   HCT 43.3 03/25/2015   MCV 93.5 03/25/2015   PLT 343 03/25/2015    Lab Results  Component Value Date   CREATININE 0.49 03/25/2015   BUN 6 03/25/2015   NA 134* 03/25/2015   K 4.1 03/25/2015   CL 101 03/25/2015   CO2 28 03/25/2015     Imaging Studies: No results found.

## 2015-04-28 NOTE — Assessment & Plan Note (Signed)
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 Require blood transfusion at that time. Currently she is on iron every other day. Her hemoglobin is normal. We need to reevaluate her ferritin level to make sure she is not developing iron overload. She would like to wait until her next blood check to have this done. We'll plan on a CBC and ferritin in March 2017. Return to the office in one year or sooner if needed.

## 2015-04-28 NOTE — Patient Instructions (Signed)
1. CBC, ferritin in 05/2015. 2. Continue iron every other day for now. 3. Continue pantoprazole once daily. 4. Return to the office in one year or sooner if needed.

## 2015-04-29 DIAGNOSIS — J449 Chronic obstructive pulmonary disease, unspecified: Secondary | ICD-10-CM | POA: Diagnosis not present

## 2015-05-10 ENCOUNTER — Other Ambulatory Visit: Payer: Self-pay

## 2015-05-10 DIAGNOSIS — D509 Iron deficiency anemia, unspecified: Secondary | ICD-10-CM

## 2015-05-26 ENCOUNTER — Other Ambulatory Visit: Payer: Self-pay | Admitting: Gastroenterology

## 2015-05-26 DIAGNOSIS — D509 Iron deficiency anemia, unspecified: Secondary | ICD-10-CM | POA: Diagnosis not present

## 2015-05-26 LAB — CBC WITH DIFFERENTIAL/PLATELET
Basophils Absolute: 0.1 K/uL (ref 0.0–0.1)
Basophils Relative: 1 % (ref 0–1)
Eosinophils Absolute: 0.3 K/uL (ref 0.0–0.7)
Eosinophils Relative: 4 % (ref 0–5)
HCT: 42 % (ref 36.0–46.0)
Hemoglobin: 14.3 g/dL (ref 12.0–15.0)
Lymphocytes Relative: 18 % (ref 12–46)
Lymphs Abs: 1.3 K/uL (ref 0.7–4.0)
MCH: 31.8 pg (ref 26.0–34.0)
MCHC: 34 g/dL (ref 30.0–36.0)
MCV: 93.5 fL (ref 78.0–100.0)
MPV: 9.2 fL (ref 8.6–12.4)
Monocytes Absolute: 0.6 K/uL (ref 0.1–1.0)
Monocytes Relative: 9 % (ref 3–12)
Neutro Abs: 4.9 K/uL (ref 1.7–7.7)
Neutrophils Relative %: 68 % (ref 43–77)
Platelets: 365 K/uL (ref 150–400)
RBC: 4.49 MIL/uL (ref 3.87–5.11)
RDW: 14.4 % (ref 11.5–15.5)
WBC: 7.2 K/uL (ref 4.0–10.5)

## 2015-05-27 LAB — FERRITIN: Ferritin: 50 ng/mL (ref 20–288)

## 2015-05-31 NOTE — Progress Notes (Signed)
Quick Note:  Hgb and ferritin stable. No evidence of iron overload. Stop iron.  Repeat CBC, ferritin in 3 months. ______

## 2015-06-03 ENCOUNTER — Other Ambulatory Visit: Payer: Self-pay | Admitting: Gastroenterology

## 2015-06-03 DIAGNOSIS — D509 Iron deficiency anemia, unspecified: Secondary | ICD-10-CM

## 2015-06-09 DIAGNOSIS — J449 Chronic obstructive pulmonary disease, unspecified: Secondary | ICD-10-CM | POA: Diagnosis not present

## 2015-06-09 DIAGNOSIS — G629 Polyneuropathy, unspecified: Secondary | ICD-10-CM | POA: Diagnosis not present

## 2015-06-09 DIAGNOSIS — D649 Anemia, unspecified: Secondary | ICD-10-CM | POA: Diagnosis not present

## 2015-07-08 DIAGNOSIS — D225 Melanocytic nevi of trunk: Secondary | ICD-10-CM | POA: Diagnosis not present

## 2015-07-08 DIAGNOSIS — D485 Neoplasm of uncertain behavior of skin: Secondary | ICD-10-CM | POA: Diagnosis not present

## 2015-07-08 DIAGNOSIS — L820 Inflamed seborrheic keratosis: Secondary | ICD-10-CM | POA: Diagnosis not present

## 2015-07-08 DIAGNOSIS — L210 Seborrhea capitis: Secondary | ICD-10-CM | POA: Diagnosis not present

## 2015-08-16 ENCOUNTER — Other Ambulatory Visit: Payer: Self-pay

## 2015-08-16 DIAGNOSIS — D509 Iron deficiency anemia, unspecified: Secondary | ICD-10-CM

## 2015-09-15 DIAGNOSIS — D509 Iron deficiency anemia, unspecified: Secondary | ICD-10-CM | POA: Diagnosis not present

## 2015-09-15 DIAGNOSIS — D485 Neoplasm of uncertain behavior of skin: Secondary | ICD-10-CM | POA: Diagnosis not present

## 2015-09-15 DIAGNOSIS — L820 Inflamed seborrheic keratosis: Secondary | ICD-10-CM | POA: Diagnosis not present

## 2015-09-15 DIAGNOSIS — L219 Seborrheic dermatitis, unspecified: Secondary | ICD-10-CM | POA: Diagnosis not present

## 2015-09-15 DIAGNOSIS — Z1283 Encounter for screening for malignant neoplasm of skin: Secondary | ICD-10-CM | POA: Diagnosis not present

## 2015-09-15 DIAGNOSIS — D2261 Melanocytic nevi of right upper limb, including shoulder: Secondary | ICD-10-CM | POA: Diagnosis not present

## 2015-09-16 LAB — CBC WITH DIFFERENTIAL/PLATELET
BASOS ABS: 76 {cells}/uL (ref 0–200)
BASOS PCT: 1 %
EOS PCT: 3 %
Eosinophils Absolute: 228 cells/uL (ref 15–500)
HCT: 42.7 % (ref 35.0–45.0)
Hemoglobin: 14 g/dL (ref 11.7–15.5)
LYMPHS PCT: 16 %
Lymphs Abs: 1216 cells/uL (ref 850–3900)
MCH: 31.3 pg (ref 27.0–33.0)
MCHC: 32.8 g/dL (ref 32.0–36.0)
MCV: 95.3 fL (ref 80.0–100.0)
MONOS PCT: 8 %
MPV: 9.3 fL (ref 7.5–12.5)
Monocytes Absolute: 608 cells/uL (ref 200–950)
NEUTROS ABS: 5472 {cells}/uL (ref 1500–7800)
Neutrophils Relative %: 72 %
PLATELETS: 374 10*3/uL (ref 140–400)
RBC: 4.48 MIL/uL (ref 3.80–5.10)
RDW: 13.9 % (ref 11.0–15.0)
WBC: 7.6 10*3/uL (ref 3.8–10.8)

## 2015-09-16 LAB — FERRITIN: FERRITIN: 18 ng/mL — AB (ref 20–288)

## 2015-09-21 ENCOUNTER — Encounter: Payer: Self-pay | Admitting: Gastroenterology

## 2015-09-21 NOTE — Progress Notes (Signed)
Quick Note:  Ferritin has dropped some. H/h stable. Is she taking any iron at this time? Repeat CBC, ferritin in 3 months. ______

## 2015-09-22 ENCOUNTER — Other Ambulatory Visit: Payer: Self-pay | Admitting: Gastroenterology

## 2015-09-22 DIAGNOSIS — D509 Iron deficiency anemia, unspecified: Secondary | ICD-10-CM

## 2015-10-13 DIAGNOSIS — K209 Esophagitis, unspecified: Secondary | ICD-10-CM | POA: Diagnosis not present

## 2015-10-13 DIAGNOSIS — J9611 Chronic respiratory failure with hypoxia: Secondary | ICD-10-CM | POA: Diagnosis not present

## 2015-10-13 DIAGNOSIS — J449 Chronic obstructive pulmonary disease, unspecified: Secondary | ICD-10-CM | POA: Diagnosis not present

## 2015-10-13 NOTE — Progress Notes (Signed)
Quick Note:  Let's hold off on iron right now given her h/o hemochromatosis. Repeat CBC, ferritin in 11/2015 as planned. ______

## 2015-10-15 ENCOUNTER — Other Ambulatory Visit (HOSPITAL_COMMUNITY): Payer: Self-pay | Admitting: Pulmonary Disease

## 2015-10-15 DIAGNOSIS — Z1231 Encounter for screening mammogram for malignant neoplasm of breast: Secondary | ICD-10-CM

## 2015-11-10 ENCOUNTER — Ambulatory Visit (HOSPITAL_COMMUNITY)
Admission: RE | Admit: 2015-11-10 | Discharge: 2015-11-10 | Disposition: A | Discharge status: Home / Self Care | Payer: Medicare Other | Source: Ambulatory Visit | Attending: Pulmonary Disease | Admitting: Pulmonary Disease

## 2015-11-10 DIAGNOSIS — Z1231 Encounter for screening mammogram for malignant neoplasm of breast: Secondary | ICD-10-CM | POA: Insufficient documentation

## 2015-11-19 ENCOUNTER — Other Ambulatory Visit: Payer: Self-pay

## 2015-11-22 ENCOUNTER — Other Ambulatory Visit: Payer: Self-pay

## 2015-11-22 DIAGNOSIS — D509 Iron deficiency anemia, unspecified: Secondary | ICD-10-CM

## 2015-12-01 DIAGNOSIS — D509 Iron deficiency anemia, unspecified: Secondary | ICD-10-CM | POA: Diagnosis not present

## 2015-12-01 LAB — CBC WITH DIFFERENTIAL/PLATELET
BASOS PCT: 0 %
Basophils Absolute: 0 cells/uL (ref 0–200)
EOS PCT: 3 %
Eosinophils Absolute: 258 cells/uL (ref 15–500)
HCT: 42.3 % (ref 35.0–45.0)
HEMOGLOBIN: 14.1 g/dL (ref 11.7–15.5)
LYMPHS ABS: 1462 {cells}/uL (ref 850–3900)
Lymphocytes Relative: 17 %
MCH: 31.5 pg (ref 27.0–33.0)
MCHC: 33.3 g/dL (ref 32.0–36.0)
MCV: 94.6 fL (ref 80.0–100.0)
MPV: 9.3 fL (ref 7.5–12.5)
Monocytes Absolute: 860 cells/uL (ref 200–950)
Monocytes Relative: 10 %
NEUTROS ABS: 6020 {cells}/uL (ref 1500–7800)
Neutrophils Relative %: 70 %
PLATELETS: 363 10*3/uL (ref 140–400)
RBC: 4.47 MIL/uL (ref 3.80–5.10)
RDW: 13.7 % (ref 11.0–15.0)
WBC: 8.6 10*3/uL (ref 3.8–10.8)

## 2015-12-02 ENCOUNTER — Other Ambulatory Visit: Payer: Self-pay

## 2015-12-02 DIAGNOSIS — D509 Iron deficiency anemia, unspecified: Secondary | ICD-10-CM

## 2015-12-02 LAB — FERRITIN: FERRITIN: 25 ng/mL (ref 20–288)

## 2015-12-02 NOTE — Progress Notes (Signed)
Please let patient know that her hemoglobin is normal. Her ferritin is low normal, which is good in the setting of hereditary hemochromatosis. Do not take iron supplements.  Recheck CBC, ferritin in 4 months.

## 2015-12-02 NOTE — Progress Notes (Signed)
Pt is aware and lab orders on file for 4 months.

## 2015-12-10 DIAGNOSIS — Z23 Encounter for immunization: Secondary | ICD-10-CM | POA: Diagnosis not present

## 2016-01-19 DIAGNOSIS — J209 Acute bronchitis, unspecified: Secondary | ICD-10-CM | POA: Diagnosis not present

## 2016-01-19 DIAGNOSIS — J440 Chronic obstructive pulmonary disease with acute lower respiratory infection: Secondary | ICD-10-CM | POA: Diagnosis not present

## 2016-01-26 DIAGNOSIS — J449 Chronic obstructive pulmonary disease, unspecified: Secondary | ICD-10-CM | POA: Diagnosis not present

## 2016-01-26 DIAGNOSIS — J209 Acute bronchitis, unspecified: Secondary | ICD-10-CM | POA: Diagnosis not present

## 2016-02-01 DIAGNOSIS — J9611 Chronic respiratory failure with hypoxia: Secondary | ICD-10-CM | POA: Diagnosis not present

## 2016-02-01 DIAGNOSIS — J441 Chronic obstructive pulmonary disease with (acute) exacerbation: Secondary | ICD-10-CM | POA: Diagnosis not present

## 2016-02-01 DIAGNOSIS — G4730 Sleep apnea, unspecified: Secondary | ICD-10-CM | POA: Diagnosis not present

## 2016-02-20 ENCOUNTER — Encounter (HOSPITAL_COMMUNITY): Payer: Self-pay | Admitting: Emergency Medicine

## 2016-02-20 ENCOUNTER — Emergency Department (HOSPITAL_COMMUNITY): Payer: Medicare Other

## 2016-02-20 ENCOUNTER — Inpatient Hospital Stay (HOSPITAL_COMMUNITY)
Admission: EM | Admit: 2016-02-20 | Discharge: 2016-02-26 | DRG: 190 | Disposition: A | Discharge status: Home Health | Payer: Medicare Other | Attending: Pulmonary Disease | Admitting: Pulmonary Disease

## 2016-02-20 DIAGNOSIS — J449 Chronic obstructive pulmonary disease, unspecified: Secondary | ICD-10-CM | POA: Diagnosis present

## 2016-02-20 DIAGNOSIS — R000 Tachycardia, unspecified: Secondary | ICD-10-CM | POA: Diagnosis present

## 2016-02-20 DIAGNOSIS — Z888 Allergy status to other drugs, medicaments and biological substances status: Secondary | ICD-10-CM

## 2016-02-20 DIAGNOSIS — Z880 Allergy status to penicillin: Secondary | ICD-10-CM

## 2016-02-20 DIAGNOSIS — J441 Chronic obstructive pulmonary disease with (acute) exacerbation: Secondary | ICD-10-CM | POA: Diagnosis not present

## 2016-02-20 DIAGNOSIS — Z9981 Dependence on supplemental oxygen: Secondary | ICD-10-CM | POA: Diagnosis not present

## 2016-02-20 DIAGNOSIS — Z79899 Other long term (current) drug therapy: Secondary | ICD-10-CM

## 2016-02-20 DIAGNOSIS — R0602 Shortness of breath: Secondary | ICD-10-CM | POA: Diagnosis not present

## 2016-02-20 DIAGNOSIS — Z881 Allergy status to other antibiotic agents status: Secondary | ICD-10-CM

## 2016-02-20 DIAGNOSIS — K2100 Gastro-esophageal reflux disease with esophagitis, without bleeding: Secondary | ICD-10-CM | POA: Diagnosis present

## 2016-02-20 DIAGNOSIS — J9621 Acute and chronic respiratory failure with hypoxia: Secondary | ICD-10-CM | POA: Diagnosis not present

## 2016-02-20 DIAGNOSIS — M6281 Muscle weakness (generalized): Secondary | ICD-10-CM

## 2016-02-20 DIAGNOSIS — E876 Hypokalemia: Secondary | ICD-10-CM | POA: Diagnosis present

## 2016-02-20 DIAGNOSIS — D751 Secondary polycythemia: Secondary | ICD-10-CM | POA: Diagnosis present

## 2016-02-20 DIAGNOSIS — Z87891 Personal history of nicotine dependence: Secondary | ICD-10-CM

## 2016-02-20 DIAGNOSIS — M810 Age-related osteoporosis without current pathological fracture: Secondary | ICD-10-CM | POA: Diagnosis present

## 2016-02-20 DIAGNOSIS — E871 Hypo-osmolality and hyponatremia: Secondary | ICD-10-CM | POA: Diagnosis not present

## 2016-02-20 DIAGNOSIS — Z7982 Long term (current) use of aspirin: Secondary | ICD-10-CM

## 2016-02-20 DIAGNOSIS — J9620 Acute and chronic respiratory failure, unspecified whether with hypoxia or hypercapnia: Secondary | ICD-10-CM | POA: Diagnosis present

## 2016-02-20 DIAGNOSIS — Z885 Allergy status to narcotic agent status: Secondary | ICD-10-CM

## 2016-02-20 DIAGNOSIS — Z7951 Long term (current) use of inhaled steroids: Secondary | ICD-10-CM

## 2016-02-20 HISTORY — DX: Esophagitis, unspecified: K20.9

## 2016-02-20 HISTORY — DX: Gastro-esophageal reflux disease without esophagitis: K21.9

## 2016-02-20 HISTORY — DX: Esophagitis, unspecified without bleeding: K20.90

## 2016-02-20 HISTORY — DX: Angiodysplasia of colon without hemorrhage: K55.20

## 2016-02-20 HISTORY — DX: Diverticulosis of large intestine without perforation or abscess without bleeding: K57.30

## 2016-02-20 LAB — COMPREHENSIVE METABOLIC PANEL
ALBUMIN: 3.4 g/dL — AB (ref 3.5–5.0)
ALT: 11 U/L — AB (ref 14–54)
AST: 14 U/L — AB (ref 15–41)
Alkaline Phosphatase: 82 U/L (ref 38–126)
Anion gap: 11 (ref 5–15)
BUN: 7 mg/dL (ref 6–20)
CHLORIDE: 89 mmol/L — AB (ref 101–111)
CO2: 32 mmol/L (ref 22–32)
CREATININE: 0.48 mg/dL (ref 0.44–1.00)
Calcium: 9.2 mg/dL (ref 8.9–10.3)
GFR calc Af Amer: >60 mL/min (ref >60)
GFR calc non Af Amer: >60 mL/min (ref >60)
GLUCOSE: 131 mg/dL — AB (ref 65–99)
POTASSIUM: 2.9 mmol/L — AB (ref 3.5–5.1)
Sodium: 132 mmol/L — ABNORMAL LOW (ref 135–145)
Total Bilirubin: 0.7 mg/dL (ref 0.3–1.2)
Total Protein: 6.9 g/dL (ref 6.5–8.1)

## 2016-02-20 LAB — CBC WITH DIFFERENTIAL/PLATELET
BASOS PCT: 0 %
Basophils Absolute: 0 10*3/uL (ref 0.0–0.1)
EOS ABS: 0.3 10*3/uL (ref 0.0–0.7)
EOS PCT: 4 %
HCT: 39 % (ref 36.0–46.0)
Hemoglobin: 13.2 g/dL (ref 12.0–15.0)
LYMPHS ABS: 0.8 10*3/uL (ref 0.7–4.0)
Lymphocytes Relative: 9 %
MCH: 31.5 pg (ref 26.0–34.0)
MCHC: 33.8 g/dL (ref 30.0–36.0)
MCV: 93.1 fL (ref 78.0–100.0)
MONO ABS: 0.9 10*3/uL (ref 0.1–1.0)
MONOS PCT: 10 %
NEUTROS PCT: 77 %
Neutro Abs: 6.6 10*3/uL (ref 1.7–7.7)
PLATELETS: 284 10*3/uL (ref 150–400)
RBC: 4.19 MIL/uL (ref 3.87–5.11)
RDW: 13 % (ref 11.5–15.5)
WBC: 8.5 10*3/uL (ref 4.0–10.5)

## 2016-02-20 LAB — BRAIN NATRIURETIC PEPTIDE: B NATRIURETIC PEPTIDE 5: 24 pg/mL (ref 0.0–100.0)

## 2016-02-20 LAB — TROPONIN I: Troponin I: <0.03 ng/mL (ref <0.03)

## 2016-02-20 MED ORDER — HEPARIN SODIUM (PORCINE) 5000 UNIT/ML IJ SOLN
5000.0000 [IU] | Freq: Three times a day (TID) | INTRAMUSCULAR | Status: DC
  Administered 2016-02-20 – 2016-02-26 (×17): 5000 [IU] via SUBCUTANEOUS
  Filled 2016-02-20 (×17): qty 1

## 2016-02-20 MED ORDER — VITAMIN D 1000 UNITS PO TABS
5000.0000 [IU] | ORAL_TABLET | Freq: Every day | ORAL | Status: DC
  Administered 2016-02-20 – 2016-02-26 (×6): 5000 [IU] via ORAL
  Filled 2016-02-20 (×5): qty 5

## 2016-02-20 MED ORDER — METHYLPREDNISOLONE SODIUM SUCC 125 MG IJ SOLR
125.0000 mg | Freq: Once | INTRAMUSCULAR | Status: AC
  Administered 2016-02-20: 125 mg via INTRAVENOUS
  Filled 2016-02-20: qty 2

## 2016-02-20 MED ORDER — CALCIUM CARBONATE 1250 (500 CA) MG PO TABS
1250.0000 mg | ORAL_TABLET | Freq: Two times a day (BID) | ORAL | Status: DC
  Administered 2016-02-20: 1250 mg via ORAL
  Filled 2016-02-20 (×6): qty 1

## 2016-02-20 MED ORDER — LEVOFLOXACIN IN D5W 500 MG/100ML IV SOLN
500.0000 mg | INTRAVENOUS | Status: DC
  Administered 2016-02-20 – 2016-02-24 (×5): 500 mg via INTRAVENOUS
  Filled 2016-02-20 (×5): qty 100

## 2016-02-20 MED ORDER — ASPIRIN EC 81 MG PO TBEC
81.0000 mg | DELAYED_RELEASE_TABLET | Freq: Every day | ORAL | Status: DC
  Administered 2016-02-20 – 2016-02-26 (×7): 81 mg via ORAL
  Filled 2016-02-20 (×7): qty 1

## 2016-02-20 MED ORDER — LEVALBUTEROL HCL 0.63 MG/3ML IN NEBU
0.6300 mg | INHALATION_SOLUTION | Freq: Four times a day (QID) | RESPIRATORY_TRACT | Status: DC
  Administered 2016-02-20 – 2016-02-23 (×11): 0.63 mg via RESPIRATORY_TRACT
  Filled 2016-02-20 (×11): qty 3

## 2016-02-20 MED ORDER — CALCIUM CARBONATE 1250 (500 CA) MG PO TABS: 600.0000 mg | ORAL_TABLET | Freq: Two times a day (BID) | ORAL | Status: DC

## 2016-02-20 MED ORDER — IPRATROPIUM BROMIDE 0.02 % IN SOLN
1.0000 mg | Freq: Once | RESPIRATORY_TRACT | Status: AC
  Administered 2016-02-20: 1 mg via RESPIRATORY_TRACT
  Filled 2016-02-20: qty 5

## 2016-02-20 MED ORDER — DM-GUAIFENESIN ER 30-600 MG PO TB12
1.0000 | ORAL_TABLET | Freq: Every day | ORAL | Status: DC
  Administered 2016-02-20 – 2016-02-26 (×7): 1 via ORAL
  Filled 2016-02-20 (×7): qty 1

## 2016-02-20 MED ORDER — FUROSEMIDE 40 MG PO TABS
40.0000 mg | ORAL_TABLET | Freq: Every day | ORAL | Status: DC | PRN
Start: 2016-02-20 — End: 2016-02-26
  Administered 2016-02-22 – 2016-02-25 (×2): 40 mg via ORAL
  Filled 2016-02-20 (×2): qty 1

## 2016-02-20 MED ORDER — POTASSIUM CHLORIDE CRYS ER 20 MEQ PO TBCR
40.0000 meq | EXTENDED_RELEASE_TABLET | Freq: Once | ORAL | Status: AC
  Administered 2016-02-20: 40 meq via ORAL
  Filled 2016-02-20: qty 2

## 2016-02-20 MED ORDER — ALBUTEROL (5 MG/ML) CONTINUOUS INHALATION SOLN
10.0000 mg/h | INHALATION_SOLUTION | Freq: Once | RESPIRATORY_TRACT | Status: AC
  Administered 2016-02-20: 10 mg/h via RESPIRATORY_TRACT
  Filled 2016-02-20: qty 20

## 2016-02-20 MED ORDER — SODIUM CHLORIDE 0.9% FLUSH
3.0000 mL | Freq: Two times a day (BID) | INTRAVENOUS | Status: DC
  Administered 2016-02-20 – 2016-02-26 (×12): 3 mL via INTRAVENOUS

## 2016-02-20 MED ORDER — METHYLPREDNISOLONE SODIUM SUCC 40 MG IJ SOLR
40.0000 mg | Freq: Four times a day (QID) | INTRAMUSCULAR | Status: DC
Start: 2016-02-20 — End: 2016-02-23
  Administered 2016-02-20 – 2016-02-23 (×11): 40 mg via INTRAVENOUS
  Filled 2016-02-20 (×11): qty 1

## 2016-02-20 MED ORDER — PANTOPRAZOLE SODIUM 40 MG PO TBEC
40.0000 mg | DELAYED_RELEASE_TABLET | Freq: Two times a day (BID) | ORAL | Status: DC
  Administered 2016-02-20 – 2016-02-26 (×7): 40 mg via ORAL
  Filled 2016-02-20 (×11): qty 1

## 2016-02-20 NOTE — ED Notes (Signed)
Pt unable to ambulate due to generalized weakness and dyspnea at rest. Pt states she will attempt later.

## 2016-02-20 NOTE — ED Provider Notes (Signed)
[facility] DEPT Provider Note   CSN: [id number] Arrival date & time: 02/20/16  [id number]     History   Chief Complaint Chief Complaint  Patient presents with  . Shortness of Breath    HPI Leah Dalton is a 70 y.o. female.  HPI  Pt was seen at 0930.  Per pt, c/o gradual onset and worsening of persistent cough, wheezing and SOB for the past 1.5 months.  Describes her symptoms as "my COPD."  Has been using home O2, MDI and nebs without relief.  Pt states she has been rx multiple courses of antibiotics and prednisone for the past 1.5 months without change in her symptoms. Denies CP/palpitations, no back pain, no abd pain, no N/V/D, no fevers, no rash.    Past Medical History:  Diagnosis Date  . AVM (arteriovenous malformation) of colon   . COPD (chronic obstructive pulmonary disease) ([place])    O2 dependent, started 05/31/2012  . Diverticula of colon   . Esophagitis   . GERD (gastroesophageal reflux disease)   . 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 History of tobacco abuse   . Osteoporosis   . Polycythemia    Has had to have phlebotomies in the pas    Patient Active Problem List   Diagnosis Date Noted  . 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 AVM (arteriovenous malformation) of colon 04/10/2014  . IDA (iron deficiency anemia) 04/10/2014  . External hemorrhoids 02/24/2014  . Reflux esophagitis   . Acute blood loss anemia 02/22/2014  . Symptomatic anemia 02/22/2014  . COPD with exacerbation ([place]) 06/07/2012  . Acute-on-chronic respiratory failure ([place]) 06/07/2012  . Polycythemia 06/07/2012  . Hyponatremia 06/07/2012    Past Surgical History:  Procedure Laterality Date  . ABDOMINAL HYSTERECTOMY    . CATARACT EXTRACTION    . 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 Engorged internal hemorrhoids; colonic diverticulosis.  Marland Kitchen ESOPHAGOGASTRODUODENOSCOPY N/A 02/23/2014   RMR:  Ulcerative/erosive  esophagitis. small hiatal hernia    OB History    Gravida Para Term Preterm AB Living             1   SAB TAB Ectopic Multiple Live Births                   Home Medications    Prior to Admission medications   Medication Sig Start Date End Date Taking? Authorizing Provider  albuterol (PROVENTIL HFA;VENTOLIN HFA) 108 (90 BASE) MCG/ACT inhaler Inhale 2 puffs into the lungs every 6 (six) hours as needed for wheezing.   Yes Historical Provider, MD  albuterol (PROVENTIL) (2.5 MG/3ML) 0.083% nebulizer solution Take 2.5 mg by nebulization 3 (three) times daily.   Yes Historical Provider, MD  aspirin EC 81 MG tablet Take 81 mg by mouth daily.    Yes Historical Provider, MD  calcium carbonate (OS-CAL) 600 MG TABS Take 600 mg by mouth 2 (two) times daily with a meal.   Yes Historical Provider, MD  Cholecalciferol (VITAMIN D-3) 5000 UNITS TABS Take 1 tablet by mouth daily.   Yes Historical Provider, MD  dextromethorphan-guaiFENesin (MUCINEX DM) 30-600 MG per 12 hr tablet Take 1 tablet by mouth daily.    Yes Historical Provider, MD  Fluticasone-Salmeterol (ADVAIR) 250-50 MCG/DOSE AEPB Inhale 1 puff into the lungs every 12 (twelve) hours.   Yes Historical Provider, MD  furosemide (LASIX) 40 MG tablet Take 40 mg by mouth daily as needed for fluid.  Yes Historical Provider, MD  ipratropium (ATROVENT) 0.02 % nebulizer solution Take 500 mcg by nebulization 3 (three) times daily.   Yes Historical Provider, MD  pantoprazole (PROTONIX) 40 MG tablet Take 1 tablet (40 mg total) by mouth 2 (two) times daily. Patient taking differently: Take 40 mg by mouth daily.  02/24/14  Yes Sinda Du, MD    Family History History reviewed. No pertinent family history.  Social History Social History  Substance Use Topics  . Smoking status: Former Smoker    Quit date: 2012  . Smokeless tobacco: Never Used  . Alcohol use Yes     Comment: occassionally     Allergies   Codeine; Daliresp [roflumilast]; Keflex  [cephalexin]; and Penicillins   Review of Systems Review of Systems ROS: Statement: All systems negative except as marked or noted in the HPI; Constitutional: Negative for fever and chills. ; ; Eyes: Negative for eye pain, redness and discharge. ; ; ENMT: Negative for ear pain, hoarseness, nasal congestion, sinus pressure and sore throat. ; ; Cardiovascular: Negative for chest pain, palpitations, diaphoresis, and peripheral edema. ; ; Respiratory: +cough, wheezing, SOB. Negative for stridor. ; ; Gastrointestinal: Negative for nausea, vomiting, diarrhea, abdominal pain, blood in stool, hematemesis, jaundice and rectal bleeding. . ; ; Genitourinary: Negative for dysuria, flank pain and hematuria. ; ; Musculoskeletal: Negative for back pain and neck pain. Negative for swelling and trauma.; ; Skin: Negative for pruritus, rash, abrasions, blisters, bruising and skin lesion.; ; Neuro: Negative for headache, lightheadedness and neck stiffness. Negative for weakness, altered level of consciousness, altered mental status, extremity weakness, paresthesias, involuntary movement, seizure and syncope.       Physical Exam Updated Vital Signs BP 170/94   Pulse 117   Temp 98.3 F (36.8 C) (Oral)   Resp 22   Ht 5\' 4"  (1.626 m)   Wt 160 lb (72.6 kg)   SpO2 91%   BMI 27.46 kg/m    Patient Vitals for the past 24 hrs:  BP Temp Temp src Pulse Resp SpO2 Height Weight  02/20/16 1130 170/94 - - 117 22 91 % - -  02/20/16 1100 184/81 - - 105 20 95 % - -  02/20/16 1042 169/72 - - 95 20 99 % - -  02/20/16 1030 169/72 - - 93 16 99 % - -  02/20/16 1000 147/84 - - 89 15 99 % - -  02/20/16 0953 - - - - - 99 % - -  02/20/16 0940 - - - - - 97 % - -  02/20/16 0937 163/79 98.3 F (36.8 C) Oral 94 22 97 % 5\' 4"  (1.626 m) 160 lb (72.6 kg)     Physical Exam 0935: Physical examination:  Nursing notes reviewed; Vital signs and O2 SAT reviewed;  Constitutional: Well developed, Well nourished, Well hydrated,  Uncomfortable appearing.; Head:  Normocephalic, atraumatic; Eyes: EOMI, PERRL, No scleral icterus; ENMT: Mouth and pharynx normal, Mucous membranes moist; Neck: Supple, Full range of motion, No lymphadenopathy; Cardiovascular: Tachycardic rate and rhythm, No gallop; Respiratory: Breath sounds diminished & equal bilaterally, insp/exp wheezes bilat. No audible wheezing. Speaking short sentences, Sitting upright, tachypneic.; Chest: Nontender, Movement normal; Abdomen: Soft, Nontender, Nondistended, Normal bowel sounds; Genitourinary: No CVA tenderness; Extremities: Pulses normal, No tenderness, No edema, No calf edema or asymmetry.; Neuro: AA&Ox3, Major CN grossly intact.  Speech clear. No gross focal motor deficits in extremities.; Skin: Color normal, Warm, Dry.   ED Treatments / Results  Labs (all labs ordered are listed,  but only abnormal results are displayed)   EKG  EKG Interpretation  Date/Time:  Sunday February 20 2016 09:37:35 EST Ventricular Rate:  95 PR Interval:    QRS Duration: 93 QT Interval:  354 QTC Calculation: 445 R Axis:   68 Text Interpretation:  Sinus rhythm Prolonged PR interval Consider right atrial enlargement Baseline wander When compared with ECG of 03/25/2015 No significant change was found Confirmed by [facility]  MD, Nunzio Cory [phone]) on 02/20/2016 11:08:53 AM       Radiology   Procedures Procedures (including critical care time)  Medications Ordered in ED Medications  albuterol (PROVENTIL,VENTOLIN) solution continuous neb (10 mg/hr Nebulization Given 02/20/16 0954)  ipratropium (ATROVENT) nebulizer solution 1 mg (1 mg Nebulization Given 02/20/16 0953)  methylPREDNISolone sodium succinate (SOLU-MEDROL) 125 mg/2 mL injection 125 mg (125 mg Intravenous Given 02/20/16 0955)  potassium chloride SA (K-DUR,KLOR-CON) CR tablet 40 mEq (40 mEq Oral Given 02/20/16 1117)     Initial Impression / Assessment and Plan / ED Course  I have reviewed the triage vital signs  and the nursing notes.  Pertinent labs & imaging results that were available during my care of the patient were reviewed by me and considered in my medical decision making (see chart for details).  MDM Reviewed: previous chart, nursing note and vitals Reviewed previous: labs and ECG Interpretation: labs, ECG and x-ray Total time providing critical care: 30-74 minutes. This excludes time spent performing separately reportable procedures and services. Consults: admitting MD   CRITICAL CARE Performed by: Alfonzo Feller Total critical care time: 35 minutes Critical care time was exclusive of separately billable procedures and treating other patients. Critical care was necessary to treat or prevent imminent or life-threatening deterioration. Critical care was time spent personally by me on the following activities: development of treatment plan with patient and/or surrogate as well as nursing, discussions with consultants, evaluation of patient's response to treatment, examination of patient, obtaining history from patient or surrogate, ordering and performing treatments and interventions, ordering and review of laboratory studies, ordering and review of radiographic studies, pulse oximetry and re-evaluation of patient's condition.   Results for orders placed or performed during the hospital encounter of 02/20/16  Comprehensive metabolic panel  Result Value Ref Range   Sodium 132 (L) 135 - 145 mmol/L   Potassium 2.9 (L) 3.5 - 5.1 mmol/L   Chloride 89 (L) 101 - 111 mmol/L   CO2 32 22 - 32 mmol/L   Glucose, Bld 131 (H) 65 - 99 mg/dL   BUN 7 6 - 20 mg/dL   Creatinine, Ser 0.48 0.44 - 1.00 mg/dL   Calcium 9.2 8.9 - 10.3 mg/dL   Total Protein 6.9 6.5 - 8.1 g/dL   Albumin 3.4 (L) 3.5 - 5.0 g/dL   AST 14 (L) 15 - 41 U/L   ALT 11 (L) 14 - 54 U/L   Alkaline Phosphatase 82 38 - 126 U/L   Total Bilirubin 0.7 0.3 - 1.2 mg/dL   GFR calc non Af Amer >60 >60 mL/min   GFR calc Af Amer >60 >60  mL/min   Anion gap 11 5 - 15  Brain natriuretic peptide  Result Value Ref Range   B Natriuretic Peptide 24.0 0.0 - 100.0 pg/mL  Troponin I  Result Value Ref Range   Troponin I <0.03 <0.03 ng/mL  CBC with Differential  Result Value Ref Range   WBC 8.5 4.0 - 10.5 K/uL   RBC 4.19 3.87 - 5.11 MIL/uL   Hemoglobin 13.2 12.0 -  15.0 g/dL   HCT 39.0 36.0 - 46.0 %   MCV 93.1 78.0 - 100.0 fL   MCH 31.5 26.0 - 34.0 pg   MCHC 33.8 30.0 - 36.0 g/dL   RDW 13.0 11.5 - 15.5 %   Platelets 284 150 - 400 K/uL   Neutrophils Relative % 77 %   Neutro Abs 6.6 1.7 - 7.7 K/uL   Lymphocytes Relative 9 %   Lymphs Abs 0.8 0.7 - 4.0 K/uL   Monocytes Relative 10 %   Monocytes Absolute 0.9 0.1 - 1.0 K/uL   Eosinophils Relative 4 %   Eosinophils Absolute 0.3 0.0 - 0.7 K/uL   Basophils Relative 0 %   Basophils Absolute 0.0 0.0 - 0.1 K/uL   Dg Chest 2 View Result Date: 02/20/2016 CLINICAL DATA:  Shortness of breath starting last night, history of bronchitis EXAM: CHEST  2 VIEW COMPARISON:  03/25/2015 FINDINGS: Cardiomediastinal silhouette is stable. Hyperinflation again noted. No acute infiltrate or pleural effusion. No pulmonary edema. Osteopenia and mild degenerative changes thoracic spine. Stable minimal compression deformity lower thoracic spine. IMPRESSION: No active cardiopulmonary disease.  Hyperinflation again noted. Electronically Signed   By: Lahoma Crocker M.D.   On: 02/20/2016 10:08    1155:  On arrival: pt sitting upright, tachypneic, tachycardic, Sats 97 % on her usual O2 2L N/C, lungs diminished. IV solumedrol and hour long neb started. After neb: pt appears more comfortable at rest, less tachypneic, Sats 97-99 % on O2 2L N/C, lungs continue diminished. Pt moved from sitting on stretcher to standing next to stretcher in exam room: pt's O2 Sats dropped to 91 % on O2 2L N/C with pt c/o increasing SOB, with increasing HR and RR. Pt sat back onto stretcher with O2 Sats slowly increasing to 95 % on O2 2L N/C.  Dx and testing d/w pt and family.  Questions answered.  Verb understanding, agreeable to admit.  T/C to [facility]. Marin Comment, case discussed, including:  HPI, pertinent PM/SHx, VS/PE, dx testing, ED course and treatment:  Agreeable to admit, requests he will come to the ED for evaluation.     Final Clinical Impressions(s) / ED Diagnoses   Final diagnoses:  None    New Prescriptions New Prescriptions   No medications on file     Francine Graven, DO 02/22/16 1645

## 2016-02-20 NOTE — ED Notes (Signed)
Attempted to call report, RN unavailable.

## 2016-02-20 NOTE — ED Triage Notes (Signed)
PT states SOB with exertion and wheezes with productive thick sputum cough worsening x3 weeks with multiple antibiotics and steroids recently.

## 2016-02-20 NOTE — ED Notes (Signed)
Pt unable to finish taking potassium tablets. Pt began vomiting.

## 2016-02-20 NOTE — ED Notes (Signed)
Attempted ambulation. Pt HR increased to 129 and RR increased to 34. EDP aware.

## 2016-02-20 NOTE — H&P (Signed)
History and Physical    Leah Dalton [id number] DOB: [birth year] DOA: 02/20/2016  Referring MD/NP/PA: Francine Graven, DO PCP: Alonza Bogus, MD  Outpatient Specialists:   Gastroenterology: Daneil Dolin, MD  Patient coming from: Home  Chief Complaint: Shortness of breath  HPI: Leah Dalton is a 70 y.o. female with medical history significant of COPD and GERD presents to the ed with  Complaints of worsening cough, wheezing, and SOB for the past month.  She has been a patient of Dr Luan Pulling, and had been seen in the urgent care, received 6 days of prednisone and an antibiotic, but she didn't get better.  She saw Dr Luan Pulling, and was placed on Prednisone again, using her inhaler and her oxygen concentrator, but still having coughs and SOB.  She denied any chest pain, fever, chills or orthopnea.  Evaluation in the ER included a CXR with no definite infiltrate, but hyperinflated lungs, normal WBC, K of 2.9 mm/dL, and normal Cr.  Her EKG showed ST with non specific ST T changes.  She was given IV Steroids, nebs, and IV Levoquin.  Hospitalist was asked to admit her for COPD exacerbation with failed outpatient therapy.   ED Course: In the ED, she was noted to be tachypneic and tachycardic. Sodium 132, potassium 2.9, albumin 3.4, AST 14,, ALT 11, and glucose 131. CBC was unremarkable. EKG showed sinus rhythm. BNP and troponin wnl. She was started on IV solu-medrol, nebs, and was admitted for further evaluation.  Review of Systems: As per HPI otherwise 10 point review of systems negative.   Past Medical History:  Diagnosis Date  . AVM (arteriovenous malformation) of colon   . COPD (chronic obstructive pulmonary disease) ([place])    O2 dependent, started 05/31/2012  . Diverticula of colon   . Esophagitis   . GERD (gastroesophageal reflux disease)   . 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 History of tobacco abuse   . Osteoporosis   . Polycythemia    Has had to have  phlebotomies in the pas    Past Surgical History:  Procedure Laterality Date  . ABDOMINAL HYSTERECTOMY    . CATARACT EXTRACTION    . 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 Engorged internal hemorrhoids; colonic diverticulosis.  Marland Kitchen ESOPHAGOGASTRODUODENOSCOPY N/A 02/23/2014   RMR:  Ulcerative/erosive esophagitis. small hiatal hernia     reports that she quit smoking about 5 years ago. She has never used smokeless tobacco. She reports that she drinks alcohol. She reports that she does not use drugs.  Allergies  Allergen Reactions  . Codeine Itching  . Daliresp [Roflumilast] Diarrhea  . Keflex [Cephalexin] Other (See Comments)    Gives pt yeast infection  . Penicillins Itching    History reviewed. No pertinent family history. Unacceptable: Noncontributory, unremarkable, or negative. Acceptable: Family history reviewed and not pertinent (If you reviewed it)  Prior to Admission medications   Medication Sig Start Date End Date Taking? Authorizing Provider  albuterol (PROVENTIL HFA;VENTOLIN HFA) 108 (90 BASE) MCG/ACT inhaler Inhale 2 puffs into the lungs every 6 (six) hours as needed for wheezing.   Yes Historical Provider, MD  albuterol (PROVENTIL) (2.5 MG/3ML) 0.083% nebulizer solution Take 2.5 mg by nebulization 3 (three) times daily.   Yes Historical Provider, MD  aspirin EC 81 MG tablet Take 81 mg by mouth daily.    Yes Historical Provider, MD  calcium carbonate (OS-CAL) 600 MG TABS Take 600  mg by mouth 2 (two) times daily with a meal.   Yes Historical Provider, MD  Cholecalciferol (VITAMIN D-3) 5000 UNITS TABS Take 1 tablet by mouth daily.   Yes Historical Provider, MD  dextromethorphan-guaiFENesin (MUCINEX DM) 30-600 MG per 12 hr tablet Take 1 tablet by mouth daily.    Yes Historical Provider, MD  Fluticasone-Salmeterol (ADVAIR) 250-50 MCG/DOSE AEPB Inhale 1 puff into the lungs every 12 (twelve) hours.   Yes Historical  Provider, MD  furosemide (LASIX) 40 MG tablet Take 40 mg by mouth daily as needed for fluid.    Yes Historical Provider, MD  ipratropium (ATROVENT) 0.02 % nebulizer solution Take 500 mcg by nebulization 3 (three) times daily.   Yes Historical Provider, MD  pantoprazole (PROTONIX) 40 MG tablet Take 1 tablet (40 mg total) by mouth 2 (two) times daily. Patient taking differently: Take 40 mg by mouth daily.  02/24/14  Yes Sinda Du, MD    Physical Exam: Vitals:   02/20/16 1330 02/20/16 1400 02/20/16 1526 02/20/16 1530  BP: 145/76 142/68  (!) 184/74  Pulse: 100 96  98  Resp: 14 21  (!) 22  Temp:    97.5 F (36.4 C)  TempSrc:    Oral  SpO2: (!) 88% 93%  96%  Weight:   73 kg (160 lb 14.4 oz)   Height:   5\' 4"  (1.626 m)       Constitutional: NAD, calm, comfortable Vitals:   02/20/16 1330 02/20/16 1400 02/20/16 1526 02/20/16 1530  BP: 145/76 142/68  (!) 184/74  Pulse: 100 96  98  Resp: 14 21  (!) 22  Temp:    97.5 F (36.4 C)  TempSrc:    Oral  SpO2: (!) 88% 93%  96%  Weight:   73 kg (160 lb 14.4 oz)   Height:   5\' 4"  (1.626 m)    Eyes: PERRL, lids and conjunctivae normal ENMT: Mucous membranes are moist. Posterior pharynx clear of any exudate or lesions.Normal dentition.  Neck: normal, supple, no masses, no thyromegaly Respiratory: She has bilateral wheezing, no crackles. Normal respiratory effort. No accessory muscle use.  Cardiovascular: Regular rate and rhythm, no murmurs / rubs / gallops. No extremity edema. 2+ pedal pulses. No carotid bruits.  Abdomen: no tenderness, no masses palpated. No hepatosplenomegaly. Bowel sounds positive.  Musculoskeletal: no clubbing / cyanosis. No joint deformity upper and lower extremities. Good ROM, no contractures. Normal muscle tone.  Skin: no rashes, lesions, ulcers. No induration Neurologic: CN 2-12 grossly intact. Sensation intact, DTR normal. Strength 5/5 in all 4.  Psychiatric: Normal judgment and insight. Alert and oriented x 3.  Normal mood.    Labs on Admission: I have personally reviewed following labs and imaging studies  CBC:  Recent Labs Lab 02/20/16 1013  WBC 8.5  NEUTROABS 6.6  HGB 13.2  HCT 39.0  MCV 93.1  PLT [id number]   Basic Metabolic Panel:  Recent Labs Lab 02/20/16 1013  NA 132*  K 2.9*  CL 89*  CO2 32  GLUCOSE 131*  BUN 7  CREATININE 0.48  CALCIUM 9.2   GFR: Estimated Creatinine Clearance: 64 mL/min (by C-G formula based on SCr of 0.48 mg/dL). Liver Function Tests:  Recent Labs Lab 02/20/16 1013  AST 14*  ALT 11*  ALKPHOS 82  BILITOT 0.7  PROT 6.9  ALBUMIN 3.4*   Cardiac Enzymes:  Recent Labs Lab 02/20/16 1013  TROPONINI <0.03   Radiological Exams on Admission: Dg Chest 2 View  Result Date: 02/20/2016  CLINICAL DATA:  Shortness of breath starting last night, history of bronchitis EXAM: CHEST  2 VIEW COMPARISON:  03/25/2015 FINDINGS: Cardiomediastinal silhouette is stable. Hyperinflation again noted. No acute infiltrate or pleural effusion. No pulmonary edema. Osteopenia and mild degenerative changes thoracic spine. Stable minimal compression deformity lower thoracic spine. IMPRESSION: No active cardiopulmonary disease.  Hyperinflation again noted. Electronically Signed   By: Lahoma Crocker M.D.   On: 02/20/2016 10:08    EKG: Independently reviewed.   Assessment/Plan Principal Problem:   COPD with acute exacerbation (HCC) Active Problems:   Reflux esophagitis   COPD exacerbation (HCC)   Acute hypokalemia   1. COPD exacerbation. Patient has short of breath for the past month. She is currently on oxygen and nebs at home but they have not improved her breathing. Upon entering the ED, she was immediately started on solu-medrol and nebs with significant relief. CXR unremarkable. Will continue IV solu-medrol, nebs, and pulmonary hygiene. Will add IV levaquin to her regimen.  2. Hyponatremia. Currently low at 132. Recheck BMP in the a.m. 3. Hypokalemia. Potassium 2.9. Start  on potassium supplementation. Check magnesium. Recheck BMP in the a.m. 4. GERD. Continue Protonix.   DVT prophylaxis: Heparin Code Status: Full Family Communication: Daughter in laws. Disposition Plan: Discharge once improved Consults called: None Admission status: Admit to inpatient   Orvan Falconer, MD    FACP [facility] If 7PM-7AM, please contact night-coverage [email or web address] Password [facility]  02/20/2016, 6:25 PM   By signing my name below, I, Hilbert Odor, attest that this documentation has been prepared under the direction and in the presence of Orvan Falconer, MD. Electronically signed: Hilbert Odor, Scribe.  02/20/16,

## 2016-02-21 DIAGNOSIS — Z881 Allergy status to other antibiotic agents status: Secondary | ICD-10-CM | POA: Diagnosis not present

## 2016-02-21 DIAGNOSIS — Z7951 Long term (current) use of inhaled steroids: Secondary | ICD-10-CM | POA: Diagnosis not present

## 2016-02-21 DIAGNOSIS — M810 Age-related osteoporosis without current pathological fracture: Secondary | ICD-10-CM | POA: Diagnosis present

## 2016-02-21 DIAGNOSIS — R0902 Hypoxemia: Secondary | ICD-10-CM | POA: Diagnosis not present

## 2016-02-21 DIAGNOSIS — Z7982 Long term (current) use of aspirin: Secondary | ICD-10-CM | POA: Diagnosis not present

## 2016-02-21 DIAGNOSIS — E871 Hypo-osmolality and hyponatremia: Secondary | ICD-10-CM | POA: Diagnosis present

## 2016-02-21 DIAGNOSIS — Z79899 Other long term (current) drug therapy: Secondary | ICD-10-CM | POA: Diagnosis not present

## 2016-02-21 DIAGNOSIS — J449 Chronic obstructive pulmonary disease, unspecified: Secondary | ICD-10-CM | POA: Diagnosis not present

## 2016-02-21 DIAGNOSIS — R000 Tachycardia, unspecified: Secondary | ICD-10-CM | POA: Diagnosis present

## 2016-02-21 DIAGNOSIS — Z9981 Dependence on supplemental oxygen: Secondary | ICD-10-CM | POA: Diagnosis not present

## 2016-02-21 DIAGNOSIS — J441 Chronic obstructive pulmonary disease with (acute) exacerbation: Secondary | ICD-10-CM | POA: Diagnosis not present

## 2016-02-21 DIAGNOSIS — D751 Secondary polycythemia: Secondary | ICD-10-CM | POA: Diagnosis present

## 2016-02-21 DIAGNOSIS — Z888 Allergy status to other drugs, medicaments and biological substances status: Secondary | ICD-10-CM | POA: Diagnosis not present

## 2016-02-21 DIAGNOSIS — J9621 Acute and chronic respiratory failure with hypoxia: Secondary | ICD-10-CM | POA: Diagnosis present

## 2016-02-21 DIAGNOSIS — K210 Gastro-esophageal reflux disease with esophagitis: Secondary | ICD-10-CM | POA: Diagnosis present

## 2016-02-21 DIAGNOSIS — Z880 Allergy status to penicillin: Secondary | ICD-10-CM | POA: Diagnosis not present

## 2016-02-21 DIAGNOSIS — Z885 Allergy status to narcotic agent status: Secondary | ICD-10-CM | POA: Diagnosis not present

## 2016-02-21 DIAGNOSIS — Z87891 Personal history of nicotine dependence: Secondary | ICD-10-CM | POA: Diagnosis not present

## 2016-02-21 DIAGNOSIS — E876 Hypokalemia: Secondary | ICD-10-CM | POA: Diagnosis present

## 2016-02-21 LAB — CBC
HCT: 35.8 % — ABNORMAL LOW (ref 36.0–46.0)
Hemoglobin: 12.2 g/dL (ref 12.0–15.0)
MCH: 31.4 pg (ref 26.0–34.0)
MCHC: 34.1 g/dL (ref 30.0–36.0)
MCV: 92 fL (ref 78.0–100.0)
PLATELETS: 276 10*3/uL (ref 150–400)
RBC: 3.89 MIL/uL (ref 3.87–5.11)
RDW: 12.7 % (ref 11.5–15.5)
WBC: 7.6 10*3/uL (ref 4.0–10.5)

## 2016-02-21 LAB — COMPREHENSIVE METABOLIC PANEL
ALT: 10 U/L — AB (ref 14–54)
AST: 10 U/L — AB (ref 15–41)
Albumin: 3 g/dL — ABNORMAL LOW (ref 3.5–5.0)
Alkaline Phosphatase: 72 U/L (ref 38–126)
Anion gap: 10 (ref 5–15)
BUN: 6 mg/dL (ref 6–20)
CHLORIDE: 91 mmol/L — AB (ref 101–111)
CO2: 31 mmol/L (ref 22–32)
CREATININE: 0.42 mg/dL — AB (ref 0.44–1.00)
Calcium: 9.2 mg/dL (ref 8.9–10.3)
GFR calc Af Amer: >60 mL/min (ref >60)
GFR calc non Af Amer: >60 mL/min (ref >60)
Glucose, Bld: 170 mg/dL — ABNORMAL HIGH (ref 65–99)
Potassium: 3.3 mmol/L — ABNORMAL LOW (ref 3.5–5.1)
SODIUM: 132 mmol/L — AB (ref 135–145)
Total Bilirubin: 0.5 mg/dL (ref 0.3–1.2)
Total Protein: 6.3 g/dL — ABNORMAL LOW (ref 6.5–8.1)

## 2016-02-21 MED ORDER — ACETAMINOPHEN 325 MG PO TABS
650.0000 mg | ORAL_TABLET | ORAL | Status: DC | PRN
  Administered 2016-02-21: 650 mg via ORAL
  Filled 2016-02-21: qty 2

## 2016-02-21 MED ORDER — POTASSIUM CHLORIDE 20 MEQ PO PACK
40.0000 meq | PACK | Freq: Once | ORAL | Status: AC
  Administered 2016-02-21: 40 meq via ORAL
  Filled 2016-02-21: qty 2

## 2016-02-21 NOTE — Progress Notes (Signed)
Subjective: Patient was admitted due to acute exacerbation of COPD. Patient is receiving nebulizer and IV steroid. Patient still has wheezing and shortness of breath. No fever or chest pain.  Objective: Vital signs in last 24 hours: Temp:  [97.5 F (36.4 C)-97.9 F (36.6 C)] 97.8 F (36.6 C) (11/27 1349) Pulse Rate:  [95-107] 107 (11/27 1349) Resp:  [15-21] 21 (11/27 1349) BP: (155-166)/(81-95) 166/95 (11/27 1349) SpO2:  [95 %-98 %] 98 % (11/27 1937) Weight change:  Last BM Date: 02/20/16  Intake/Output from previous day: 11/26 0701 - 11/27 0700 In: 586 [P.O.:480; I.V.:6; IV Piggyback:100] Out: -   PHYSICAL EXAM General appearance: alert and no distress Resp: diminished breath sounds bilaterally and wheezes bilaterally Cardio: regular rate and rhythm, S1, S2 normal, no murmur, click, rub or gallop GI: soft, non-tender; bowel sounds normal; no masses,  no organomegaly Extremities: extremities normal, atraumatic, no cyanosis or edema  Lab Results:  Results for orders placed or performed during the hospital encounter of 02/20/16 (from the past 48 hour(s))  Comprehensive metabolic panel     Status: Abnormal   Collection Time: 02/20/16 10:13 AM  Result Value Ref Range   Sodium 132 (L) 135 - 145 mmol/L   Potassium 2.9 (L) 3.5 - 5.1 mmol/L   Chloride 89 (L) 101 - 111 mmol/L   CO2 32 22 - 32 mmol/L   Glucose, Bld 131 (H) 65 - 99 mg/dL   BUN 7 6 - 20 mg/dL   Creatinine, Ser 0.48 0.44 - 1.00 mg/dL   Calcium 9.2 8.9 - 10.3 mg/dL   Total Protein 6.9 6.5 - 8.1 g/dL   Albumin 3.4 (L) 3.5 - 5.0 g/dL   AST 14 (L) 15 - 41 U/L   ALT 11 (L) 14 - 54 U/L   Alkaline Phosphatase 82 38 - 126 U/L   Total Bilirubin 0.7 0.3 - 1.2 mg/dL   GFR calc non Af Amer >60 >60 mL/min   GFR calc Af Amer >60 >60 mL/min    Comment: (NOTE) The eGFR has been calculated using the CKD EPI equation. This calculation has not been validated in all clinical situations. eGFR's persistently <60 mL/min signify  possible Chronic Kidney Disease.    Anion gap 11 5 - 15  Brain natriuretic peptide     Status: None   Collection Time: 02/20/16 10:13 AM  Result Value Ref Range   B Natriuretic Peptide 24.0 0.0 - 100.0 pg/mL  Troponin I     Status: None   Collection Time: 02/20/16 10:13 AM  Result Value Ref Range   Troponin I <0.03 <0.03 ng/mL  CBC with Differential     Status: None   Collection Time: 02/20/16 10:13 AM  Result Value Ref Range   WBC 8.5 4.0 - 10.5 K/uL   RBC 4.19 3.87 - 5.11 MIL/uL   Hemoglobin 13.2 12.0 - 15.0 g/dL   HCT 39.0 36.0 - 46.0 %   MCV 93.1 78.0 - 100.0 fL   MCH 31.5 26.0 - 34.0 pg   MCHC 33.8 30.0 - 36.0 g/dL   RDW 13.0 11.5 - 15.5 %   Platelets 284 150 - 400 K/uL   Neutrophils Relative % 77 %   Neutro Abs 6.6 1.7 - 7.7 K/uL   Lymphocytes Relative 9 %   Lymphs Abs 0.8 0.7 - 4.0 K/uL   Monocytes Relative 10 %   Monocytes Absolute 0.9 0.1 - 1.0 K/uL   Eosinophils Relative 4 %   Eosinophils Absolute 0.3 0.0 - 0.7  K/uL   Basophils Relative 0 %   Basophils Absolute 0.0 0.0 - 0.1 K/uL  Comprehensive metabolic panel     Status: Abnormal   Collection Time: 02/21/16  4:37 AM  Result Value Ref Range   Sodium 132 (L) 135 - 145 mmol/L   Potassium 3.3 (L) 3.5 - 5.1 mmol/L   Chloride 91 (L) 101 - 111 mmol/L   CO2 31 22 - 32 mmol/L   Glucose, Bld 170 (H) 65 - 99 mg/dL   BUN 6 6 - 20 mg/dL   Creatinine, Ser 0.42 (L) 0.44 - 1.00 mg/dL   Calcium 9.2 8.9 - 10.3 mg/dL   Total Protein 6.3 (L) 6.5 - 8.1 g/dL   Albumin 3.0 (L) 3.5 - 5.0 g/dL   AST 10 (L) 15 - 41 U/L   ALT 10 (L) 14 - 54 U/L   Alkaline Phosphatase 72 38 - 126 U/L   Total Bilirubin 0.5 0.3 - 1.2 mg/dL   GFR calc non Af Amer >60 >60 mL/min   GFR calc Af Amer >60 >60 mL/min    Comment: (NOTE) The eGFR has been calculated using the CKD EPI equation. This calculation has not been validated in all clinical situations. eGFR's persistently <60 mL/min signify possible Chronic Kidney Disease.    Anion gap 10 5 -  15  CBC     Status: Abnormal   Collection Time: 02/21/16  4:37 AM  Result Value Ref Range   WBC 7.6 4.0 - 10.5 K/uL   RBC 3.89 3.87 - 5.11 MIL/uL   Hemoglobin 12.2 12.0 - 15.0 g/dL   HCT 35.8 (L) 36.0 - 46.0 %   MCV 92.0 78.0 - 100.0 fL   MCH 31.4 26.0 - 34.0 pg   MCHC 34.1 30.0 - 36.0 g/dL   RDW 12.7 11.5 - 15.5 %   Platelets 276 150 - 400 K/uL    ABGS No results for input(s): PHART, PO2ART, TCO2, HCO3 in the last 72 hours.  Invalid input(s): PCO2 CULTURES No results found for this or any previous visit (from the past 240 hour(s)). Studies/Results: Dg Chest 2 View  Result Date: 02/20/2016 CLINICAL DATA:  Shortness of breath starting last night, history of bronchitis EXAM: CHEST  2 VIEW COMPARISON:  03/25/2015 FINDINGS: Cardiomediastinal silhouette is stable. Hyperinflation again noted. No acute infiltrate or pleural effusion. No pulmonary edema. Osteopenia and mild degenerative changes thoracic spine. Stable minimal compression deformity lower thoracic spine. IMPRESSION: No active cardiopulmonary disease.  Hyperinflation again noted. Electronically Signed   By: Lahoma Crocker M.D.   On: 02/20/2016 10:08    Medications: I have reviewed the patient's current medications.  Assesment: Principal Problem:   COPD with acute exacerbation ([place]) Active Problems:   Reflux esophagitis   COPD exacerbation ([facility])   Acute hypokalemia    Plan: Medications reviewed Continue nebulizer treatment Continue IV steroid. Continue regular treatment    LOS: 0 days   Leah Dalton 02/21/2016, 8:23 PM

## 2016-02-21 NOTE — Care Management Obs Status (Signed)
[facility] NOTIFICATION   Patient Details  Name: Leah Dalton MRN: [id number] Date of Birth: 08-17-1945   Medicare Observation Status Notification Given:  Yes    Sherald Barge, RN 02/21/2016, 11:49 AM

## 2016-02-22 NOTE — Progress Notes (Signed)
Subjective: Patient is slightry improving and feels better. No fever or chills..  Objective: Vital signs in last 24 hours: Temp:  [97.1 F (36.2 C)-97.8 F (36.6 C)] 97.1 F (36.2 C) (11/28 0455) Pulse Rate:  [90-107] 90 (11/28 0455) Resp:  [20-21] 20 (11/28 0455) BP: (148-169)/(78-95) 148/91 (11/28 0455) SpO2:  [91 %-98 %] 91 % (11/28 0751) Weight change:  Last BM Date: 02/20/16  Intake/Output from previous day: 11/27 0701 - 11/28 0700 In: 823 [P.O.:720; I.V.:3; IV Piggyback:100] Out: -   PHYSICAL EXAM General appearance: alert and no distress Resp: diminished breath sounds bilaterally and wheezes bilaterally Cardio: regular rate and rhythm, S1, S2 normal, no murmur, click, rub or gallop GI: soft, non-tender; bowel sounds normal; no masses,  no organomegaly Extremities: extremities normal, atraumatic, no cyanosis or edema  Lab Results:  Results for orders placed or performed during the hospital encounter of 02/20/16 (from the past 48 hour(s))  Comprehensive metabolic panel     Status: Abnormal   Collection Time: 02/20/16 10:13 AM  Result Value Ref Range   Sodium 132 (L) 135 - 145 mmol/L   Potassium 2.9 (L) 3.5 - 5.1 mmol/L   Chloride 89 (L) 101 - 111 mmol/L   CO2 32 22 - 32 mmol/L   Glucose, Bld 131 (H) 65 - 99 mg/dL   BUN 7 6 - 20 mg/dL   Creatinine, Ser 0.48 0.44 - 1.00 mg/dL   Calcium 9.2 8.9 - 10.3 mg/dL   Total Protein 6.9 6.5 - 8.1 g/dL   Albumin 3.4 (L) 3.5 - 5.0 g/dL   AST 14 (L) 15 - 41 U/L   ALT 11 (L) 14 - 54 U/L   Alkaline Phosphatase 82 38 - 126 U/L   Total Bilirubin 0.7 0.3 - 1.2 mg/dL   GFR calc non Af Amer >60 >60 mL/min   GFR calc Af Amer >60 >60 mL/min    Comment: (NOTE) The eGFR has been calculated using the CKD EPI equation. This calculation has not been validated in all clinical situations. eGFR's persistently <60 mL/min signify possible Chronic Kidney Disease.    Anion gap 11 5 - 15  Brain natriuretic peptide     Status: None   Collection Time: 02/20/16 10:13 AM  Result Value Ref Range   B Natriuretic Peptide 24.0 0.0 - 100.0 pg/mL  Troponin I     Status: None   Collection Time: 02/20/16 10:13 AM  Result Value Ref Range   Troponin I <0.03 <0.03 ng/mL  CBC with Differential     Status: None   Collection Time: 02/20/16 10:13 AM  Result Value Ref Range   WBC 8.5 4.0 - 10.5 K/uL   RBC 4.19 3.87 - 5.11 MIL/uL   Hemoglobin 13.2 12.0 - 15.0 g/dL   HCT 39.0 36.0 - 46.0 %   MCV 93.1 78.0 - 100.0 fL   MCH 31.5 26.0 - 34.0 pg   MCHC 33.8 30.0 - 36.0 g/dL   RDW 13.0 11.5 - 15.5 %   Platelets 284 150 - 400 K/uL   Neutrophils Relative % 77 %   Neutro Abs 6.6 1.7 - 7.7 K/uL   Lymphocytes Relative 9 %   Lymphs Abs 0.8 0.7 - 4.0 K/uL   Monocytes Relative 10 %   Monocytes Absolute 0.9 0.1 - 1.0 K/uL   Eosinophils Relative 4 %   Eosinophils Absolute 0.3 0.0 - 0.7 K/uL   Basophils Relative 0 %   Basophils Absolute 0.0 0.0 - 0.1 K/uL  Comprehensive metabolic  panel     Status: Abnormal   Collection Time: 02/21/16  4:37 AM  Result Value Ref Range   Sodium 132 (L) 135 - 145 mmol/L   Potassium 3.3 (L) 3.5 - 5.1 mmol/L   Chloride 91 (L) 101 - 111 mmol/L   CO2 31 22 - 32 mmol/L   Glucose, Bld 170 (H) 65 - 99 mg/dL   BUN 6 6 - 20 mg/dL   Creatinine, Ser 0.42 (L) 0.44 - 1.00 mg/dL   Calcium 9.2 8.9 - 10.3 mg/dL   Total Protein 6.3 (L) 6.5 - 8.1 g/dL   Albumin 3.0 (L) 3.5 - 5.0 g/dL   AST 10 (L) 15 - 41 U/L   ALT 10 (L) 14 - 54 U/L   Alkaline Phosphatase 72 38 - 126 U/L   Total Bilirubin 0.5 0.3 - 1.2 mg/dL   GFR calc non Af Amer >60 >60 mL/min   GFR calc Af Amer >60 >60 mL/min    Comment: (NOTE) The eGFR has been calculated using the CKD EPI equation. This calculation has not been validated in all clinical situations. eGFR's persistently <60 mL/min signify possible Chronic Kidney Disease.    Anion gap 10 5 - 15  CBC     Status: Abnormal   Collection Time: 02/21/16  4:37 AM  Result Value Ref Range   WBC 7.6 4.0  - 10.5 K/uL   RBC 3.89 3.87 - 5.11 MIL/uL   Hemoglobin 12.2 12.0 - 15.0 g/dL   HCT 35.8 (L) 36.0 - 46.0 %   MCV 92.0 78.0 - 100.0 fL   MCH 31.4 26.0 - 34.0 pg   MCHC 34.1 30.0 - 36.0 g/dL   RDW 12.7 11.5 - 15.5 %   Platelets 276 150 - 400 K/uL    ABGS No results for input(s): PHART, PO2ART, TCO2, HCO3 in the last 72 hours.  Invalid input(s): PCO2 CULTURES No results found for this or any previous visit (from the past 240 hour(s)). Studies/Results: Dg Chest 2 View  Result Date: 02/20/2016 CLINICAL DATA:  Shortness of breath starting last night, history of bronchitis EXAM: CHEST  2 VIEW COMPARISON:  03/25/2015 FINDINGS: Cardiomediastinal silhouette is stable. Hyperinflation again noted. No acute infiltrate or pleural effusion. No pulmonary edema. Osteopenia and mild degenerative changes thoracic spine. Stable minimal compression deformity lower thoracic spine. IMPRESSION: No active cardiopulmonary disease.  Hyperinflation again noted. Electronically Signed   By: Lahoma Crocker M.D.   On: 02/20/2016 10:08    Medications: I have reviewed the patient's current medications.  Assesment: Principal Problem:   COPD with acute exacerbation ([place]) Active Problems:   Reflux esophagitis   COPD exacerbation ([facility])   Acute hypokalemia    Plan: Medications reviewed Continue nebulizer treatment Continue IV steroid. Continue regular treatment    LOS: 1 day   Leah Dalton 02/22/2016, 8:24 AM

## 2016-02-23 MED ORDER — GUAIFENESIN ER 600 MG PO TB12
1200.0000 mg | ORAL_TABLET | Freq: Two times a day (BID) | ORAL | Status: DC
  Administered 2016-02-23 – 2016-02-26 (×7): 1200 mg via ORAL
  Filled 2016-02-23 (×7): qty 2

## 2016-02-23 MED ORDER — METHYLPREDNISOLONE SODIUM SUCC 125 MG IJ SOLR
80.0000 mg | Freq: Four times a day (QID) | INTRAMUSCULAR | Status: DC
  Administered 2016-02-23 – 2016-02-25 (×8): 80 mg via INTRAVENOUS
  Filled 2016-02-23 (×8): qty 2

## 2016-02-23 MED ORDER — IPRATROPIUM-ALBUTEROL 0.5-2.5 (3) MG/3ML IN SOLN
3.0000 mL | RESPIRATORY_TRACT | Status: DC
  Administered 2016-02-23 – 2016-02-25 (×10): 3 mL via RESPIRATORY_TRACT
  Filled 2016-02-23 (×11): qty 3

## 2016-02-23 MED ORDER — FLUTICASONE FUROATE-VILANTEROL 200-25 MCG/INH IN AEPB
1.0000 | INHALATION_SPRAY | Freq: Every day | RESPIRATORY_TRACT | Status: DC
  Administered 2016-02-23 – 2016-02-26 (×4): 1 via RESPIRATORY_TRACT
  Filled 2016-02-23 (×2): qty 28

## 2016-02-23 NOTE — Care Management Important Message (Signed)
Important Message  Patient Details  Name: Leah Dalton MRN: [id number] Date of Birth: 1945-05-05   Medicare Important Message Given:  Yes    Corie Vavra, Chauncey Reading, RN 02/23/2016, 2:06 PM

## 2016-02-23 NOTE — Evaluation (Signed)
Physical Therapy Evaluation Patient Details Name: Leah Dalton MRN: [id number] DOB: 1945-07-14 Today's Date: 02/23/2016   History of Present Illness  70 y.o. female with medical history significant of COPD and GERD presents to the ed with  Complaints of worsening cough, wheezing, and SOB for the past month.  She has been a patient of Dr Luan Pulling, and had been seen in the urgent care, received 6 days of prednisone and an antibiotic, but she didn't get better.  She saw Dr Luan Pulling, and was placed on Prednisone again, using her inhaler and her oxygen concentrator, but still having coughs and SOB.  She denied any chest pain, fever, chills or orthopnea.  Evaluation in the ER included a CXR with no definite infiltrate, but hyperinflated lungs, normal WBC, K of 2.9 mm/dL, and normal Cr.  Her EKG showed ST with non specific ST T changes.  She was given IV Steroids, nebs, and IV Levoquin.  Hospitalist was asked to admit her for COPD exacerbation with failed outpatient therapy.   Clinical Impression  Pt received in bed, dtr-in-law present, and pt is agreeable to PT evaluation.  Pt expressed that she is independent with ambulation at home, and independent with ADL's, but requires assistance with IADL's.  During PT evaluation today, she demonstrated all functional mobility at at independent level.  She ambulated 37ft with no DME and SpO2 remained >93% while on RA.  Discussed importance of energy conservation at home, and pacing herself.  Pt states she has been doing that for years.  At this point, she does not demonstrate need for skilled PT, therefore will sign off.  Encouraged pt to ambulate with family and nursing staff.     Follow Up Recommendations No PT follow up    Equipment Recommendations  None recommended by PT    Recommendations for Other Services       Precautions / Restrictions Precautions Precautions: None Restrictions Weight Bearing Restrictions: No      Mobility  Bed  Mobility Overal bed mobility: Modified Independent             General bed mobility comments: HOB raised  Transfers Overall transfer level: Independent Equipment used: None                Ambulation/Gait Ambulation/Gait assistance: Independent Ambulation Distance (Feet): 60 Feet Assistive device: None Gait Pattern/deviations: Step-through pattern Gait velocity: decreased cadence Gait velocity interpretation: <1.8 ft/sec, indicative of risk for recurrent falls    Stairs            Wheelchair Mobility    Modified Rankin (Stroke Patients Only)       Balance Overall balance assessment: Independent                                           Pertinent Vitals/Pain Pain Assessment: No/denies pain    Home Living   Living Arrangements: Alone Available Help at Discharge: Family (son and dtr in [employer].  Call to check in.)   Home Access: Stairs to enter   Entrance Stairs-Number of Steps: 3 steps with HR.  Home Layout: One level Home Equipment: Other (comment) (O2 - 2L all the time, Craftmatic adjustable bed. )      Prior Function Level of Independence: Independent   Gait / Transfers Assistance Needed: independent.   ADL's / Homemaking Assistance Needed: Pt has not driven for 3 years.  [name]  assists with groceries on the 1st and 3rd Wed of the month.  Pt has assistance for household chores, and gets frozen meals because she can't stand long enough to cook a meal.   Comments: Pt states that for the past month she hasn't been doing as good of a job.  Has been struggling since Oct 25.       Hand Dominance   Dominant Hand: Right    Extremity/Trunk Assessment   Upper Extremity Assessment: Overall WFL for tasks assessed           Lower Extremity Assessment: Overall WFL for tasks assessed         Communication      Cognition Arousal/Alertness: Awake/alert Behavior During Therapy: WFL for tasks assessed/performed Overall  Cognitive Status: Within Functional Limits for tasks assessed                      General Comments      Exercises     Assessment/Plan    PT Assessment [place] does not need any further PT services  PT Problem List            PT Treatment Interventions      PT Goals (Current goals can be found in the Care Plan section)  Acute Rehab PT Goals PT Goal Formulation: All assessment and education complete, DC therapy    Frequency     Barriers to discharge        Co-evaluation               End of Session Equipment Utilized During Treatment: Gait belt;Oxygen Activity Tolerance: Patient limited by fatigue Patient left: in bed;with call bell/phone within reach;with family/visitor present Nurse Communication: Mobility status Leah Pro, RN informed of pt's mobility status, and mobility sheet left in the room.  )    Functional Assessment Tool Used: [place] "6-clicks"  Functional Limitation: Mobility: Walking and moving around Mobility: Walking and Moving Around Current Status [phone]): At least 1 percent but less than 20 percent impaired, limited or restricted Mobility: Walking and Moving Around Goal Status [phone]): At least 1 percent but less than 20 percent impaired, limited or restricted Mobility: Walking and Moving Around Discharge Status [phone]): At least 1 percent but less than 20 percent impaired, limited or restricted    Time: 1315-1339 PT Time Calculation (min) (ACUTE ONLY): 24 min   Charges:   PT Evaluation $PT Eval Low Complexity: 1 Procedure PT Treatments $Gait Training: 8-22 mins   PT G Codes:   PT G-Codes **NOT FOR INPATIENT CLASS** Functional Assessment Tool Used: [employer] "6-clicks"  Functional Limitation: Mobility: Walking and moving around Mobility: Walking and Moving Around Current Status [phone]): At least 1 percent but less than 20 percent impaired, limited or restricted Mobility: Walking and Moving Around  Goal Status [phone]): At least 1 percent but less than 20 percent impaired, limited or restricted Mobility: Walking and Moving Around Discharge Status [phone]): At least 1 percent but less than 20 percent impaired, limited or restricted    Leah Dalton, PT, DPT X: [phone]

## 2016-02-23 NOTE — Progress Notes (Signed)
Subjective: She says she feels better but not at baseline. She is more short of breath and she has been. She is coughing some. Not bringing up much sputum and what she does bring up is clear. She is very weak. She is very short of breath with exertion.  Objective: Vital signs in last 24 hours: Temp:  [97.6 F (36.4 C)-97.7 F (36.5 C)] 97.7 F (36.5 C) (11/29 0508) Pulse Rate:  [83-100] 83 (11/29 0508) Resp:  [18-20] 20 (11/29 0508) BP: (152-175)/(71-98) 155/71 (11/29 0508) SpO2:  [97 %-99 %] 97 % (11/29 0734) Weight change:  Last BM Date: 02/20/16  Intake/Output from previous day: 11/28 0701 - 11/29 0700 In: 1143 [P.O.:1140; I.V.:3] Out: 200 [Urine:200]  PHYSICAL EXAM General appearance: alert, cooperative and mild distress Resp: Her respiratory effort is increased. She has bilateral rhonchi on lung examination Cardio: regular rate and rhythm, S1, S2 normal, no murmur, click, rub or gallop GI: soft, non-tender; bowel sounds normal; no masses,  no organomegaly Extremities: She has trace edema and some venous stasis changes Her skin is warm and dry. Her pupils react. Mucous membranes are moist  Lab Results:  No results found for this or any previous visit (from the past 48 hour(s)).  ABGS No results for input(s): PHART, PO2ART, TCO2, HCO3 in the last 72 hours.  Invalid input(s): PCO2 CULTURES No results found for this or any previous visit (from the past 240 hour(s)). Studies/Results: No results found.  Medications:  I have reviewed the patient's current medications. Prior to Admission:  Prescriptions Prior to Admission  Medication Sig Dispense Refill Last Dose  . albuterol (PROVENTIL HFA;VENTOLIN HFA) 108 (90 BASE) MCG/ACT inhaler Inhale 2 puffs into the lungs every 6 (six) hours as needed for wheezing.   02/20/2016 at Unknown time  . albuterol (PROVENTIL) (2.5 MG/3ML) 0.083% nebulizer solution Take 2.5 mg by nebulization 3 (three) times daily.   02/19/2016 at Unknown  time  . aspirin EC 81 MG tablet Take 81 mg by mouth daily.    02/19/2016 at Unknown time  . calcium carbonate (OS-CAL) 600 MG TABS Take 600 mg by mouth 2 (two) times daily with a meal.   02/19/2016 at Unknown time  . Cholecalciferol (VITAMIN D-3) 5000 UNITS TABS Take 1 tablet by mouth daily.   02/19/2016 at Unknown time  . dextromethorphan-guaiFENesin (MUCINEX DM) 30-600 MG per 12 hr tablet Take 1 tablet by mouth daily.    02/19/2016 at Unknown time  . Fluticasone-Salmeterol (ADVAIR) 250-50 MCG/DOSE AEPB Inhale 1 puff into the lungs every 12 (twelve) hours.   02/20/2016 at Unknown time  . furosemide (LASIX) 40 MG tablet Take 40 mg by mouth daily as needed for fluid.    Past Week at Unknown time  . ipratropium (ATROVENT) 0.02 % nebulizer solution Take 500 mcg by nebulization 3 (three) times daily.   02/19/2016 at Unknown time  . pantoprazole (PROTONIX) 40 MG tablet Take 1 tablet (40 mg total) by mouth 2 (two) times daily. (Patient taking differently: Take 40 mg by mouth daily. ) 60 tablet 5 02/19/2016 at Unknown time   Scheduled: . aspirin EC  81 mg Oral Daily  . calcium carbonate  1,250 mg Oral BID WC  . cholecalciferol  5,000 Units Oral Daily  . dextromethorphan-guaiFENesin  1 tablet Oral Daily  . fluticasone furoate-vilanterol  1 puff Inhalation Daily  . guaiFENesin  1,200 mg Oral BID  . heparin  5,000 Units Subcutaneous Q8H  . ipratropium-albuterol  3 mL Nebulization Q4H  .  levofloxacin (LEVAQUIN) IV  500 mg Intravenous Q24H  . methylPREDNISolone (SOLU-MEDROL) injection  80 mg Intravenous Q6H  . pantoprazole  40 mg Oral BID  . sodium chloride flush  3 mL Intravenous Q12H   Continuous:   Assesment: She is admitted with COPD exacerbation. She is improving but slowly. She has severe COPD at baseline. She is very deconditioned. She is still intensely symptomatic Principal Problem:   COPD with acute exacerbation ([place]) Active Problems:   Reflux esophagitis   COPD exacerbation ([facility])    Acute hypokalemia    Plan: I will plan to increase his steroids. Switch her to DuoNeb. Add flutter valve. Add Mucinex. Request PT consultation.    LOS: 2 days   Darrien Laakso L 02/23/2016, 7:54 AM

## 2016-02-24 MED ORDER — BOOST / RESOURCE BREEZE PO LIQD
1.0000 | Freq: Three times a day (TID) | ORAL | Status: DC
  Administered 2016-02-24 – 2016-02-25 (×5): 1 via ORAL

## 2016-02-24 NOTE — Progress Notes (Signed)
Subjective: She says she feels better today. Not as short of breath. She's using a flutter valve and it's helping her to produce sputum. She still feels very weak. She has poor appetite. No fever or chills. No chest pain. No hemoptysis  Objective: Vital signs in last 24 hours: Temp:  [97.8 F (36.6 C)-98.4 F (36.9 C)] 98.1 F (36.7 C) (11/30 0522) Pulse Rate:  [82-113] 87 (11/30 0522) Resp:  [16-20] 20 (11/30 0522) BP: (133-173)/(62-93) 133/62 (11/30 0522) SpO2:  [92 %-100 %] 100 % (11/30 0757) Weight change:  Last BM Date: 02/20/16  Intake/Output from previous day: 11/29 0701 - 11/30 0700 In: 1400 [P.O.:1200; IV Piggyback:200] Out: 300 [Urine:300]  PHYSICAL EXAM General appearance: alert, cooperative and mild distress Resp: She still has bilateral rhonchi but her chest is clearer than yesterday Cardio: regular rate and rhythm, S1, S2 normal, no murmur, click, rub or gallop GI: soft, non-tender; bowel sounds normal; no masses,  no organomegaly Extremities: Still trace edema and chronic venous stasis Skin warm and dry. Pupils are reactive. Mucous membranes are moist  Lab Results:  No results found for this or any previous visit (from the past 48 hour(s)).  ABGS No results for input(s): PHART, PO2ART, TCO2, HCO3 in the last 72 hours.  Invalid input(s): PCO2 CULTURES No results found for this or any previous visit (from the past 240 hour(s)). Studies/Results: No results found.  Medications:  Prior to Admission:  Prescriptions Prior to Admission  Medication Sig Dispense Refill Last Dose  . albuterol (PROVENTIL HFA;VENTOLIN HFA) 108 (90 BASE) MCG/ACT inhaler Inhale 2 puffs into the lungs every 6 (six) hours as needed for wheezing.   02/20/2016 at Unknown time  . albuterol (PROVENTIL) (2.5 MG/3ML) 0.083% nebulizer solution Take 2.5 mg by nebulization 3 (three) times daily.   02/19/2016 at Unknown time  . aspirin EC 81 MG tablet Take 81 mg by mouth daily.    02/19/2016 at  Unknown time  . calcium carbonate (OS-CAL) 600 MG TABS Take 600 mg by mouth 2 (two) times daily with a meal.   02/19/2016 at Unknown time  . Cholecalciferol (VITAMIN D-3) 5000 UNITS TABS Take 1 tablet by mouth daily.   02/19/2016 at Unknown time  . dextromethorphan-guaiFENesin (MUCINEX DM) 30-600 MG per 12 hr tablet Take 1 tablet by mouth daily.    02/19/2016 at Unknown time  . Fluticasone-Salmeterol (ADVAIR) 250-50 MCG/DOSE AEPB Inhale 1 puff into the lungs every 12 (twelve) hours.   02/20/2016 at Unknown time  . furosemide (LASIX) 40 MG tablet Take 40 mg by mouth daily as needed for fluid.    Past Week at Unknown time  . ipratropium (ATROVENT) 0.02 % nebulizer solution Take 500 mcg by nebulization 3 (three) times daily.   02/19/2016 at Unknown time  . pantoprazole (PROTONIX) 40 MG tablet Take 1 tablet (40 mg total) by mouth 2 (two) times daily. (Patient taking differently: Take 40 mg by mouth daily. ) 60 tablet 5 02/19/2016 at Unknown time   Scheduled: . aspirin EC  81 mg Oral Daily  . calcium carbonate  1,250 mg Oral BID WC  . cholecalciferol  5,000 Units Oral Daily  . dextromethorphan-guaiFENesin  1 tablet Oral Daily  . feeding supplement  1 Container Oral TID BM  . fluticasone furoate-vilanterol  1 puff Inhalation Daily  . guaiFENesin  1,200 mg Oral BID  . heparin  5,000 Units Subcutaneous Q8H  . ipratropium-albuterol  3 mL Nebulization Q4H  . levofloxacin (LEVAQUIN) IV  500 mg Intravenous  Q24H  . methylPREDNISolone (SOLU-MEDROL) injection  80 mg Intravenous Q6H  . pantoprazole  40 mg Oral BID  . sodium chloride flush  3 mL Intravenous Q12H   Continuous:  [id number], furosemide  Assesment: She has COPD with exacerbation. This is been a very prolonged exacerbation and she is been treated twice as an outpatient. She is improving now.  She has GERD which is stable.  She had low potassium on admission and that will be rechecked  She has hereditary hemochromatosis and that  will be rechecked Principal Problem:   COPD with acute exacerbation ([place]) Active Problems:   Reflux esophagitis   COPD exacerbation ([facility])   Acute hypokalemia    Plan: Continue IV medications for now. My plan is to switch her to by mouth tomorrow but I'm going to keep her in the hospital another 24 hours after that to make sure that she clears since this has been such a prolonged exacerbation    LOS: 3 days   Leah Dalton 02/24/2016, 8:09 AM

## 2016-02-24 NOTE — Care Management Note (Signed)
Case Management Note  Patient Details  Name: Leah Dalton MRN: [id number] Date of Birth: 14-Dec-1945  Subjective/Objective:    Patient adm with COPD exacerbation. Lives alone, ind with ADL's. She has home O2 and neb machine PTA. She has PCP, transportation, and insurance with prescription drug coverage. PT eval recommends no need for HHPT.                 Action/Plan: Anticipate DC home with self care.    Expected Discharge Date:     02/24/2016            Expected Discharge Plan:  Home/Self Care  In-House Referral:  NA  Discharge planning Services  CM Consult  Post Acute Care Choice:    Choice offered to:     DME Arranged:    DME Agency:     HH Arranged:    HH Agency:     Status of Service:  In process, will continue to follow  If discussed at Long Length of Stay Meetings, dates discussed:    Additional Comments:  Milind Raether, Chauncey Reading, RN 02/24/2016, 7:41 AM

## 2016-02-25 LAB — CBC WITH DIFFERENTIAL/PLATELET
BASOS ABS: 0 10*3/uL (ref 0.0–0.1)
Basophils Relative: 0 %
EOS ABS: 0 10*3/uL (ref 0.0–0.7)
Eosinophils Relative: 0 %
HEMATOCRIT: 36.5 % (ref 36.0–46.0)
HEMOGLOBIN: 12.1 g/dL (ref 12.0–15.0)
Lymphocytes Relative: 5 %
Lymphs Abs: 0.6 10*3/uL — ABNORMAL LOW (ref 0.7–4.0)
MCH: 30.6 pg (ref 26.0–34.0)
MCHC: 33.2 g/dL (ref 30.0–36.0)
MCV: 92.4 fL (ref 78.0–100.0)
MONO ABS: 0.6 10*3/uL (ref 0.1–1.0)
Monocytes Relative: 5 %
NEUTROS ABS: 11.7 10*3/uL — AB (ref 1.7–7.7)
NEUTROS PCT: 90 %
Platelets: 340 10*3/uL (ref 150–400)
RBC: 3.95 MIL/uL (ref 3.87–5.11)
RDW: 12.6 % (ref 11.5–15.5)
WBC: 12.9 10*3/uL — ABNORMAL HIGH (ref 4.0–10.5)

## 2016-02-25 LAB — BASIC METABOLIC PANEL
ANION GAP: 7 (ref 5–15)
BUN: 15 mg/dL (ref 6–20)
CALCIUM: 9.3 mg/dL (ref 8.9–10.3)
CO2: 34 mmol/L — AB (ref 22–32)
Chloride: 90 mmol/L — ABNORMAL LOW (ref 101–111)
Creatinine, Ser: 0.61 mg/dL (ref 0.44–1.00)
GFR calc non Af Amer: >60 mL/min (ref >60)
Glucose, Bld: 176 mg/dL — ABNORMAL HIGH (ref 65–99)
Potassium: 4.4 mmol/L (ref 3.5–5.1)
SODIUM: 131 mmol/L — AB (ref 135–145)

## 2016-02-25 MED ORDER — IPRATROPIUM-ALBUTEROL 0.5-2.5 (3) MG/3ML IN SOLN
3.0000 mL | RESPIRATORY_TRACT | Status: DC
  Administered 2016-02-25 – 2016-02-26 (×7): 3 mL via RESPIRATORY_TRACT
  Filled 2016-02-25 (×7): qty 3

## 2016-02-25 MED ORDER — PREDNISONE 20 MG PO TABS
40.0000 mg | ORAL_TABLET | Freq: Every day | ORAL | Status: DC
  Administered 2016-02-25 – 2016-02-26 (×2): 40 mg via ORAL
  Filled 2016-02-25 (×2): qty 2

## 2016-02-25 MED ORDER — LEVOFLOXACIN 500 MG PO TABS
500.0000 mg | ORAL_TABLET | Freq: Every day | ORAL | Status: DC
  Administered 2016-02-25 – 2016-02-26 (×2): 500 mg via ORAL
  Filled 2016-02-25 (×2): qty 1

## 2016-02-25 NOTE — Care Management Note (Signed)
Case Management Note  Patient Details  Name: Leah Dalton MRN: [id number] Date of Birth: 02/16/1946     Expected Discharge Date:       02/26/2016           Expected Discharge Plan:  Home/Self Care  In-House Referral:  NA  Discharge planning Services  CM Consult  Post Acute Care Choice:    Choice offered to:     DME Arranged:    DME Agency:     HH Arranged:    [place] Agency:     Status of Service:  In process, will continue to follow  If discussed at Long Length of Stay Meetings, dates discussed:    Additional Comments: Referral for [facility] RN received by [facility] health, and aware of patient discharging tomorrow. HH order will need to be faxed to [facility] tomorrow by nursing staff.   Billee Balcerzak, Chauncey Reading, RN 02/25/2016, 11:29 AM

## 2016-02-25 NOTE — Care Management (Signed)
Patient Information   Patient Name Danie, Hodson ([id number]) Sex Female DOB [birth year]  Room Bed  A316 A316-01  Patient Demographics   Address [place] [address] Phone [phone] [facility]) [phone] (Mobile) E-mail Address [email or web address]  Patient Ethnicity & Race   Ethnic Group Patient Race  Not Hispanic or Latino White or Caucasian  Emergency Contact(s)   Name Relation Home Work Mobile  Sepulveda,Scott Son [phone]  Devan, Madril [phone]    Documents on File    Status Date Received Description  Documents for the Patient  Release of Information Not Received    [place] Not Received    [place] E-Signature HIPAA Notice of Privacy Received [id number]   Driver's License Not Received    Insurance Card Not Received    Advance Directives/Living Will/HCPOA/POA Not Received    Insurance Card Not Received  [employer] Card Not Received    HIM ROI Authorization  04/15/14   HIM ROI Authorization  10/19/14   Release of Information  11/02/14   HIM ROI Authorization (Expired) 11/18/14 H&P, lab test (ABG/PFT), Discharge Summary.  Release of Information  11/19/14   Other Photo ID Not Received    HIM ROI Authorization  04/28/15   HIM ROI Authorization (Expired) 05/03/15 Need H&P notes, Lab Test (ABG/PFT), Discharge Summary. from 10/26/14 to present for continuity of care.  Release of Information  05/03/15   HIM ROI Authorization  11/24/15   Documents for the Encounter  AOB (Assignment of Insurance Benefits) Not Received    E-signature AOB Signed 02/20/16   MEDICARE RIGHTS Not Received    E-signature Medicare Rights Signed 02/20/16   Cardiac Monitoring Strip  02/20/16   Medicare Observation  02/22/16   ED Patient Billing Extract   ED PB Summary  ED Patient Billing Extract   ED Encounter Summary  ED Patient Billing Extract   ED PB Summary  EKG  02/21/16    Admission Information   Attending Provider Admitting Provider Admission Type Admission Date/Time  Sinda Du, MD Orvan Falconer, MD Emergency 02/20/16 [id number]  Discharge Date Hospital Service Auth/Cert Status Service Area   Internal Medicine Incomplete [place]  Unit Room/Bed Admission Status   AP-DEPT 300 A316/A316-01 Admission (Confirmed)   Admission   Complaint  COPD  Hospital Account   Name Acct ID Class Status Primary Coverage  Shawntaya, Mccreless [id number] Inpatient Open MEDICARE - MEDICARE PART A AND B      Guarantor Account (for Hospital Account [id number])   Name Relation to Pt Service Area Active? Acct Type  August Albino Self CHSA Yes Personal/Family  Address Phone    [place],  [address] [phone])        Coverage Information (for Hospital Account [id number])   1. [place] PART A AND B   F/O Payor/Plan Precert #  MEDICARE/MEDICARE PART A AND B   Subscriber Subscriber #  Jenaya, [place] [id number]  Address Phone  [address] [place], [place] [address]   2. GENERIC COMMERCIAL/GENERIC COMMERCIAL   F/O Payor/Plan Precert #  [facility] COMMERCIAL/GENERIC COMMERCIAL   Subscriber Subscriber #  M[place], Ferroni [id number]  Address Phone  [address] [place] [address] [phone]

## 2016-02-25 NOTE — Progress Notes (Signed)
Subjective: She feels better. No new complaints. Her breathing is improved. Appetite still not very good. Less cough. She still feels weak.  Objective: Vital signs in last 24 hours: Temp:  [97.3 F (36.3 C)-99.1 F (37.3 C)] 97.3 F (36.3 C) (12/01 0559) Pulse Rate:  [88-112] 88 (12/01 0559) Resp:  [20] 20 (12/01 0559) BP: (162-193)/(75-81) 162/75 (12/01 0559) SpO2:  [95 %-99 %] 97 % (12/01 0736) Weight change:  Last BM Date: 02/20/16  Intake/Output from previous day: 11/30 0701 - 12/01 0700 In: 600 [P.O.:600] Out: -   PHYSICAL EXAM General appearance: alert, cooperative and mild distress Resp: rhonchi bilaterally Cardio: regular rate and rhythm, S1, S2 normal, no murmur, click, rub or gallop GI: soft, non-tender; bowel sounds normal; no masses,  no organomegaly Extremities: extremities normal, atraumatic, no cyanosis or edema Skin warm and dry. Mucous membranes are moist. Pupils react.  Lab Results:  Results for orders placed or performed during the hospital encounter of 02/20/16 (from the past 48 hour(s))  CBC with Differential/Platelet     Status: Abnormal   Collection Time: 02/25/16  5:31 AM  Result Value Ref Range   WBC 12.9 (H) 4.0 - 10.5 K/uL   RBC 3.95 3.87 - 5.11 MIL/uL   Hemoglobin 12.1 12.0 - 15.0 g/dL   HCT 36.5 36.0 - 46.0 %   MCV 92.4 78.0 - 100.0 fL   MCH 30.6 26.0 - 34.0 pg   MCHC 33.2 30.0 - 36.0 g/dL   RDW 12.6 11.5 - 15.5 %   Platelets 340 150 - 400 K/uL   Neutrophils Relative % 90 %   Neutro Abs 11.7 (H) 1.7 - 7.7 K/uL   Lymphocytes Relative 5 %   Lymphs Abs 0.6 (L) 0.7 - 4.0 K/uL   Monocytes Relative 5 %   Monocytes Absolute 0.6 0.1 - 1.0 K/uL   Eosinophils Relative 0 %   Eosinophils Absolute 0.0 0.0 - 0.7 K/uL   Basophils Relative 0 %   Basophils Absolute 0.0 0.0 - 0.1 K/uL  Basic metabolic panel     Status: Abnormal   Collection Time: 02/25/16  5:31 AM  Result Value Ref Range   Sodium 131 (L) 135 - 145 mmol/L   Potassium 4.4 3.5 - 5.1  mmol/L   Chloride 90 (L) 101 - 111 mmol/L   CO2 34 (H) 22 - 32 mmol/L   Glucose, Bld 176 (H) 65 - 99 mg/dL   BUN 15 6 - 20 mg/dL   Creatinine, Ser 0.61 0.44 - 1.00 mg/dL   Calcium 9.3 8.9 - 10.3 mg/dL   GFR calc non Af Amer >60 >60 mL/min   GFR calc Af Amer >60 >60 mL/min    Comment: (NOTE) The eGFR has been calculated using the CKD EPI equation. This calculation has not been validated in all clinical situations. eGFR's persistently <60 mL/min signify possible Chronic Kidney Disease.    Anion gap 7 5 - 15    ABGS No results for input(s): PHART, PO2ART, TCO2, HCO3 in the last 72 hours.  Invalid input(s): PCO2 CULTURES No results found for this or any previous visit (from the past 240 hour(s)). Studies/Results: No results found.  Medications:  Prior to Admission:  Prescriptions Prior to Admission  Medication Sig Dispense Refill Last Dose  . albuterol (PROVENTIL HFA;VENTOLIN HFA) 108 (90 BASE) MCG/ACT inhaler Inhale 2 puffs into the lungs every 6 (six) hours as needed for wheezing.   02/20/2016 at Unknown time  . albuterol (PROVENTIL) (2.5 MG/3ML) 0.083% nebulizer solution  Take 2.5 mg by nebulization 3 (three) times daily.   02/19/2016 at Unknown time  . aspirin EC 81 MG tablet Take 81 mg by mouth daily.    02/19/2016 at Unknown time  . calcium carbonate (OS-CAL) 600 MG TABS Take 600 mg by mouth 2 (two) times daily with a meal.   02/19/2016 at Unknown time  . Cholecalciferol (VITAMIN D-3) 5000 UNITS TABS Take 1 tablet by mouth daily.   02/19/2016 at Unknown time  . dextromethorphan-guaiFENesin (MUCINEX DM) 30-600 MG per 12 hr tablet Take 1 tablet by mouth daily.    02/19/2016 at Unknown time  . Fluticasone-Salmeterol (ADVAIR) 250-50 MCG/DOSE AEPB Inhale 1 puff into the lungs every 12 (twelve) hours.   02/20/2016 at Unknown time  . furosemide (LASIX) 40 MG tablet Take 40 mg by mouth daily as needed for fluid.    Past Week at Unknown time  . ipratropium (ATROVENT) 0.02 % nebulizer  solution Take 500 mcg by nebulization 3 (three) times daily.   02/19/2016 at Unknown time  . pantoprazole (PROTONIX) 40 MG tablet Take 1 tablet (40 mg total) by mouth 2 (two) times daily. (Patient taking differently: Take 40 mg by mouth daily. ) 60 tablet 5 02/19/2016 at Unknown time   Scheduled: . aspirin EC  81 mg Oral Daily  . calcium carbonate  1,250 mg Oral BID WC  . cholecalciferol  5,000 Units Oral Daily  . dextromethorphan-guaiFENesin  1 tablet Oral Daily  . feeding supplement  1 Container Oral TID BM  . fluticasone furoate-vilanterol  1 puff Inhalation Daily  . guaiFENesin  1,200 mg Oral BID  . heparin  5,000 Units Subcutaneous Q8H  . ipratropium-albuterol  3 mL Nebulization Q4H WA  . levofloxacin  500 mg Oral Daily  . pantoprazole  40 mg Oral BID  . predniSONE  40 mg Oral Q breakfast  . sodium chloride flush  3 mL Intravenous Q12H   Continuous:  [id number], furosemide  Assesment: She was admitted with COPD with exacerbation. She was hypokalemic on admission and that's better. Her COPD is improving but slowly. She still complains of weakness but was not felt by physical therapy to need any ongoing therapy. Principal Problem:   COPD with acute exacerbation ([place]) Active Problems:   Reflux esophagitis   COPD exacerbation ([place])   Acute hypokalemia    Plan: Continue current treatments. Switch to oral antibiotics and steroids. Probable discharge home tomorrow with home health services    LOS: 4 days   Jaydi Bray L 02/25/2016, 8:30 AM

## 2016-02-25 NOTE — Care Management (Addendum)
Patient will discharge tomorrow 02/26/2016, will need Home health RN. [place] [facility] of [facility] notified and will obtain orders from chart. Patient aware the [facility] has 48 hours to initiate services.   Later entry: Patient has elected to use [facility] now after talking with a family friend. Will send referral.

## 2016-02-26 MED ORDER — GUAIFENESIN ER 600 MG PO TB12: 1200.0000 mg | ORAL_TABLET | Freq: Two times a day (BID) | ORAL | 5 refills | Status: DC

## 2016-02-26 MED ORDER — LEVOFLOXACIN 500 MG PO TABS: 500.0000 mg | ORAL_TABLET | Freq: Every day | ORAL | 1 refills | Status: DC

## 2016-02-26 MED ORDER — PREDNISONE 10 MG (21) PO TBPK: 10.0000 mg | ORAL_TABLET | Freq: Every day | ORAL | 1 refills | Status: DC

## 2016-02-26 NOTE — Progress Notes (Signed)
Patient states understanding of discharge instructions.  

## 2016-02-26 NOTE — Progress Notes (Signed)
Subjective: She says she feels better. She was admitted with COPD exacerbation. She is still coughing and still has some congestion but she is back approximately to baseline. She is able to ambulate better. No other new complaints. No chest pain nausea vomiting diarrhea abdominal pain or problems with her urine Objective: Vital signs in last 24 hours: Temp:  [97.9 F (36.6 C)-98.7 F (37.1 C)] 98.7 F (37.1 C) (12/02 0539) Pulse Rate:  [88-100] 88 (12/02 0539) Resp:  [20] 20 (12/02 0539) BP: (132-161)/(77-89) 150/77 (12/02 0539) SpO2:  [95 %-98 %] 97 % (12/02 0721) Weight change:  Last BM Date: 02/26/16  Intake/Output from previous day: 12/01 0701 - 12/02 0700 In: 240 [P.O.:240] Out: -   PHYSICAL EXAM General appearance: alert, cooperative and no distress Resp: Rhonchi bilaterally but much clearer than yesterday Cardio: regular rate and rhythm, S1, S2 normal, no murmur, click, rub or gallop GI: soft, non-tender; bowel sounds normal; no masses,  no organomegaly Extremities: Trace edema bilaterally Pupils react. Mucous membranes are moist. Skin warm and dry  Lab Results:  Results for orders placed or performed during the hospital encounter of 02/20/16 (from the past 48 hour(s))  CBC with Differential/Platelet     Status: Abnormal   Collection Time: 02/25/16  5:31 AM  Result Value Ref Range   WBC 12.9 (H) 4.0 - 10.5 K/uL   RBC 3.95 3.87 - 5.11 MIL/uL   Hemoglobin 12.1 12.0 - 15.0 g/dL   HCT 36.5 36.0 - 46.0 %   MCV 92.4 78.0 - 100.0 fL   MCH 30.6 26.0 - 34.0 pg   MCHC 33.2 30.0 - 36.0 g/dL   RDW 12.6 11.5 - 15.5 %   Platelets 340 150 - 400 K/uL   Neutrophils Relative % 90 %   Neutro Abs 11.7 (H) 1.7 - 7.7 K/uL   Lymphocytes Relative 5 %   Lymphs Abs 0.6 (L) 0.7 - 4.0 K/uL   Monocytes Relative 5 %   Monocytes Absolute 0.6 0.1 - 1.0 K/uL   Eosinophils Relative 0 %   Eosinophils Absolute 0.0 0.0 - 0.7 K/uL   Basophils Relative 0 %   Basophils Absolute 0.0 0.0 - 0.1 K/uL   Basic metabolic panel     Status: Abnormal   Collection Time: 02/25/16  5:31 AM  Result Value Ref Range   Sodium 131 (L) 135 - 145 mmol/L   Potassium 4.4 3.5 - 5.1 mmol/L   Chloride 90 (L) 101 - 111 mmol/L   CO2 34 (H) 22 - 32 mmol/L   Glucose, Bld 176 (H) 65 - 99 mg/dL   BUN 15 6 - 20 mg/dL   Creatinine, Ser 0.61 0.44 - 1.00 mg/dL   Calcium 9.3 8.9 - 10.3 mg/dL   GFR calc non Af Amer >60 >60 mL/min   GFR calc Af Amer >60 >60 mL/min    Comment: (NOTE) The eGFR has been calculated using the CKD EPI equation. This calculation has not been validated in all clinical situations. eGFR's persistently <60 mL/min signify possible Chronic Kidney Disease.    Anion gap 7 5 - 15    ABGS No results for input(s): PHART, PO2ART, TCO2, HCO3 in the last 72 hours.  Invalid input(s): PCO2 CULTURES No results found for this or any previous visit (from the past 240 hour(s)). Studies/Results: No results found.  Medications:  Prior to Admission:  Prescriptions Prior to Admission  Medication Sig Dispense Refill Last Dose  . albuterol (PROVENTIL HFA;VENTOLIN HFA) 108 (90 BASE) MCG/ACT inhaler  Inhale 2 puffs into the lungs every 6 (six) hours as needed for wheezing.   02/20/2016 at Unknown time  . albuterol (PROVENTIL) (2.5 MG/3ML) 0.083% nebulizer solution Take 2.5 mg by nebulization 3 (three) times daily.   02/19/2016 at Unknown time  . aspirin EC 81 MG tablet Take 81 mg by mouth daily.    02/19/2016 at Unknown time  . calcium carbonate (OS-CAL) 600 MG TABS Take 600 mg by mouth 2 (two) times daily with a meal.   02/19/2016 at Unknown time  . Cholecalciferol (VITAMIN D-3) 5000 UNITS TABS Take 1 tablet by mouth daily.   02/19/2016 at Unknown time  . dextromethorphan-guaiFENesin (MUCINEX DM) 30-600 MG per 12 hr tablet Take 1 tablet by mouth daily.    02/19/2016 at Unknown time  . Fluticasone-Salmeterol (ADVAIR) 250-50 MCG/DOSE AEPB Inhale 1 puff into the lungs every 12 (twelve) hours.   02/20/2016 at  Unknown time  . furosemide (LASIX) 40 MG tablet Take 40 mg by mouth daily as needed for fluid.    Past Week at Unknown time  . ipratropium (ATROVENT) 0.02 % nebulizer solution Take 500 mcg by nebulization 3 (three) times daily.   02/19/2016 at Unknown time  . pantoprazole (PROTONIX) 40 MG tablet Take 1 tablet (40 mg total) by mouth 2 (two) times daily. (Patient taking differently: Take 40 mg by mouth daily. ) 60 tablet 5 02/19/2016 at Unknown time   Scheduled: . aspirin EC  81 mg Oral Daily  . calcium carbonate  1,250 mg Oral BID WC  . cholecalciferol  5,000 Units Oral Daily  . dextromethorphan-guaiFENesin  1 tablet Oral Daily  . feeding supplement  1 Container Oral TID BM  . fluticasone furoate-vilanterol  1 puff Inhalation Daily  . guaiFENesin  1,200 mg Oral BID  . heparin  5,000 Units Subcutaneous Q8H  . ipratropium-albuterol  3 mL Nebulization Q4H WA  . levofloxacin  500 mg Oral Daily  . pantoprazole  40 mg Oral BID  . predniSONE  40 mg Oral Q breakfast  . sodium chloride flush  3 mL Intravenous Q12H   Continuous:  [id number], furosemide  Assesment: She has COPD with acute exacerbation. She is improving. She has chronic hypoxic respiratory failure on home oxygen. At baseline she has esophagitis and hemochromatosis which are stable. Principal Problem:   COPD with acute exacerbation ([place]) Active Problems:   Reflux esophagitis   Hemochromatosis   COPD exacerbation ([place])   Acute hypokalemia    Plan: Home with home health services    LOS: 5 days   Jalon Blackwelder L 02/26/2016, 10:28 AM

## 2016-02-26 NOTE — Discharge Summary (Signed)
Physician Discharge Summary  Patient ID: Leah Dalton MRN: [id number] DOB/AGE: [birth year] 70 y.o. Primary Care Physician:Aizlyn Schifano L, MD Admit date: 02/20/2016 Discharge date: 02/26/2016    Discharge Diagnoses:   Principal Problem:   COPD with acute exacerbation ([place]) Active Problems:   Acute-on-chronic respiratory failure (HCC)   Reflux esophagitis   Hemochromatosis   COPD exacerbation (H[facility])   Acute hypokalemia     Medication List    STOP taking these medications   dextromethorphan-guaiFENesin 30-600 MG 12hr tablet Commonly known as:  [place] DM     TAKE these medications   albuterol 108 (90 Base) MCG/ACT inhaler Commonly known as:  PROVENTIL HFA;VENTOLIN HFA Inhale 2 puffs into the lungs every 6 (six) hours as needed for wheezing.   albuterol (2.5 MG/3ML) 0.083% nebulizer solution Commonly known as:  PROVENTIL Take 2.5 mg by nebulization 3 (three) times daily.   aspirin EC 81 MG tablet Take 81 mg by mouth daily.   calcium carbonate 600 MG Tabs tablet Commonly known as:  OS-CAL Take 600 mg by mouth 2 (two) times daily with a meal.   Fluticasone-Salmeterol 250-50 MCG/DOSE Aepb Commonly known as:  ADVAIR Inhale 1 puff into the lungs every 12 (twelve) hours.   furosemide 40 MG tablet Commonly known as:  LASIX Take 40 mg by mouth daily as needed for fluid.   guaiFENesin 600 MG 12 hr tablet Commonly known as:  MUCINEX Take 2 tablets (1,200 mg total) by mouth 2 (two) times daily.   ipratropium 0.02 % nebulizer solution Commonly known as:  ATROVENT Take 500 mcg by nebulization 3 (three) times daily.   levofloxacin 500 MG tablet Commonly known as:  LEVAQUIN Take 1 tablet (500 mg total) by mouth daily. Start taking on:  02/27/2016   pantoprazole 40 MG tablet Commonly known as:  PROTONIX Take 1 tablet (40 mg total) by mouth 2 (two) times daily. What changed:  when to take this   predniSONE 10 MG (21) Tbpk tablet Commonly known as:  STERAPRED  UNI-PAK 21 TAB Take 1 tablet (10 mg total) by mouth daily.   Vitamin D-3 5000 units Tabs Take 1 tablet by mouth daily.       Discharged Condition:Improved    Consults: None  Significant Diagnostic Studies: Dg Chest 2 View  Result Date: 02/20/2016 CLINICAL DATA:  Shortness of breath starting last night, history of bronchitis EXAM: CHEST  2 VIEW COMPARISON:  03/25/2015 FINDINGS: Cardiomediastinal silhouette is stable. Hyperinflation again noted. No acute infiltrate or pleural effusion. No pulmonary edema. Osteopenia and mild degenerative changes thoracic spine. Stable minimal compression deformity lower thoracic spine. IMPRESSION: No active cardiopulmonary disease.  Hyperinflation again noted. Electronically Signed   By: Lahoma Crocker M.D.   On: 02/20/2016 10:08    Lab Results: Basic Metabolic Panel:  Recent Labs  02/25/16 0531  NA 131*  K 4.4  CL 90*  CO2 34*  GLUCOSE 176*  BUN 15  CREATININE 0.61  CALCIUM 9.3   Liver Function Tests: No results for input(s): AST, ALT, ALKPHOS, BILITOT, PROT, ALBUMIN in the last 72 hours.   CBC:  Recent Labs  02/25/16 0531  WBC 12.9*  NEUTROABS 11.7*  HGB 12.1  HCT 36.5  MCV 92.4  PLT 340    No results found for this or any previous visit (from the past 240 hour(s)).   Hospital Course: This is a 70 year old with known severe COPD. She was in her usual state of health but over the last 4-6 weeks has had increasing  cough congestion shortness of breath. She's been treated as an outpatient but failed. She came to the emergency room with increasing problems. She was started on IV antibiotics and IV steroids inhaled bronchodilators. She slowly improved. She is not back at baseline but she is approaching baseline now. She continues on oxygen. Her chest is clearer she has less dyspnea and she was able to switch to oral medications on the day prior to discharge  Discharge Exam: Blood pressure (!) 150/77, pulse 88, temperature 98.7 F  (37.1 C), temperature source Oral, resp. rate 20, height 5\' 4"  (1.626 m), weight 73 kg (160 lb 14.4 oz), SpO2 97 %. She is awake and alert. She still has some rhonchi. Heart is regular. She has chronic venous stasis changes  Disposition: Home with home health services  Discharge Instructions    Discharge patient    Complete by:  As directed    Face-to-face encounter (required for Medicare/Medicaid patients)    Complete by:  As directed    I Spero Gunnels L certify that this patient is under my care and that I, or a nurse practitioner or physician's assistant working with me, had a face-to-face encounter that meets the physician face-to-face encounter requirements with this patient on 02/26/2016. The encounter with the patient was in whole, or in part for the following medical condition(s) which is the primary reason for home health care (List medical condition): copd exacerbation   The encounter with the patient was in whole, or in part, for the following medical condition, which is the primary reason for home health care:  copd exacerbation   I certify that, based on my findings, the following services are medically necessary home health services:  Nursing   Reason for Medically Necessary Home Health Services:  Skilled Nursing- Change/Decline in Patient Status   My clinical findings support the need for the above services:  Shortness of breath with activity   Further, I certify that my clinical findings support that this patient is homebound due to:  Shortness of Breath with activity   Home Health    Complete by:  As directed    To provide the following care/treatments:  RN   May increase  o2 to 3 liters as needed        Signed: Ellis Koffler L   02/26/2016, 10:32 AM

## 2016-02-26 NOTE — Progress Notes (Addendum)
CM sent information for review to [facility] to start services. Called number on file and was not able to talk with anyone regarding patient d/c today [phone].  [facility] HH# [phone]

## 2016-02-28 DIAGNOSIS — Q2739 Arteriovenous malformation, other site: Secondary | ICD-10-CM | POA: Diagnosis not present

## 2016-02-28 DIAGNOSIS — J449 Chronic obstructive pulmonary disease, unspecified: Secondary | ICD-10-CM | POA: Diagnosis not present

## 2016-02-28 DIAGNOSIS — K210 Gastro-esophageal reflux disease with esophagitis: Secondary | ICD-10-CM | POA: Diagnosis not present

## 2016-02-29 DIAGNOSIS — G4733 Obstructive sleep apnea (adult) (pediatric): Secondary | ICD-10-CM | POA: Diagnosis not present

## 2016-02-29 DIAGNOSIS — J9611 Chronic respiratory failure with hypoxia: Secondary | ICD-10-CM | POA: Diagnosis not present

## 2016-02-29 DIAGNOSIS — J441 Chronic obstructive pulmonary disease with (acute) exacerbation: Secondary | ICD-10-CM | POA: Diagnosis not present

## 2016-03-02 DIAGNOSIS — Q2739 Arteriovenous malformation, other site: Secondary | ICD-10-CM | POA: Diagnosis not present

## 2016-03-02 DIAGNOSIS — K210 Gastro-esophageal reflux disease with esophagitis: Secondary | ICD-10-CM | POA: Diagnosis not present

## 2016-03-02 DIAGNOSIS — J449 Chronic obstructive pulmonary disease, unspecified: Secondary | ICD-10-CM | POA: Diagnosis not present

## 2016-03-03 ENCOUNTER — Other Ambulatory Visit: Payer: Self-pay

## 2016-03-03 DIAGNOSIS — Q2739 Arteriovenous malformation, other site: Secondary | ICD-10-CM | POA: Diagnosis not present

## 2016-03-03 DIAGNOSIS — K210 Gastro-esophageal reflux disease with esophagitis: Secondary | ICD-10-CM | POA: Diagnosis not present

## 2016-03-03 DIAGNOSIS — D509 Iron deficiency anemia, unspecified: Secondary | ICD-10-CM

## 2016-03-03 DIAGNOSIS — J449 Chronic obstructive pulmonary disease, unspecified: Secondary | ICD-10-CM | POA: Diagnosis not present

## 2016-03-07 DIAGNOSIS — Q2739 Arteriovenous malformation, other site: Secondary | ICD-10-CM | POA: Diagnosis not present

## 2016-03-07 DIAGNOSIS — J449 Chronic obstructive pulmonary disease, unspecified: Secondary | ICD-10-CM | POA: Diagnosis not present

## 2016-03-07 DIAGNOSIS — K210 Gastro-esophageal reflux disease with esophagitis: Secondary | ICD-10-CM | POA: Diagnosis not present

## 2016-03-09 ENCOUNTER — Inpatient Hospital Stay (HOSPITAL_COMMUNITY)
Admission: AD | Admit: 2016-03-09 | Discharge: 2016-03-15 | DRG: 191 | Disposition: A | Discharge status: Skilled Nursing Facility | Payer: Medicare Other | Source: Ambulatory Visit | Attending: Pulmonary Disease | Admitting: Pulmonary Disease

## 2016-03-09 ENCOUNTER — Other Ambulatory Visit (HOSPITAL_COMMUNITY): Payer: Self-pay | Admitting: Pulmonary Disease

## 2016-03-09 ENCOUNTER — Encounter (HOSPITAL_COMMUNITY): Payer: Self-pay | Admitting: *Deleted

## 2016-03-09 ENCOUNTER — Ambulatory Visit (HOSPITAL_COMMUNITY)
Admission: RE | Admit: 2016-03-09 | Discharge: 2016-03-09 | Disposition: A | Discharge status: Home / Self Care | Payer: Medicare Other | Source: Ambulatory Visit | Attending: Pulmonary Disease | Admitting: Pulmonary Disease

## 2016-03-09 DIAGNOSIS — D509 Iron deficiency anemia, unspecified: Secondary | ICD-10-CM | POA: Diagnosis not present

## 2016-03-09 DIAGNOSIS — J9621 Acute and chronic respiratory failure with hypoxia: Secondary | ICD-10-CM

## 2016-03-09 DIAGNOSIS — R0602 Shortness of breath: Secondary | ICD-10-CM

## 2016-03-09 DIAGNOSIS — J441 Chronic obstructive pulmonary disease with (acute) exacerbation: Secondary | ICD-10-CM | POA: Diagnosis present

## 2016-03-09 DIAGNOSIS — K219 Gastro-esophageal reflux disease without esophagitis: Secondary | ICD-10-CM | POA: Diagnosis not present

## 2016-03-09 DIAGNOSIS — D751 Secondary polycythemia: Secondary | ICD-10-CM | POA: Diagnosis present

## 2016-03-09 DIAGNOSIS — Z79899 Other long term (current) drug therapy: Secondary | ICD-10-CM | POA: Diagnosis not present

## 2016-03-09 DIAGNOSIS — Z87891 Personal history of nicotine dependence: Secondary | ICD-10-CM

## 2016-03-09 DIAGNOSIS — Z9981 Dependence on supplemental oxygen: Secondary | ICD-10-CM | POA: Diagnosis not present

## 2016-03-09 DIAGNOSIS — R062 Wheezing: Secondary | ICD-10-CM

## 2016-03-09 DIAGNOSIS — Z885 Allergy status to narcotic agent status: Secondary | ICD-10-CM

## 2016-03-09 DIAGNOSIS — R2681 Unsteadiness on feet: Secondary | ICD-10-CM | POA: Diagnosis not present

## 2016-03-09 DIAGNOSIS — R1319 Other dysphagia: Secondary | ICD-10-CM | POA: Diagnosis not present

## 2016-03-09 DIAGNOSIS — Z741 Need for assistance with personal care: Secondary | ICD-10-CM | POA: Diagnosis not present

## 2016-03-09 DIAGNOSIS — E871 Hypo-osmolality and hyponatremia: Secondary | ICD-10-CM | POA: Diagnosis present

## 2016-03-09 DIAGNOSIS — E876 Hypokalemia: Secondary | ICD-10-CM | POA: Diagnosis present

## 2016-03-09 DIAGNOSIS — R600 Localized edema: Secondary | ICD-10-CM | POA: Diagnosis present

## 2016-03-09 DIAGNOSIS — E870 Hyperosmolality and hypernatremia: Secondary | ICD-10-CM | POA: Diagnosis not present

## 2016-03-09 DIAGNOSIS — Z7982 Long term (current) use of aspirin: Secondary | ICD-10-CM

## 2016-03-09 DIAGNOSIS — Z880 Allergy status to penicillin: Secondary | ICD-10-CM

## 2016-03-09 DIAGNOSIS — M810 Age-related osteoporosis without current pathological fracture: Secondary | ICD-10-CM | POA: Diagnosis present

## 2016-03-09 DIAGNOSIS — J9611 Chronic respiratory failure with hypoxia: Secondary | ICD-10-CM | POA: Diagnosis present

## 2016-03-09 DIAGNOSIS — M6281 Muscle weakness (generalized): Secondary | ICD-10-CM | POA: Diagnosis not present

## 2016-03-09 DIAGNOSIS — I10 Essential (primary) hypertension: Secondary | ICD-10-CM | POA: Diagnosis not present

## 2016-03-09 DIAGNOSIS — F339 Major depressive disorder, recurrent, unspecified: Secondary | ICD-10-CM | POA: Diagnosis not present

## 2016-03-09 DIAGNOSIS — J9620 Acute and chronic respiratory failure, unspecified whether with hypoxia or hypercapnia: Secondary | ICD-10-CM | POA: Diagnosis present

## 2016-03-09 DIAGNOSIS — K2100 Gastro-esophageal reflux disease with esophagitis, without bleeding: Secondary | ICD-10-CM | POA: Diagnosis present

## 2016-03-09 DIAGNOSIS — R531 Weakness: Secondary | ICD-10-CM | POA: Diagnosis not present

## 2016-03-09 DIAGNOSIS — R21 Rash and other nonspecific skin eruption: Secondary | ICD-10-CM | POA: Diagnosis present

## 2016-03-09 LAB — CBC WITH DIFFERENTIAL/PLATELET
BASOS ABS: 0 10*3/uL (ref 0.0–0.1)
BASOS PCT: 0 %
Eosinophils Absolute: 0.3 10*3/uL (ref 0.0–0.7)
Eosinophils Relative: 3 %
HEMATOCRIT: 33.7 % — AB (ref 36.0–46.0)
Hemoglobin: 11.6 g/dL — ABNORMAL LOW (ref 12.0–15.0)
LYMPHS PCT: 10 %
Lymphs Abs: 1 10*3/uL (ref 0.7–4.0)
MCH: 31.4 pg (ref 26.0–34.0)
MCHC: 34.4 g/dL (ref 30.0–36.0)
MCV: 91.3 fL (ref 78.0–100.0)
Monocytes Absolute: 1 10*3/uL (ref 0.1–1.0)
Monocytes Relative: 9 %
NEUTROS ABS: 8.3 10*3/uL — AB (ref 1.7–7.7)
Neutrophils Relative %: 78 %
PLATELETS: 274 10*3/uL (ref 150–400)
RBC: 3.69 MIL/uL — AB (ref 3.87–5.11)
RDW: 13.2 % (ref 11.5–15.5)
WBC: 10.7 10*3/uL — AB (ref 4.0–10.5)

## 2016-03-09 LAB — COMPREHENSIVE METABOLIC PANEL
ALBUMIN: 3 g/dL — AB (ref 3.5–5.0)
ALT: 18 U/L (ref 14–54)
ANION GAP: 14 (ref 5–15)
AST: 18 U/L (ref 15–41)
Alkaline Phosphatase: 66 U/L (ref 38–126)
BILIRUBIN TOTAL: 1.1 mg/dL (ref 0.3–1.2)
BUN: 8 mg/dL (ref 6–20)
CO2: 34 mmol/L — ABNORMAL HIGH (ref 22–32)
Calcium: 8.7 mg/dL — ABNORMAL LOW (ref 8.9–10.3)
Chloride: 75 mmol/L — ABNORMAL LOW (ref 101–111)
Creatinine, Ser: 0.44 mg/dL (ref 0.44–1.00)
Glucose, Bld: 106 mg/dL — ABNORMAL HIGH (ref 65–99)
POTASSIUM: 2.4 mmol/L — AB (ref 3.5–5.1)
Sodium: 123 mmol/L — ABNORMAL LOW (ref 135–145)
TOTAL PROTEIN: 5.8 g/dL — AB (ref 6.5–8.1)

## 2016-03-09 LAB — BRAIN NATRIURETIC PEPTIDE: B NATRIURETIC PEPTIDE 5: 43 pg/mL (ref 0.0–100.0)

## 2016-03-09 LAB — TSH: TSH: 1.07 u[IU]/mL (ref 0.350–4.500)

## 2016-03-09 MED ORDER — SODIUM CHLORIDE 0.9% FLUSH
3.0000 mL | Freq: Two times a day (BID) | INTRAVENOUS | Status: DC
  Administered 2016-03-10 – 2016-03-15 (×9): 3 mL via INTRAVENOUS

## 2016-03-09 MED ORDER — METHYLPREDNISOLONE SODIUM SUCC 40 MG IJ SOLR
40.0000 mg | Freq: Two times a day (BID) | INTRAMUSCULAR | Status: DC
  Administered 2016-03-09 – 2016-03-14 (×9): 40 mg via INTRAVENOUS
  Filled 2016-03-09 (×10): qty 1

## 2016-03-09 MED ORDER — POTASSIUM CHLORIDE 10 MEQ/100ML IV SOLN
10.0000 meq | INTRAVENOUS | Status: AC
  Administered 2016-03-09 – 2016-03-10 (×6): 10 meq via INTRAVENOUS
  Filled 2016-03-09 (×6): qty 100

## 2016-03-09 MED ORDER — ASPIRIN EC 81 MG PO TBEC
81.0000 mg | DELAYED_RELEASE_TABLET | Freq: Every day | ORAL | Status: DC
  Administered 2016-03-10 – 2016-03-15 (×6): 81 mg via ORAL
  Filled 2016-03-09 (×7): qty 1

## 2016-03-09 MED ORDER — SODIUM CHLORIDE 0.9% FLUSH
3.0000 mL | Freq: Two times a day (BID) | INTRAVENOUS | Status: DC
  Administered 2016-03-09: 3 mL via INTRAVENOUS

## 2016-03-09 MED ORDER — ONDANSETRON HCL 4 MG PO TABS: 4.0000 mg | ORAL_TABLET | Freq: Four times a day (QID) | ORAL | Status: DC | PRN

## 2016-03-09 MED ORDER — ENOXAPARIN SODIUM 40 MG/0.4ML ~~LOC~~ SOLN
40.0000 mg | SUBCUTANEOUS | Status: DC
  Administered 2016-03-09 – 2016-03-14 (×6): 40 mg via SUBCUTANEOUS
  Filled 2016-03-09 (×6): qty 0.4

## 2016-03-09 MED ORDER — LEVOFLOXACIN IN D5W 750 MG/150ML IV SOLN
750.0000 mg | INTRAVENOUS | Status: DC
  Administered 2016-03-09 – 2016-03-13 (×5): 750 mg via INTRAVENOUS
  Filled 2016-03-09 (×4): qty 150

## 2016-03-09 MED ORDER — ONDANSETRON HCL 4 MG/2ML IJ SOLN
4.0000 mg | Freq: Four times a day (QID) | INTRAMUSCULAR | Status: DC | PRN
  Administered 2016-03-11: 4 mg via INTRAVENOUS
  Filled 2016-03-09: qty 2

## 2016-03-09 MED ORDER — FLUTICASONE FUROATE-VILANTEROL 200-25 MCG/INH IN AEPB
1.0000 | INHALATION_SPRAY | Freq: Every day | RESPIRATORY_TRACT | Status: DC
  Administered 2016-03-10 – 2016-03-15 (×6): 1 via RESPIRATORY_TRACT
  Filled 2016-03-09: qty 28

## 2016-03-09 MED ORDER — IPRATROPIUM-ALBUTEROL 0.5-2.5 (3) MG/3ML IN SOLN
3.0000 mL | RESPIRATORY_TRACT | Status: DC
  Administered 2016-03-09 – 2016-03-15 (×30): 3 mL via RESPIRATORY_TRACT
  Filled 2016-03-09 (×30): qty 3

## 2016-03-09 MED ORDER — ACETAMINOPHEN 325 MG PO TABS
650.0000 mg | ORAL_TABLET | Freq: Four times a day (QID) | ORAL | Status: DC | PRN
  Administered 2016-03-11 – 2016-03-12 (×2): 650 mg via ORAL
  Filled 2016-03-09 (×3): qty 2

## 2016-03-09 MED ORDER — SODIUM CHLORIDE 0.9% FLUSH: 3.0000 mL | INTRAVENOUS | Status: DC | PRN

## 2016-03-09 MED ORDER — SODIUM CHLORIDE 0.9 % IV SOLN: 250.0000 mL | INTRAVENOUS | Status: DC | PRN

## 2016-03-09 MED ORDER — GUAIFENESIN ER 600 MG PO TB12
1200.0000 mg | ORAL_TABLET | Freq: Two times a day (BID) | ORAL | Status: DC
  Administered 2016-03-09 – 2016-03-15 (×12): 1200 mg via ORAL
  Filled 2016-03-09 (×12): qty 2

## 2016-03-09 MED ORDER — TRAMADOL HCL 50 MG PO TABS
50.0000 mg | ORAL_TABLET | Freq: Four times a day (QID) | ORAL | Status: DC | PRN
Start: 2016-03-09 — End: 2016-03-15

## 2016-03-09 MED ORDER — PANTOPRAZOLE SODIUM 40 MG PO TBEC
40.0000 mg | DELAYED_RELEASE_TABLET | Freq: Every day | ORAL | Status: DC
  Administered 2016-03-10 – 2016-03-15 (×6): 40 mg via ORAL
  Filled 2016-03-09 (×7): qty 1

## 2016-03-09 MED ORDER — ACETAMINOPHEN 650 MG RE SUPP: 650.0000 mg | Freq: Four times a day (QID) | RECTAL | Status: DC | PRN

## 2016-03-09 NOTE — Progress Notes (Signed)
Pharmacy Antibiotic Note  Leah Dalton is a 70 y.o. female admitted on 03/09/2016 with shortness of breath.  Pharmacy has been consulted for Levaquin dosing.  Recent Levaquin as an outpatient but has not received dose today.  "Out of that" per patient.  Plan: Levaquin 750mg  IV every 24 hours. Monitor labs, micro and vitals.   Height: 5\' 4"  (162.6 cm) Weight: 157 lb (71.2 kg) IBW/kg (Calculated) : 54.7  Temp (24hrs), Avg:97.7 F (36.5 C), Min:97.7 F (36.5 C), Max:97.7 F (36.5 C)  No results for input(s): WBC, CREATININE, LATICACIDVEN, VANCOTROUGH, VANCOPEAK, VANCORANDOM, GENTTROUGH, GENTPEAK, GENTRANDOM, TOBRATROUGH, TOBRAPEAK, TOBRARND, AMIKACINPEAK, AMIKACINTROU, AMIKACIN in the last 168 hours.  Estimated Creatinine Clearance: 63.3 mL/min (by C-G formula based on SCr of 0.61 mg/dL).    Allergies  Allergen Reactions  . Codeine Itching  . Daliresp [Roflumilast] Diarrhea  . Keflex [Cephalexin] Other (See Comments)    Gives pt yeast infection  . Penicillins Itching   Antimicrobials this admission: Levaquin 12/17 >>   Dose adjustments this admission: n/a   Microbiology results:  Thank you for allowing pharmacy to be a part of this patient's care.  Pricilla Larsson 03/09/2016 6:45 PM

## 2016-03-10 LAB — BASIC METABOLIC PANEL
Anion gap: 13 (ref 5–15)
BUN: 7 mg/dL (ref 6–20)
CO2: 32 mmol/L (ref 22–32)
CREATININE: 0.39 mg/dL — AB (ref 0.44–1.00)
Calcium: 8.7 mg/dL — ABNORMAL LOW (ref 8.9–10.3)
Chloride: 79 mmol/L — ABNORMAL LOW (ref 101–111)
GFR calc non Af Amer: >60 mL/min (ref >60)
Glucose, Bld: 157 mg/dL — ABNORMAL HIGH (ref 65–99)
Potassium: 3.1 mmol/L — ABNORMAL LOW (ref 3.5–5.1)
SODIUM: 124 mmol/L — AB (ref 135–145)

## 2016-03-10 MED ORDER — POTASSIUM CHLORIDE CRYS ER 20 MEQ PO TBCR
20.0000 meq | EXTENDED_RELEASE_TABLET | Freq: Two times a day (BID) | ORAL | Status: DC
  Administered 2016-03-10: 20 meq via ORAL
  Filled 2016-03-10: qty 1

## 2016-03-10 MED ORDER — SERTRALINE HCL 50 MG PO TABS
50.0000 mg | ORAL_TABLET | Freq: Every day | ORAL | Status: DC
  Administered 2016-03-10 – 2016-03-15 (×6): 50 mg via ORAL
  Filled 2016-03-10 (×6): qty 1

## 2016-03-10 MED ORDER — SODIUM CHLORIDE 0.9 % IV SOLN
INTRAVENOUS | Status: DC
  Administered 2016-03-10 – 2016-03-12 (×3): via INTRAVENOUS

## 2016-03-10 MED ORDER — POTASSIUM CHLORIDE 20 MEQ PO PACK
20.0000 meq | PACK | Freq: Two times a day (BID) | ORAL | Status: DC
  Administered 2016-03-10 – 2016-03-15 (×11): 20 meq via ORAL
  Filled 2016-03-10 (×11): qty 1

## 2016-03-10 NOTE — Progress Notes (Signed)
Subjective: She feels better than she did yesterday. She was found to be hypokalemic and hyponatremic. Her breathing is a little bit better. She's not having any chest pain. She is very weak.  Objective: Vital signs in last 24 hours: Temp:  [97.4 F (36.3 C)-98.4 F (36.9 C)] 97.4 F (36.3 C) (12/15 0515) Pulse Rate:  [85-94] 87 (12/15 0515) Resp:  [15-20] 15 (12/15 0515) BP: (130-159)/(72-81) 135/81 (12/15 0515) SpO2:  [95 %-98 %] 97 % (12/15 0755) Weight:  [71.2 kg (157 lb)] 71.2 kg (157 lb) (12/14 1645) Weight change:  Last BM Date: 03/09/16  Intake/Output from previous day: 12/14 0701 - 12/15 0700 In: 990 [P.O.:240; IV Piggyback:750] Out: -   PHYSICAL EXAM General appearance: alert, cooperative, mild distress and Chronically sick in appearance Resp: rhonchi bilaterally Cardio: regular rate and rhythm, S1, S2 normal, no murmur, click, rub or gallop GI: soft, non-tender; bowel sounds normal; no masses,  no organomegaly Extremities: extremities normal, atraumatic, no cyanosis or edema Skin warm and dry still tents easily. Mucous membranes are dry. Pupils react  Lab Results:  Results for orders placed or performed during the hospital encounter of 03/09/16 (from the past 48 hour(s))  Comprehensive metabolic panel     Status: Abnormal   Collection Time: 03/09/16  6:17 PM  Result Value Ref Range   Sodium 123 (L) 135 - 145 mmol/L   Potassium 2.4 (LL) 3.5 - 5.1 mmol/L    Comment: CRITICAL RESULT CALLED TO, READ BACK BY AND VERIFIED WITH: BULLIK,T AT 2000 ON 03/09/2016 BY ISLEY,B    Chloride 75 (L) 101 - 111 mmol/L   CO2 34 (H) 22 - 32 mmol/L   Glucose, Bld 106 (H) 65 - 99 mg/dL   BUN 8 6 - 20 mg/dL   Creatinine, Ser 0.44 0.44 - 1.00 mg/dL   Calcium 8.7 (L) 8.9 - 10.3 mg/dL   Total Protein 5.8 (L) 6.5 - 8.1 g/dL   Albumin 3.0 (L) 3.5 - 5.0 g/dL   AST 18 15 - 41 U/L   ALT 18 14 - 54 U/L   Alkaline Phosphatase 66 38 - 126 U/L   Total Bilirubin 1.1 0.3 - 1.2 mg/dL   GFR  calc non Af Amer >60 >60 mL/min   GFR calc Af Amer >60 >60 mL/min    Comment: (NOTE) The eGFR has been calculated using the CKD EPI equation. This calculation has not been validated in all clinical situations. eGFR's persistently <60 mL/min signify possible Chronic Kidney Disease.    Anion gap 14 5 - 15  CBC WITH DIFFERENTIAL     Status: Abnormal   Collection Time: 03/09/16  6:17 PM  Result Value Ref Range   WBC 10.7 (H) 4.0 - 10.5 K/uL   RBC 3.69 (L) 3.87 - 5.11 MIL/uL   Hemoglobin 11.6 (L) 12.0 - 15.0 g/dL   HCT 33.7 (L) 36.0 - 46.0 %   MCV 91.3 78.0 - 100.0 fL   MCH 31.4 26.0 - 34.0 pg   MCHC 34.4 30.0 - 36.0 g/dL   RDW 13.2 11.5 - 15.5 %   Platelets 274 150 - 400 K/uL   Neutrophils Relative % 78 %   Neutro Abs 8.3 (H) 1.7 - 7.7 K/uL   Lymphocytes Relative 10 %   Lymphs Abs 1.0 0.7 - 4.0 K/uL   Monocytes Relative 9 %   Monocytes Absolute 1.0 0.1 - 1.0 K/uL   Eosinophils Relative 3 %   Eosinophils Absolute 0.3 0.0 - 0.7 K/uL  Basophils Relative 0 %   Basophils Absolute 0.0 0.0 - 0.1 K/uL  TSH     Status: None   Collection Time: 03/09/16  6:24 PM  Result Value Ref Range   TSH 1.070 0.350 - 4.500 uIU/mL    Comment: Performed by a 3rd Generation assay with a functional sensitivity of <=0.01 uIU/mL.  Brain natriuretic peptide     Status: None   Collection Time: 03/09/16  6:24 PM  Result Value Ref Range   B Natriuretic Peptide 43.0 0.0 - 100.0 pg/mL  Basic metabolic panel     Status: Abnormal   Collection Time: 03/10/16  4:37 AM  Result Value Ref Range   Sodium 124 (L) 135 - 145 mmol/L   Potassium 3.1 (L) 3.5 - 5.1 mmol/L    Comment: DELTA CHECK NOTED   Chloride 79 (L) 101 - 111 mmol/L   CO2 32 22 - 32 mmol/L   Glucose, Bld 157 (H) 65 - 99 mg/dL   BUN 7 6 - 20 mg/dL   Creatinine, Ser 0.39 (L) 0.44 - 1.00 mg/dL   Calcium 8.7 (L) 8.9 - 10.3 mg/dL   GFR calc non Af Amer >60 >60 mL/min   GFR calc Af Amer >60 >60 mL/min    Comment: (NOTE) The eGFR has been calculated  using the CKD EPI equation. This calculation has not been validated in all clinical situations. eGFR's persistently <60 mL/min signify possible Chronic Kidney Disease.    Anion gap 13 5 - 15    ABGS No results for input(s): PHART, PO2ART, TCO2, HCO3 in the last 72 hours.  Invalid input(s): PCO2 CULTURES No results found for this or any previous visit (from the past 240 hour(s)). Studies/Results: Dg Chest 2 View  Result Date: 03/09/2016 CLINICAL DATA:  Shortness of breath, wheezing EXAM: CHEST  2 VIEW COMPARISON:  02/20/2016 FINDINGS: The heart size and mediastinal contours are within normal limits. Both lungs are clear. The visualized skeletal structures are unremarkable. Hyperinflation again noted. IMPRESSION: No active cardiopulmonary disease.  Hyperinflation again noted. Electronically Signed   By: Lahoma Crocker M.D.   On: 03/09/2016 14:54    Medications:  Prior to Admission:  Prescriptions Prior to Admission  Medication Sig Dispense Refill Last Dose  . albuterol (PROVENTIL HFA;VENTOLIN HFA) 108 (90 BASE) MCG/ACT inhaler Inhale 2 puffs into the lungs every 6 (six) hours as needed for wheezing.   03/09/2016 at Unknown time  . albuterol (PROVENTIL) (2.5 MG/3ML) 0.083% nebulizer solution Take 2.5 mg by nebulization 3 (three) times daily.   03/09/2016 at Unknown time  . aspirin EC 81 MG tablet Take 81 mg by mouth daily.    03/09/2016 at Unknown time  . calcium carbonate (OS-CAL) 1250 (500 Ca) MG chewable tablet Chew 1 tablet by mouth daily.   03/09/2016 at Unknown time  . Cholecalciferol (VITAMIN D-3) 5000 UNITS TABS Take 1 tablet by mouth daily.   03/09/2016 at Unknown time  . fluticasone furoate-vilanterol (BREO ELLIPTA) 200-25 MCG/INH AEPB Inhale 1 puff into the lungs daily.   03/09/2016 at Unknown time  . furosemide (LASIX) 40 MG tablet Take 40 mg by mouth daily as needed for fluid.    03/08/2016 at Unknown time  . guaiFENesin (MUCINEX) 600 MG 12 hr tablet Take 2 tablets (1,200 mg  total) by mouth 2 (two) times daily. 60 tablet 5 03/09/2016 at morning  . ipratropium (ATROVENT) 0.02 % nebulizer solution Take 500 mcg by nebulization 3 (three) times daily.   03/09/2016 at Unknown time  .  pantoprazole (PROTONIX) 40 MG tablet Take 1 tablet (40 mg total) by mouth 2 (two) times daily. (Patient taking differently: Take 40 mg by mouth daily. ) 60 tablet 5 03/09/2016 at Unknown time  . Fluticasone-Salmeterol (ADVAIR) 250-50 MCG/DOSE AEPB Inhale 1 puff into the lungs every 12 (twelve) hours.   HOLD  . levofloxacin (LEVAQUIN) 500 MG tablet Take 1 tablet (500 mg total) by mouth daily. 5 tablet 1   . predniSONE (STERAPRED UNI-PAK 21 TAB) 10 MG (21) TBPK tablet Take 1 tablet (10 mg total) by mouth daily. (Patient not taking: Reported on 03/09/2016) 21 tablet 1 Not Taking at Unknown time   Scheduled: . aspirin EC  81 mg Oral Daily  . enoxaparin (LOVENOX) injection  40 mg Subcutaneous Q24H  . fluticasone furoate-vilanterol  1 puff Inhalation Daily  . guaiFENesin  1,200 mg Oral BID  . ipratropium-albuterol  3 mL Nebulization Q4H  . levofloxacin (LEVAQUIN) IV  750 mg Intravenous Q24H  . methylPREDNISolone (SOLU-MEDROL) injection  40 mg Intravenous Q12H  . pantoprazole  40 mg Oral Daily  . potassium chloride  20 mEq Oral BID  . sodium chloride flush  3 mL Intravenous Q12H  . sodium chloride flush  3 mL Intravenous Q12H   Continuous: . sodium chloride     JOA:CZYSAY chloride, acetaminophen **OR** acetaminophen, ondansetron **OR** ondansetron (ZOFRAN) IV, sodium chloride flush, traMADol  Assesment: She was admitted with acute on chronic respiratory failure, COPD exacerbation hypokalemia and hyponatremia. She's a little bit better. I think she's also dehydrated. Active Problems:   Acute-on-chronic respiratory failure (HCC)   Polycythemia   Reflux esophagitis   Hemochromatosis   COPD exacerbation ([facility])   Acute hypokalemia    Plan: Continue potassium replacement. Continue IV  antibiotics and steroids. Her daughter-in-law came out of the room to discuss her situation with me after my visit and she says that Ms. Mandala does not do anything for herself. Her family is concerned that this will happen again. She may be depressed. I'm going to add an antidepressant. I'm going to ask for PT consultation to see if perhaps she needs rehabilitation.    LOS: 1 day   Riki Gehring L 03/10/2016, 8:58 AM

## 2016-03-10 NOTE — Evaluation (Signed)
Physical Therapy Evaluation Patient Details Name: Leah Dalton MRN: [id number] DOB: 01-May-1945 Today's Date: 03/10/2016   History of Present Illness   70 yo F admitted recently with COPD exacerbation seemed to be having more trouble. I had seen her about a week ago after she was discharged from hospital and things seem to be getting better. However since then she says that she has continuously deteriorated. She is short of breath with minimal exertion. She's coughing and congested. She is very weak. Her cough is mostly nonproductive. She's not had any chest pain. She has some swelling of her legs. She feels like she has some cardiac arrhythmia. She has finished her antibiotics and prednisone. She has been using her oxygen regularly.  Dx: COPD exacerbation  Clinical Impression  Pt received in bed, and was agreeable to PT evaluation.  Pt expressed that she was not using any type of DME at home, however home health was going to get her a RW.  She has an aide that comes out to help her with bathing, and she uses O2 at home.  During PT evaluation, she ambulated 11ft with RW and Min guard, which is different from her last evaluation on 02/23/2016 where she was able to ambulate 74ft independently.  At this point, she is recommended to d/c home with HHPT, and a RW.      Follow Up Recommendations Home health PT    Equipment Recommendations  Rolling walker with 5" wheels    Recommendations for Other Services       Precautions / Restrictions Precautions Precautions: None Restrictions Weight Bearing Restrictions: No      Mobility  Bed Mobility Overal bed mobility: Modified Independent             General bed mobility comments: HOB raised  Transfers Overall transfer level: Needs assistance Equipment used: Rolling walker (2 wheeled) Transfers: Sit to/from Stand Sit to Stand: Min guard         General transfer comment: vc's for hand placement  Ambulation/Gait Ambulation/Gait  assistance: Min guard Ambulation Distance (Feet): 25 Feet Assistive device: Rolling walker (2 wheeled) Gait Pattern/deviations: Step-to pattern;Trunk flexed   Gait velocity interpretation: <1.8 ft/sec, indicative of risk for recurrent falls    Stairs            Wheelchair Mobility    Modified Rankin (Stroke Patients Only)       Balance Overall balance assessment: Needs assistance Sitting-balance support: Bilateral upper extremity supported;Feet supported Sitting balance-Leahy Scale: Good     Standing balance support: Bilateral upper extremity supported Standing balance-Leahy Scale: Fair                               Pertinent Vitals/Pain Pain Assessment: No/denies pain    Home Living   Living Arrangements: Alone Available Help at Discharge: Family (son and dtr in law call and check in )   Home Access: Stairs to enter   Entrance Stairs-Number of Steps: 3 steps with HR.  Home Layout: One level Home Equipment: Shower seat;Hand held shower head (O2 at home, and and adjustable [employer]) Additional Comments: Pt states that home health was going to bring out a walker on Monday.      Prior Function Level of Independence: Needs assistance   Gait / Transfers Assistance Needed: independent.   ADL's / Homemaking Assistance Needed: Pt has not driven for 3 years.  Friend assists with groceries on the 1st and  3rd Wed of the month.  Pt has assistance for household chores, and gets frozen meals because she can't stand long enough to cook a meal.         Hand Dominance   Dominant Hand: Right    Extremity/Trunk Assessment   Upper Extremity Assessment Upper Extremity Assessment: Generalized weakness    Lower Extremity Assessment Lower Extremity Assessment: Generalized weakness       Communication      Cognition Arousal/Alertness: Awake/alert Behavior During Therapy: WFL for tasks assessed/performed Overall Cognitive Status: Within Functional  Limits for tasks assessed                      General Comments      Exercises     Assessment/Plan    PT Assessment Patient needs continued PT services  PT Problem List Decreased strength;Decreased activity tolerance;Decreased balance;Decreased mobility;Decreased knowledge of use of DME;Decreased safety awareness;Decreased knowledge of precautions;Cardiopulmonary status limiting activity          PT Treatment Interventions DME instruction;Gait training;Functional mobility training;Therapeutic activities;Therapeutic exercise;Balance training;Patient/family education    PT Goals (Current goals can be found in the Care Plan section)  Acute Rehab PT Goals Patient Stated Goal: To get stronger and go back home. PT Goal Formulation: With patient Time For Goal Achievement: 03/17/16 Potential to Achieve Goals: Fair    Frequency Min 3X/week   Barriers to discharge Decreased caregiver support Pt lives alone    Co-evaluation               End of Session Equipment Utilized During Treatment: Gait belt;Oxygen Activity Tolerance: Patient limited by fatigue Patient left: in chair;with call bell/phone within reach Nurse Communication:  (Mobility sheet hung up in the room. )    Functional Assessment Tool Used: [place] "6-clicks"  Functional Limitation: Mobility: Walking and moving around Mobility: Walking and Moving Around Current Status [phone]): At least 20 percent but less than 40 percent impaired, limited or restricted Mobility: Walking and Moving Around Goal Status [phone]): At least 1 percent but less than 20 percent impaired, limited or restricted    Time: 1534-1601 PT Time Calculation (min) (ACUTE ONLY): 27 min   Charges:   PT Evaluation $PT Eval Low Complexity: 1 Procedure PT Treatments $Gait Training: 8-22 mins   PT G Codes:   PT G-Codes **NOT FOR INPATIENT CLASS** Functional Assessment Tool Used: [employer] "6-clicks"   Functional Limitation: Mobility: Walking and moving around Mobility: Walking and Moving Around Current Status [phone]): At least 20 percent but less than 40 percent impaired, limited or restricted Mobility: Walking and Moving Around Goal Status [phone]): At least 1 percent but less than 20 percent impaired, limited or restricted    Beth Dhilan Brauer, PT, DPT X: [phone]

## 2016-03-10 NOTE — Care Management Note (Addendum)
Case Management Note  Patient Details  Name: Leah Dalton MRN: [id number] Date of Birth: 1945-06-05  Subjective/Objective:   Chart reviewed for CM needs. Patient recently here and [place] arranged with [facility].  Per notes, patient is very weak and PT eval placed.  Patient has oxygen PTA.    Action/Plan: ? If patient may need SNF. CM will follow.  CM will place resumption orders for [facility] in case patient does return home.   Later entry: PT recommends [facility]. Patient agreeable. Will add PT to Home Health orders, patient currently has [facility] RN and [place] aide.   Expected Discharge Date:     03/12/2016             Expected Discharge Plan:  [place]  In-House Referral:     Discharge planning Services  CM Consult  Post Acute Care Choice:    Choice offered to:     DME Arranged:    DME Agency:     HH Arranged:    HH Agency:     Status of Service:  In process, will continue to follow  If discussed at Long Length of Stay Meetings, dates discussed:    Additional Comments:  Yassir Enis, Chauncey [place], RN 03/10/2016, 1:30 PM

## 2016-03-10 NOTE — H&P (Signed)
Leah Dalton MRN: [id number] DOB/AGE: 12/21/1945 70 y.o. Primary Care Physician:Tamarra Geiselman L, MD Admit date: 03/09/2016 Chief Complaint: Shortness of breath and weakness HPI: This is a 70 year old that I saw in my office yesterday. She had been in the hospital with COPD exacerbation seemed to be having more trouble. I had seen her about a week ago after she was discharged from hospital and things seem to be getting better. However since then she says that she has continuously deteriorated. She is short of breath with minimal exertion. She's coughing and congested. She is very weak. Her cough is mostly nonproductive. She's not had any chest pain. She has some swelling of her legs. She feels like she has some cardiac arrhythmia. She has finished her antibiotics and prednisone. She has been using her oxygen regularly. No nausea vomiting or diarrhea. No urinary symptoms.  Past Medical History:  Diagnosis Date  . AVM (arteriovenous malformation) of colon   . COPD (chronic obstructive pulmonary disease) ([place])    O2 dependent, started 05/31/2012  . Diverticula of colon   . Esophagitis   . GERD (gastroesophageal reflux disease)   . 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 History of tobacco abuse   . Osteoporosis   . Polycythemia    Has had to have phlebotomies in the pas   Past Surgical History:  Procedure Laterality Date  . ABDOMINAL HYSTERECTOMY    . CATARACT EXTRACTION    . 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 Engorged internal hemorrhoids; colonic diverticulosis.  Marland Kitchen ESOPHAGOGASTRODUODENOSCOPY N/A 02/23/2014   RMR:  Ulcerative/erosive esophagitis. small hiatal hernia        History reviewed. No pertinent family history.  Social History:  reports that she quit smoking about 5 years ago. She has never used smokeless tobacco. She reports that she drinks alcohol. She reports that she does not use  drugs.   Allergies:  Allergies  Allergen Reactions  . Codeine Itching  . Daliresp [Roflumilast] Diarrhea  . Keflex [Cephalexin] Other (See Comments)    Gives pt yeast infection  . Penicillins Itching    Has patient had a PCN reaction causing immediate rash, facial/tongue/throat swelling, SOB or lightheadedness with hypotension: No Has patient had a PCN reaction causing severe rash involving mucus membranes or skin necrosis: Yes Has patient had a PCN reaction that required hospitalization No Has patient had a PCN reaction occurring within the last 10 years: No If all of the above answers are "NO", then may proceed with Cephalosporin use.     Medications Prior to Admission  Medication Sig Dispense Refill  . albuterol (PROVENTIL HFA;VENTOLIN HFA) 108 (90 BASE) MCG/ACT inhaler Inhale 2 puffs into the lungs every 6 (six) hours as needed for wheezing.    Marland Kitchen albuterol (PROVENTIL) (2.5 MG/3ML) 0.083% nebulizer solution Take 2.5 mg by nebulization 3 (three) times daily.    Marland Kitchen aspirin EC 81 MG tablet Take 81 mg by mouth daily.     . calcium carbonate (OS-CAL) 1250 (500 Ca) MG chewable tablet Chew 1 tablet by mouth daily.    . Cholecalciferol (VITAMIN D-3) 5000 UNITS TABS Take 1 tablet by mouth daily.    . fluticasone furoate-vilanterol (BREO ELLIPTA) 200-25 MCG/INH AEPB Inhale 1 puff into the lungs daily.    . furosemide (LASIX) 40 MG tablet Take 40 mg by mouth daily as needed for fluid.     Marland Kitchen guaiFENesin (MUCINEX) 600 MG 12 hr tablet  Take 2 tablets (1,200 mg total) by mouth 2 (two) times daily. 60 tablet 5  . ipratropium (ATROVENT) 0.02 % nebulizer solution Take 500 mcg by nebulization 3 (three) times daily.    . pantoprazole (PROTONIX) 40 MG tablet Take 1 tablet (40 mg total) by mouth 2 (two) times daily. (Patient taking differently: Take 40 mg by mouth daily. ) 60 tablet 5  . Fluticasone-Salmeterol (ADVAIR) 250-50 MCG/DOSE AEPB Inhale 1 puff into the lungs every 12 (twelve) hours.    Marland Kitchen  levofloxacin (LEVAQUIN) 500 MG tablet Take 1 tablet (500 mg total) by mouth daily. 5 tablet 1  . predniSONE (STERAPRED UNI-PAK 21 TAB) 10 MG (21) TBPK tablet Take 1 tablet (10 mg total) by mouth daily. (Patient not taking: Reported on 03/09/2016) 21 tablet 1       [id number] from the symptoms mentioned above,there are no other symptoms referable to all systems reviewed.  Physical Exam: Blood pressure 135/81, pulse 87, temperature 97.4 F (36.3 C), temperature source Oral, resp. rate 15, height 5\' 4"  (1.626 m), weight 71.2 kg (157 lb), SpO2 97 %. Constitutional: She is awake and alert sitting in a wheelchair and looks chronically sick. Eyes: Her pupils react. EOMI. Ears nose mouth and throat: Her mucous membranes are dry. She is mildly hard of hearing. No sinus tenderness. Cardiovascular: Although she felt like she had some cardiac arrhythmia her heart is regular to exam now. She has trace edema of her legs. Respiratory: Her respiratory effort is increased. She has rhonchi bilaterally on her lung examination. Gastrointestinal: Her abdomen is soft with normal bowel sounds. Musculoskeletal: She is sitting in a wheelchair and does appear weak. Neurological: No focal abnormalities psychiatric: She appears somewhat anxious but has normal affect    Recent Labs  03/09/16 1817  WBC 10.7*  NEUTROABS 8.3*  HGB 11.6*  HCT 33.7*  MCV 91.3  PLT 274    Recent Labs  03/09/16 1817 03/10/16 0437  NA 123* 124*  K 2.4* 3.1*  CL 75* 79*  CO2 34* 32  GLUCOSE 106* 157*  BUN 8 7  CREATININE 0.44 0.39*  CALCIUM 8.7* 8.7*  lablast2(ast:2,ALT:2,alkphos:2,bilitot:2,prot:2,albumin:2)@    No results found for this or any previous visit (from the past 240 hour(s)).   Dg Chest 2 View  Result Date: 03/09/2016 CLINICAL DATA:  Shortness of breath, wheezing EXAM: CHEST  2 VIEW COMPARISON:  02/20/2016 FINDINGS: The heart size and mediastinal contours are within normal limits. Both lungs are clear. The  visualized skeletal structures are unremarkable. Hyperinflation again noted. IMPRESSION: No active cardiopulmonary disease.  Hyperinflation again noted. Electronically Signed   By: Lahoma Crocker M.D.   On: 03/09/2016 14:54   Dg Chest 2 View  Result Date: 02/20/2016 CLINICAL DATA:  Shortness of breath starting last night, history of bronchitis EXAM: CHEST  2 VIEW COMPARISON:  03/25/2015 FINDINGS: Cardiomediastinal silhouette is stable. Hyperinflation again noted. No acute infiltrate or pleural effusion. No pulmonary edema. Osteopenia and mild degenerative changes thoracic spine. Stable minimal compression deformity lower thoracic spine. IMPRESSION: No active cardiopulmonary disease.  Hyperinflation again noted. Electronically Signed   By: Lahoma Crocker M.D.   On: 02/20/2016 10:08   Impression: She has COPD exacerbation. I had her do a chest x-ray before she came to my office that did not show any infiltrate. However she's not doing well and is getting worse rather than better. I think she is going to require rehospitalization. Active Problems:   COPD exacerbation ([place])     Plan: Bring  back to the hospital for IV treatments.      Weylyn Ricciuti L   03/10/2016, 7:23 AM

## 2016-03-11 LAB — BASIC METABOLIC PANEL
ANION GAP: 10 (ref 5–15)
BUN: 7 mg/dL (ref 6–20)
CHLORIDE: 83 mmol/L — AB (ref 101–111)
CO2: 31 mmol/L (ref 22–32)
Calcium: 8.7 mg/dL — ABNORMAL LOW (ref 8.9–10.3)
Creatinine, Ser: 0.42 mg/dL — ABNORMAL LOW (ref 0.44–1.00)
Glucose, Bld: 148 mg/dL — ABNORMAL HIGH (ref 65–99)
POTASSIUM: 3.5 mmol/L (ref 3.5–5.1)
SODIUM: 124 mmol/L — AB (ref 135–145)

## 2016-03-12 LAB — BASIC METABOLIC PANEL
Anion gap: 8 (ref 5–15)
BUN: 9 mg/dL (ref 6–20)
CHLORIDE: 86 mmol/L — AB (ref 101–111)
CO2: 29 mmol/L (ref 22–32)
CREATININE: 0.44 mg/dL (ref 0.44–1.00)
Calcium: 8.7 mg/dL — ABNORMAL LOW (ref 8.9–10.3)
GFR calc Af Amer: >60 mL/min (ref >60)
GFR calc non Af Amer: >60 mL/min (ref >60)
GLUCOSE: 124 mg/dL — AB (ref 65–99)
POTASSIUM: 4.3 mmol/L (ref 3.5–5.1)
SODIUM: 123 mmol/L — AB (ref 135–145)

## 2016-03-12 MED ORDER — DIPHENHYDRAMINE HCL 25 MG PO CAPS
25.0000 mg | ORAL_CAPSULE | Freq: Four times a day (QID) | ORAL | Status: DC | PRN
  Administered 2016-03-12: 25 mg via ORAL
  Filled 2016-03-12: qty 1

## 2016-03-12 MED ORDER — FUROSEMIDE 10 MG/ML IJ SOLN
20.0000 mg | Freq: Once | INTRAMUSCULAR | Status: AC
  Administered 2016-03-12: 20 mg via INTRAVENOUS
  Filled 2016-03-12: qty 2

## 2016-03-12 NOTE — Progress Notes (Signed)
Notified Dr. Maudie Mercury that the patient was very SOB with exertion.  Her lung sounds were diminished and had coarse crackles.  She has some edema and was on NS at 50cc per hr.  Voiced to him that I thought that she may be in fluid overload.  Recommended Lasix, Foley, and fluid orders.  New orders given and followed.

## 2016-03-12 NOTE — Progress Notes (Signed)
Subjective: Patient feels slightly better. She is more short of breath when she try to walk. She has developed itching rash on her extremities. Her K+ has improved. Her sodium is still low.  Objective: Vital signs in last 24 hours: Temp:  [97.5 F (36.4 C)-97.7 F (36.5 C)] 97.7 F (36.5 C) (12/17 0643) Pulse Rate:  [99-109] 99 (12/17 0643) Resp:  [18-20] 18 (12/17 0643) BP: (152-185)/(87-118) 160/87 (12/17 0643) SpO2:  [9 %-97 %] 96 % (12/17 0820) Weight:  [74.4 kg (164 lb 0.4 oz)] 74.4 kg (164 lb 0.4 oz) (12/17 0500) Weight change:  Last BM Date: 03/10/16  Intake/Output from previous day: 12/16 0701 - 12/17 0700 In: 1950 [P.O.:1200; I.V.:600; IV Piggyback:150] Out: 550 [Urine:550]  PHYSICAL EXAM General appearance: alert and no distress Resp: diminished breath sounds bilaterally and wheezes bilaterally Cardio: S1, S2 normal GI: soft, non-tender; bowel sounds normal; no masses,  no organomegaly Extremities: extremities normal, atraumatic, no cyanosis or edema  Lab Results:  Results for orders placed or performed during the hospital encounter of 03/09/16 (from the past 48 hour(s))  Basic metabolic panel     Status: Abnormal   Collection Time: 03/11/16  7:01 AM  Result Value Ref Range   Sodium 124 (L) 135 - 145 mmol/L   Potassium 3.5 3.5 - 5.1 mmol/L   Chloride 83 (L) 101 - 111 mmol/L   CO2 31 22 - 32 mmol/L   Glucose, Bld 148 (H) 65 - 99 mg/dL   BUN 7 6 - 20 mg/dL   Creatinine, Ser 0.42 (L) 0.44 - 1.00 mg/dL   Calcium 8.7 (L) 8.9 - 10.3 mg/dL   GFR calc non Af Amer >60 >60 mL/min   GFR calc Af Amer >60 >60 mL/min    Comment: (NOTE) The eGFR has been calculated using the CKD EPI equation. This calculation has not been validated in all clinical situations. eGFR's persistently <60 mL/min signify possible Chronic Kidney Disease.    Anion gap 10 5 - 15  Basic metabolic panel     Status: Abnormal   Collection Time: 03/12/16  6:30 AM  Result Value Ref Range   Sodium  123 (L) 135 - 145 mmol/L   Potassium 4.3 3.5 - 5.1 mmol/L    Comment: DELTA CHECK NOTED NO VISIBLE HEMOLYSIS    Chloride 86 (L) 101 - 111 mmol/L   CO2 29 22 - 32 mmol/L   Glucose, Bld 124 (H) 65 - 99 mg/dL   BUN 9 6 - 20 mg/dL   Creatinine, Ser 0.44 0.44 - 1.00 mg/dL   Calcium 8.7 (L) 8.9 - 10.3 mg/dL   GFR calc non Af Amer >60 >60 mL/min   GFR calc Af Amer >60 >60 mL/min    Comment: (NOTE) The eGFR has been calculated using the CKD EPI equation. This calculation has not been validated in all clinical situations. eGFR's persistently <60 mL/min signify possible Chronic Kidney Disease.    Anion gap 8 5 - 15    ABGS No results for input(s): PHART, PO2ART, TCO2, HCO3 in the last 72 hours.  Invalid input(s): PCO2 CULTURES No results found for this or any previous visit (from the past 240 hour(s)). Studies/Results: No results found.  Medications: I have reviewed the patient's current medications.  Assesment:  Active Problems:   Acute-on-chronic respiratory failure (HCC)   Polycythemia   Reflux esophagitis   Hemochromatosis   COPD exacerbation (H[facility])   Acute hypokalemia hyponatremia   Plan: Medications reviewed Continue IV steroid and Iv antibiotics  Continue nebulizer treatment Will monitor BMP.    LOS: 3 days   Leah Dalton 03/12/2016, 10:59 AM

## 2016-03-12 NOTE — Progress Notes (Signed)
PT HAS SEVERE SOB WHEN GOING TO BATHROOM TO VOID. SHE REFUSE TO USE BSC. SHE WANT TO TRY TO REGAIN HER STRENGTH.

## 2016-03-12 NOTE — Progress Notes (Signed)
PT HAS FREQUENT ITCHING SPELLS ALL OVER. SHE WOULD LIKE A PRN MED FOR THIS.

## 2016-03-12 NOTE — Progress Notes (Signed)
Subjective: Patient is resting. She is receiving nebulizer, iv steroid and iv antibiotics. Her skin rash has improved. She is receiving benadryl and it is not itching.  Objective: Vital signs in last 24 hours: Temp:  [97.5 F (36.4 C)-97.7 F (36.5 C)] 97.7 F (36.5 C) (12/17 0643) Pulse Rate:  [99-109] 99 (12/17 0643) Resp:  [18-20] 18 (12/17 0643) BP: (152-185)/(87-118) 160/87 (12/17 0643) SpO2:  [9 %-97 %] 96 % (12/17 0820) Weight:  [74.4 kg (164 lb 0.4 oz)] 74.4 kg (164 lb 0.4 oz) (12/17 0500) Weight change:  Last BM Date: 03/10/16  Intake/Output from previous day: 12/16 0701 - 12/17 0700 In: 1950 [P.O.:1200; I.V.:600; IV Piggyback:150] Out: 550 [Urine:550]  PHYSICAL EXAM General appearance: alert and no distress Resp: diminished breath sounds bilaterally and wheezes bilaterally Cardio: S1, S2 normal GI: soft, non-tender; bowel sounds normal; no masses,  no organomegaly Extremities: extremities normal, atraumatic, no cyanosis or edema  Lab Results:  Results for orders placed or performed during the hospital encounter of 03/09/16 (from the past 48 hour(s))  Basic metabolic panel     Status: Abnormal   Collection Time: 03/11/16  7:01 AM  Result Value Ref Range   Sodium 124 (L) 135 - 145 mmol/L   Potassium 3.5 3.5 - 5.1 mmol/L   Chloride 83 (L) 101 - 111 mmol/L   CO2 31 22 - 32 mmol/L   Glucose, Bld 148 (H) 65 - 99 mg/dL   BUN 7 6 - 20 mg/dL   Creatinine, Ser 0.42 (L) 0.44 - 1.00 mg/dL   Calcium 8.7 (L) 8.9 - 10.3 mg/dL   GFR calc non Af Amer >60 >60 mL/min   GFR calc Af Amer >60 >60 mL/min    Comment: (NOTE) The eGFR has been calculated using the CKD EPI equation. This calculation has not been validated in all clinical situations. eGFR's persistently <60 mL/min signify possible Chronic Kidney Disease.    Anion gap 10 5 - 15  Basic metabolic panel     Status: Abnormal   Collection Time: 03/12/16  6:30 AM  Result Value Ref Range   Sodium 123 (L) 135 - 145 mmol/L    Potassium 4.3 3.5 - 5.1 mmol/L    Comment: DELTA CHECK NOTED NO VISIBLE HEMOLYSIS    Chloride 86 (L) 101 - 111 mmol/L   CO2 29 22 - 32 mmol/L   Glucose, Bld 124 (H) 65 - 99 mg/dL   BUN 9 6 - 20 mg/dL   Creatinine, Ser 0.44 0.44 - 1.00 mg/dL   Calcium 8.7 (L) 8.9 - 10.3 mg/dL   GFR calc non Af Amer >60 >60 mL/min   GFR calc Af Amer >60 >60 mL/min    Comment: (NOTE) The eGFR has been calculated using the CKD EPI equation. This calculation has not been validated in all clinical situations. eGFR's persistently <60 mL/min signify possible Chronic Kidney Disease.    Anion gap 8 5 - 15    ABGS No results for input(s): PHART, PO2ART, TCO2, HCO3 in the last 72 hours.  Invalid input(s): PCO2 CULTURES No results found for this or any previous visit (from the past 240 hour(s)). Studies/Results: No results found.  Medications: I have reviewed the patient's current medications.  Assesment:  Active Problems:   Acute-on-chronic respiratory failure (HCC)   Polycythemia   Reflux esophagitis   Hemochromatosis   COPD exacerbation (HCC)   Acute hypokalemia hyponatremia   Plan: Medications reviewed Continue IV steroid and Iv antibiotics Continue nebulizer treatment Will monitor BMP.  LOS: 3 days   Annisa Mazzarella 03/12/2016, 11:06 AM

## 2016-03-12 NOTE — Progress Notes (Signed)
Pharmacy Antibiotic Note  Leah Dalton is a 70 y.o. female admitted on 03/09/2016 with shortness of breath.  Pharmacy has been consulted for Levaquin dosing. Day#4 of levaquin. Patient slow to improve and still SOB  Plan: Continue Levaquin 750mg  IV every 24 hours. Monitor labs, micro and vitals.   Height: 5\' 4"  (162.6 cm) Weight: 164 lb 0.4 oz (74.4 kg) IBW/kg (Calculated) : 54.7  Temp (24hrs), Avg:97.6 F (36.4 C), Min:97.5 F (36.4 C), Max:97.7 F (36.5 C)   Recent Labs Lab 03/09/16 1817 03/10/16 0437 03/11/16 0701 03/12/16 0630  WBC 10.7*  --   --   --   CREATININE 0.44 0.39* 0.42* 0.44    Estimated Creatinine Clearance: 64.7 mL/min (by C-G formula based on SCr of 0.44 mg/dL).    Allergies  Allergen Reactions  . Codeine Itching  . Daliresp [Roflumilast] Diarrhea  . Keflex [Cephalexin] Other (See Comments)    Gives pt yeast infection  . Penicillins Itching    Has patient had a PCN reaction causing immediate rash, facial/tongue/throat swelling, SOB or lightheadedness with hypotension: No Has patient had a PCN reaction causing severe rash involving mucus membranes or skin necrosis: Yes Has patient had a PCN reaction that required hospitalization No Has patient had a PCN reaction occurring within the last 10 years: No If all of the above answers are "NO", then may proceed with Cephalosporin use.    Antimicrobials this admission: Levaquin 12/17 >>   Dose adjustments this admission: n/a   Microbiology results: No cultures  Thank you for allowing pharmacy to be a part of this patient's care. Isac Sarna, BS Pharm D, [place] Clinical Pharmacist Pager [phone] 03/12/2016 10:19 AM

## 2016-03-13 LAB — BASIC METABOLIC PANEL
Anion gap: 7 (ref 5–15)
BUN: 9 mg/dL (ref 6–20)
CALCIUM: 8.7 mg/dL — AB (ref 8.9–10.3)
CO2: 30 mmol/L (ref 22–32)
CREATININE: 0.42 mg/dL — AB (ref 0.44–1.00)
Chloride: 87 mmol/L — ABNORMAL LOW (ref 101–111)
GFR calc Af Amer: >60 mL/min (ref >60)
GLUCOSE: 104 mg/dL — AB (ref 65–99)
POTASSIUM: 4.6 mmol/L (ref 3.5–5.1)
SODIUM: 124 mmol/L — AB (ref 135–145)

## 2016-03-13 MED ORDER — FLUOCINONIDE 0.05 % EX CREA
TOPICAL_CREAM | Freq: Two times a day (BID) | CUTANEOUS | Status: DC
  Administered 2016-03-13 – 2016-03-15 (×5): via TOPICAL
  Filled 2016-03-13: qty 30

## 2016-03-13 NOTE — Progress Notes (Signed)
When patient's IV was flushed it leaked at entry site, this writer attempted to reinserted; however, patient stated you only get 1 stick, I am very difficult, This Probation officer informed charge nurse who called the [facility] to try, Await arrival of [facility].

## 2016-03-13 NOTE — Progress Notes (Signed)
Subjective: She says she still feels very weak. No new complaints. She's been able to cough up a little bit of sputum. She has a Foley catheter because she's been diuresed. She has had a rash which is better but she is still having some itching and requests a cream.  Objective: Vital signs in last 24 hours: Temp:  [97.3 F (36.3 C)-97.6 F (36.4 C)] 97.6 F (36.4 C) (12/18 0421) Pulse Rate:  [93-104] 93 (12/18 0421) Resp:  [20-21] 20 (12/18 0421) BP: (156-173)/(91-97) 156/94 (12/18 0421) SpO2:  [95 %-98 %] 96 % (12/18 0421) Weight:  [74.2 kg (163 lb 9.3 oz)] 74.2 kg (163 lb 9.3 oz) (12/18 0421) Weight change: -0.2 kg (-7.1 oz) Last BM Date: 03/10/16  Intake/Output from previous day: 12/17 0701 - 12/18 0700 In: 720 [P.O.:720] Out: 1600 [Urine:1600]  PHYSICAL EXAM General appearance: alert, cooperative and mild distress Resp: rhonchi bilaterally Cardio: regular rate and rhythm, S1, S2 normal, no murmur, click, rub or gallop GI: soft, non-tender; bowel sounds normal; no masses,  no organomegaly Extremities: extremities normal, atraumatic, no cyanosis or edema She has some erythema of her forearms. Her mucous membranes are moist. Her pupils are reactive.  Lab Results:  Results for orders placed or performed during the hospital encounter of 03/09/16 (from the past 48 hour(s))  Basic metabolic panel     Status: Abnormal   Collection Time: 03/12/16  6:30 AM  Result Value Ref Range   Sodium 123 ([place]) 135 - 145 mmol/[place]   Potassium 4.3 3.5 - 5.1 mmol/[place]    Comment: DELTA CHECK NOTED NO VISIBLE HEMOLYSIS    Chloride 86 ([place]) 101 - 111 mmol/[place]   CO2 29 22 - 32 mmol/[place]   Glucose, Bld 124 (H) 65 - 99 mg/dL   BUN 9 6 - 20 mg/dL   Creatinine, Ser 0.44 0.44 - 1.00 mg/dL   Calcium 8.7 ([place]) 8.9 - 10.3 mg/dL   GFR calc non Af Amer >60 >60 mL/min   GFR calc Af Amer >60 >60 mL/min    Comment: (NOTE) The eGFR has been calculated using the CKD EPI equation. This calculation has not been validated  in all clinical situations. eGFR's persistently <60 mL/min signify possible Chronic Kidney Disease.    Anion gap 8 5 - 15  Basic metabolic panel     Status: Abnormal   Collection Time: 03/13/16  4:41 AM  Result Value Ref Range   Sodium 124 ([place]) 135 - 145 mmol/[place]   Potassium 4.6 3.5 - 5.1 mmol/[place]   Chloride 87 ([place]) 101 - 111 mmol/[place]   CO2 30 22 - 32 mmol/[place]   Glucose, Bld 104 (H) 65 - 99 mg/dL   BUN 9 6 - 20 mg/dL   Creatinine, Ser 0.42 ([place]) 0.44 - 1.00 mg/dL   Calcium 8.7 ([place]) 8.9 - 10.3 mg/dL   GFR calc non Af Amer >60 >60 mL/min   GFR calc Af Amer >60 >60 mL/min    Comment: (NOTE) The eGFR has been calculated using the CKD EPI equation. This calculation has not been validated in all clinical situations. eGFR's persistently <60 mL/min signify possible Chronic Kidney Disease.    Anion gap 7 5 - 15    ABGS No results for input(s): PHART, PO2ART, TCO2, HCO3 in the last 72 hours.  Invalid input(s): PCO2 CULTURES No results found for this or any previous visit (from the past 240 hour(s)). Studies/Results: No results found.  Medications:  Prior to Admission:  Prescriptions Prior to Admission  Medication Sig Dispense Refill Last Dose  . albuterol (PROVENTIL HFA;VENTOLIN HFA) 108 (90 BASE) MCG/ACT inhaler Inhale 2 puffs into the lungs every 6 (six) hours as needed for wheezing.   03/09/2016 at Unknown time  . albuterol (PROVENTIL) (2.5 MG/3ML) 0.083% nebulizer solution Take 2.5 mg by nebulization 3 (three) times daily.   03/09/2016 at Unknown time  . aspirin EC 81 MG tablet Take 81 mg by mouth daily.    03/09/2016 at Unknown time  . calcium carbonate (OS-CAL) 1250 (500 Ca) MG chewable tablet Chew 1 tablet by mouth daily.   03/09/2016 at Unknown time  . Cholecalciferol (VITAMIN D-3) 5000 UNITS TABS Take 1 tablet by mouth daily.   03/09/2016 at Unknown time  . fluticasone furoate-vilanterol (BREO ELLIPTA) 200-25 MCG/INH AEPB Inhale 1 puff into the lungs daily.   03/09/2016 at Unknown time   . furosemide (LASIX) 40 MG tablet Take 40 mg by mouth daily as needed for fluid.    03/08/2016 at Unknown time  . guaiFENesin (MUCINEX) 600 MG 12 hr tablet Take 2 tablets (1,200 mg total) by mouth 2 (two) times daily. 60 tablet 5 03/09/2016 at morning  . ipratropium (ATROVENT) 0.02 % nebulizer solution Take 500 mcg by nebulization 3 (three) times daily.   03/09/2016 at Unknown time  . pantoprazole (PROTONIX) 40 MG tablet Take 1 tablet (40 mg total) by mouth 2 (two) times daily. (Patient taking differently: Take 40 mg by mouth daily. ) 60 tablet 5 03/09/2016 at Unknown time  . Fluticasone-Salmeterol (ADVAIR) 250-50 MCG/DOSE AEPB Inhale 1 puff into the lungs every 12 (twelve) hours.   HOLD  . levofloxacin (LEVAQUIN) 500 MG tablet Take 1 tablet (500 mg total) by mouth daily. 5 tablet 1   . predniSONE (STERAPRED UNI-PAK 21 TAB) 10 MG (21) TBPK tablet Take 1 tablet (10 mg total) by mouth daily. (Patient not taking: Reported on 03/09/2016) 21 tablet 1 Not Taking at Unknown time   Scheduled: . aspirin EC  81 mg Oral Daily  . enoxaparin (LOVENOX) injection  40 mg Subcutaneous Q24H  . fluocinonide cream   Topical BID  . fluticasone furoate-vilanterol  1 puff Inhalation Daily  . guaiFENesin  1,200 mg Oral BID  . ipratropium-albuterol  3 mL Nebulization Q4H  . levofloxacin (LEVAQUIN) IV  750 mg Intravenous Q24H  . methylPREDNISolone (SOLU-MEDROL) injection  40 mg Intravenous Q12H  . pantoprazole  40 mg Oral Daily  . potassium chloride  20 mEq Oral BID  . sertraline  50 mg Oral Daily  . sodium chloride flush  3 mL Intravenous Q12H   Continuous:  BWG:YKZLDJ chloride, acetaminophen **OR** acetaminophen, diphenhydrAMINE, ondansetron **OR** ondansetron (ZOFRAN) IV, sodium chloride flush, traMADol  Assesment: She was admitted with acute on chronic hypoxic respiratory failure, COPD exacerbation and she has significant peripheral edema which is being treated. She is very weak. According to her family she is  not active at home and now her family member who had been trying to help take care of her is sick and not able to attend. Active Problems:   Acute-on-chronic respiratory failure (HCC)   Polycythemia   Reflux esophagitis   Hemochromatosis   COPD exacerbation (HCC)   Acute hypokalemia    Plan: Continue treatments. Continue IV antibiotics IV steroids Lasix. Reassessed by physical therapy.    LOS: 4 days   Leah Dalton 03/13/2016, 8:20 AM

## 2016-03-14 MED ORDER — PREDNISONE 20 MG PO TABS
40.0000 mg | ORAL_TABLET | Freq: Every day | ORAL | Status: DC
Start: 2016-03-14 — End: 2016-03-15
  Administered 2016-03-14 – 2016-03-15 (×2): 40 mg via ORAL
  Filled 2016-03-14 (×2): qty 2

## 2016-03-14 MED ORDER — LEVOFLOXACIN 750 MG PO TABS
750.0000 mg | ORAL_TABLET | Freq: Every day | ORAL | Status: DC
  Administered 2016-03-14 – 2016-03-15 (×2): 750 mg via ORAL
  Filled 2016-03-14 (×2): qty 1

## 2016-03-14 NOTE — NC FL2 (Signed)
[place] MEDICAID FL2 LEVEL OF CARE SCREENING TOOL     IDENTIFICATION  Patient Name: Leah Dalton Birthdate: 08-May-1945 Sex: female Admission Date (Current Location): 03/09/2016  [facility] and [place] Number:  [employer] and Address:  [facility] [address], [place]      Provider Number: [id number]  Attending Physician Name and Address:  Sinda Du, MD  Relative Name and Phone Number:       Current Level of Care: Hospital Recommended Level of Care: [place] Prior Approval Number:    Date Approved/Denied:   PASRR Number: [id number]  Discharge Plan: SNF    Current Diagnoses: Patient Active Problem List   Diagnosis Date Noted  . COPD exacerbation ([place]) 02/20/2016  . Acute hypokalemia 02/20/2016  . 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 AVM (arteriovenous malformation) of colon 04/10/2014  . IDA (iron deficiency anemia) 04/10/2014  . External hemorrhoids 02/24/2014  . Reflux esophagitis   . Acute blood loss anemia 02/22/2014  . Symptomatic anemia 02/22/2014  . COPD with acute exacerbation ([place]) 06/07/2012  . Acute-on-chronic respiratory failure ([place]) 06/07/2012  . Polycythemia 06/07/2012  . Hyponatremia 06/07/2012    Orientation RESPIRATION BLADDER Height & Weight     Self, Time, Situation, Place  O2 (4 L) Continent Weight: 163 lb 9.3 oz (74.2 kg) Height:  5\' 4"  (162.6 cm)  BEHAVIORAL SYMPTOMS/MOOD NEUROLOGICAL BOWEL NUTRITION STATUS  Other (Comment) (none)  (n/a) Continent Diet (Heart healthy)  AMBULATORY STATUS COMMUNICATION OF NEEDS Skin   Limited Assist Verbally Bruising, Other (Comment) (Rash)                       Personal Care Assistance Level of Assistance  Bathing, Feeding, Dressing Bathing Assistance: Limited assistance Feeding assistance: Limited assistance Dressing Assistance: Limited assistance     Functional Limitations Info  Sight, Hearing, Speech Sight Info: Impaired Hearing  Info: Impaired Speech Info: Adequate    SPECIAL CARE FACTORS FREQUENCY  PT (By licensed PT)     PT Frequency: 5              Contractures Contractures Info: Not present    Additional Factors Info  Code Status, Allergies Code Status Info: Full code Allergies Info: Codeine, Daliresp (Roflumilast), Keflex (Cephalexin), Penicillins           Current Medications (03/14/2016):  This is the current hospital active medication list Current Facility-Administered Medications  Medication Dose Route Frequency Provider Last Rate Last Dose  . 0.9 %  sodium chloride infusion  250 mL Intravenous PRN Sinda Du, MD      . acetaminophen (TYLENOL) tablet 650 mg  650 mg Oral Q6H PRN Sinda Du, MD   650 mg at 03/12/16 0401   Or  . acetaminophen (TYLENOL) suppository 650 mg  650 mg Rectal Q6H PRN Sinda Du, MD      . aspirin EC tablet 81 mg  81 mg Oral Daily Sinda Du, MD   81 mg at 03/14/16 0959  . diphenhydrAMINE (BENADRYL) capsule 25 mg  25 mg Oral Q6H PRN Jani Gravel, MD   25 mg at 03/12/16 1144  . enoxaparin (LOVENOX) injection 40 mg  40 mg Subcutaneous Q24H Sinda Du, MD   40 mg at 03/13/16 1817  . fluocinonide cream (LIDEX) 0.05 %   Topical BID Sinda Du, MD      . fluticasone furoate-vilanterol (BREO ELLIPTA) 200-25 MCG/INH 1 puff  1 puff Inhalation Daily Sinda Du, MD   1  puff at 03/14/16 0812  . guaiFENesin (MUCINEX) 12 hr tablet 1,200 mg  1,200 mg Oral BID Sinda Du, MD   1,200 mg at 03/14/16 0959  . ipratropium-albuterol (DUONEB) 0.5-2.5 (3) MG/3ML nebulizer solution 3 mL  3 mL Nebulization Q4H Sinda Du, MD   3 mL at 03/14/16 1506  . levofloxacin (LEVAQUIN) tablet 750 mg  750 mg Oral Daily Sinda Du, MD   750 mg at 03/14/16 0959  . ondansetron (ZOFRAN) tablet 4 mg  4 mg Oral Q6H PRN Sinda Du, MD       Or  . ondansetron [facility]) injection 4 mg  4 mg Intravenous Q6H PRN Sinda Du, MD   4 mg at 03/11/16 0709  . pantoprazole  (PROTONIX) EC tablet 40 mg  40 mg Oral Daily Sinda Du, MD   40 mg at 03/14/16 0959  . potassium chloride (KLOR-CON) packet 20 mEq  20 mEq Oral BID Sinda Du, MD   20 mEq at 03/14/16 0959  . predniSONE (DELTASONE) tablet 40 mg  40 mg Oral Q breakfast Sinda Du, MD   40 mg at 03/14/16 0959  . sertraline (ZOLOFT) tablet 50 mg  50 mg Oral Daily Sinda Du, MD   50 mg at 03/14/16 0959  . sodium chloride flush (NS) 0.9 % injection 3 mL  3 mL Intravenous Q12H Sinda Du, MD   3 mL at 03/14/16 1001  . sodium chloride flush (NS) 0.9 % injection 3 mL  3 mL Intravenous PRN Sinda Du, MD      . traMADol Veatrice Bourbon) tablet 50 mg  50 mg Oral Q6H PRN Sinda Du, MD         Discharge Medications: Please see discharge summary for a list of discharge medications.  Relevant Imaging Results:  Relevant Lab Results:   Additional Information SSN: [id number]  [name], [place]

## 2016-03-14 NOTE — Care Management Note (Signed)
Case Management Note  Patient Details  Name: MAHSA BEGNOCHE MRN: [id number] Date of Birth: 01-18-1946    If discussed at Long Length of Stay Meetings, dates discussed:  03/14/2016  Additional Comments:  Mayela Bullard, Chauncey Reading, RN 03/14/2016, 2:36 PM

## 2016-03-14 NOTE — Progress Notes (Signed)
Physical Therapy Treatment Patient Details Name: Leah Dalton MRN: [id number] DOB: [birth year] Today's Date: 03/14/2016    History of Present Illness  70 yo F admitted recently with COPD exacerbation seemed to be having more trouble. I had seen her about a week ago after she was discharged from hospital and things seem to be getting better. However since then she says that she has continuously deteriorated. She is short of breath with minimal exertion. She's coughing and congested. She is very weak. Her cough is mostly nonproductive. She's not had any chest pain. She has some swelling of her legs. She feels like she has some cardiac arrhythmia. She has finished her antibiotics and prednisone. She has been using her oxygen regularly.  Dx: COPD exacerbation    PT Comments    Pt received in bed, and was agreeable to PT tx.  Pt became emotional and express that she is extremely feaful if she goes home because last time she went home with [facility], and ended up back in the hospital.  Pt also expressed that over the weekend she had not been out of the bed much due to having a foley catheter.  During PT tx today she continues to only require up to Min guard for functional mobility, but her gait distance was reduced down to 10 ft today due to c/o her legs giving out, and feeling SOB.  Pt also educated on exercises to perform seated, and in supine.  Recommendations updated for SNF at this time.  Pt strongly encouraged to start performing aspects of mobility on her own including exercises, and sitting up on the EOB.     Follow Up Recommendations  SNF (Pt states she would like to go to the [facility] in [place])     Equipment Recommendations  None recommended by PT    Recommendations for Other Services       Precautions / Restrictions Precautions Precautions: None Restrictions Weight Bearing Restrictions: No    Mobility  Bed Mobility Overal bed mobility: Modified Independent              General bed mobility comments: HOB raised  Transfers Overall transfer level: Needs assistance Equipment used: Rolling walker (2 wheeled) Transfers: Sit to/from Stand Sit to Stand: Min guard         General transfer comment: vc's for hand placement, and education on getting close to the edge of the chair, shifting weight forward to get "nose over toes", and then power up by pushing on the chair  Ambulation/Gait Ambulation/Gait assistance: Min guard   Assistive device: Rolling walker (2 wheeled) Gait Pattern/deviations: Step-to pattern   Gait velocity interpretation: <1.8 ft/sec, indicative of risk for recurrent falls General Gait Details: Pt demonstrates diminished step length during gait today, and expresses that she feels her LE's are weak.  She does demonstrate some tremors over entire body.  Further distance is limited due to pt c/o feeling like she is SOB and her anxiety seems to build up.    Stairs            Wheelchair Mobility    Modified Rankin (Stroke Patients Only)       Balance Overall balance assessment: Needs assistance Sitting-balance support: Bilateral upper extremity supported;Feet supported Sitting balance-Leahy Scale: Good     Standing balance support: Bilateral upper extremity supported Standing balance-Leahy Scale: Fair  Cognition Arousal/Alertness: Awake/alert Behavior During Therapy: WFL for tasks assessed/performed Overall Cognitive Status: Within Functional Limits for tasks assessed                      Exercises General Exercises - Lower Extremity Long Arc Quad: Strengthening;Both;10 reps;Seated Hip Flexion/Marching: Strengthening;10 reps;Seated Other Exercises Other Exercises: sit<>stand x 3 reps - pt expresses that she is not able to complete 5 reps due to feeling SOB.     General Comments        Pertinent Vitals/Pain Pain Assessment: No/denies pain    Home Living                       Prior Function            PT Goals (current goals can now be found in the care plan section) Acute Rehab PT Goals Patient Stated Goal: To get stronger and go back home. PT Goal Formulation: With patient Time For Goal Achievement: 03/17/16 Potential to Achieve Goals: Fair Progress towards PT goals: Not progressing toward goals - comment (Pt expresses she feels weaker.  Pt reports not getting out of bed over the weekend due to having a foley catheter.)    Frequency    Min 3X/week      PT Plan Discharge plan needs to be updated    Co-evaluation             End of Session Equipment Utilized During Treatment: Gait belt;Oxygen Activity Tolerance: Patient limited by fatigue Patient left: in chair;with call bell/phone within reach     Time: [id number] PT Time Calculation (min) (ACUTE ONLY): 39 min  Charges:  $Gait Training: 8-22 mins $Therapeutic Exercise: 8-22 mins $Therapeutic Activity: 8-22 mins                    G Codes:      Beth Mizuki Hoel, PT, DPT X: [phone]

## 2016-03-14 NOTE — Care Management (Signed)
Spoke with patient this morning regarding new home health agency [facility] with [place] services. She is agreeable if she does return home. She has a PT consult pending. Patient states she is weaker than normal, lives alone and is having a hard time managing at home alone. Will follow up post PT consult.

## 2016-03-14 NOTE — Progress Notes (Signed)
Subjective: She says she feels better but is still very weak. The rash is improving. She's coughing and coughing up a little bit of sputum. No other new complaints.  Objective: Vital signs in last 24 hours: Temp:  [97.9 F (36.6 C)-98.2 F (36.8 C)] 98.2 F (36.8 C) (12/19 0631) Pulse Rate:  [91-99] 91 (12/19 0631) Resp:  [18-20] 20 (12/19 0631) BP: (150-159)/(63-89) 159/89 (12/19 0631) SpO2:  [95 %-99 %] 96 % (12/19 0809) Weight change:  Last BM Date: 03/10/16  Intake/Output from previous day: 12/18 0701 - 12/19 0700 In: 880 [P.O.:730; IV Piggyback:150] Out: 1525 [Urine:1525]  PHYSICAL EXAM General appearance: alert, cooperative and no distress Resp: rhonchi bilaterally Cardio: regular rate and rhythm, S1, S2 normal, no murmur, click, rub or gallop GI: soft, non-tender; bowel sounds normal; no masses,  no organomegaly Extremities: She still has some swelling of her legs but that's chronic Skin warm and dry. Pupils reactive.  Lab Results:  Results for orders placed or performed during the hospital encounter of 03/09/16 (from the past 48 hour(s))  Basic metabolic panel     Status: Abnormal   Collection Time: 03/13/16  4:41 AM  Result Value Ref Range   Sodium 124 (L) 135 - 145 mmol/L   Potassium 4.6 3.5 - 5.1 mmol/L   Chloride 87 (L) 101 - 111 mmol/L   CO2 30 22 - 32 mmol/L   Glucose, Bld 104 (H) 65 - 99 mg/dL   BUN 9 6 - 20 mg/dL   Creatinine, Ser 0.42 (L) 0.44 - 1.00 mg/dL   Calcium 8.7 (L) 8.9 - 10.3 mg/dL   GFR calc non Af Amer >60 >60 mL/min   GFR calc Af Amer >60 >60 mL/min    Comment: (NOTE) The eGFR has been calculated using the CKD EPI equation. This calculation has not been validated in all clinical situations. eGFR's persistently <60 mL/min signify possible Chronic Kidney Disease.    Anion gap 7 5 - 15    ABGS No results for input(s): PHART, PO2ART, TCO2, HCO3 in the last 72 hours.  Invalid input(s): PCO2 CULTURES No results found for this or any  previous visit (from the past 240 hour(s)). Studies/Results: No results found.  Medications:  Prior to Admission:  Prescriptions Prior to Admission  Medication Sig Dispense Refill Last Dose  . albuterol (PROVENTIL HFA;VENTOLIN HFA) 108 (90 BASE) MCG/ACT inhaler Inhale 2 puffs into the lungs every 6 (six) hours as needed for wheezing.   03/09/2016 at Unknown time  . albuterol (PROVENTIL) (2.5 MG/3ML) 0.083% nebulizer solution Take 2.5 mg by nebulization 3 (three) times daily.   03/09/2016 at Unknown time  . aspirin EC 81 MG tablet Take 81 mg by mouth daily.    03/09/2016 at Unknown time  . calcium carbonate (OS-CAL) 1250 (500 Ca) MG chewable tablet Chew 1 tablet by mouth daily.   03/09/2016 at Unknown time  . Cholecalciferol (VITAMIN D-3) 5000 UNITS TABS Take 1 tablet by mouth daily.   03/09/2016 at Unknown time  . fluticasone furoate-vilanterol (BREO ELLIPTA) 200-25 MCG/INH AEPB Inhale 1 puff into the lungs daily.   03/09/2016 at Unknown time  . furosemide (LASIX) 40 MG tablet Take 40 mg by mouth daily as needed for fluid.    03/08/2016 at Unknown time  . guaiFENesin (MUCINEX) 600 MG 12 hr tablet Take 2 tablets (1,200 mg total) by mouth 2 (two) times daily. 60 tablet 5 03/09/2016 at morning  . ipratropium (ATROVENT) 0.02 % nebulizer solution Take 500 mcg by nebulization  3 (three) times daily.   03/09/2016 at Unknown time  . pantoprazole (PROTONIX) 40 MG tablet Take 1 tablet (40 mg total) by mouth 2 (two) times daily. (Patient taking differently: Take 40 mg by mouth daily. ) 60 tablet 5 03/09/2016 at Unknown time  . Fluticasone-Salmeterol (ADVAIR) 250-50 MCG/DOSE AEPB Inhale 1 puff into the lungs every 12 (twelve) hours.   HOLD  . levofloxacin (LEVAQUIN) 500 MG tablet Take 1 tablet (500 mg total) by mouth daily. 5 tablet 1   . predniSONE (STERAPRED UNI-PAK 21 TAB) 10 MG (21) TBPK tablet Take 1 tablet (10 mg total) by mouth daily. (Patient not taking: Reported on 03/09/2016) 21 tablet 1 Not Taking  at Unknown time   Scheduled: . aspirin EC  81 mg Oral Daily  . enoxaparin (LOVENOX) injection  40 mg Subcutaneous Q24H  . fluocinonide cream   Topical BID  . fluticasone furoate-vilanterol  1 puff Inhalation Daily  . guaiFENesin  1,200 mg Oral BID  . ipratropium-albuterol  3 mL Nebulization Q4H  . levofloxacin (LEVAQUIN) IV  750 mg Intravenous Q24H  . methylPREDNISolone (SOLU-MEDROL) injection  40 mg Intravenous Q12H  . pantoprazole  40 mg Oral Daily  . potassium chloride  20 mEq Oral BID  . sertraline  50 mg Oral Daily  . sodium chloride flush  3 mL Intravenous Q12H   Continuous:  ZOX:WRUEAV chloride, acetaminophen **OR** acetaminophen, diphenhydrAMINE, ondansetron **OR** ondansetron (ZOFRAN) IV, sodium chloride flush, traMADol  Assesment: She has COPD exacerbation, acute hypokalemia, hyponatremia acute on chronic respiratory failure. She's weak. She is clearly better. Active Problems:   Acute-on-chronic respiratory failure (HCC)   Polycythemia   Reflux esophagitis   Hemochromatosis   COPD exacerbation (HCC)   Acute hypokalemia    Plan: Discontinue Foley. Reevaluated by PT. The concern is that she is apparently not very active at home and her caretaker is sick. Probable discharge tomorrow    LOS: 5 days   Jamauri Kruzel L 03/14/2016, 8:31 AM

## 2016-03-15 DIAGNOSIS — A419 Sepsis, unspecified organism: Secondary | ICD-10-CM | POA: Diagnosis not present

## 2016-03-15 DIAGNOSIS — E8809 Other disorders of plasma-protein metabolism, not elsewhere classified: Secondary | ICD-10-CM | POA: Diagnosis present

## 2016-03-15 DIAGNOSIS — E440 Moderate protein-calorie malnutrition: Secondary | ICD-10-CM | POA: Diagnosis present

## 2016-03-15 DIAGNOSIS — J449 Chronic obstructive pulmonary disease, unspecified: Secondary | ICD-10-CM | POA: Diagnosis not present

## 2016-03-15 DIAGNOSIS — R6520 Severe sepsis without septic shock: Secondary | ICD-10-CM | POA: Diagnosis present

## 2016-03-15 DIAGNOSIS — D649 Anemia, unspecified: Secondary | ICD-10-CM | POA: Diagnosis present

## 2016-03-15 DIAGNOSIS — R2681 Unsteadiness on feet: Secondary | ICD-10-CM | POA: Diagnosis not present

## 2016-03-15 DIAGNOSIS — R1319 Other dysphagia: Secondary | ICD-10-CM | POA: Diagnosis not present

## 2016-03-15 DIAGNOSIS — E559 Vitamin D deficiency, unspecified: Secondary | ICD-10-CM | POA: Diagnosis not present

## 2016-03-15 DIAGNOSIS — J9621 Acute and chronic respiratory failure with hypoxia: Secondary | ICD-10-CM | POA: Diagnosis present

## 2016-03-15 DIAGNOSIS — F331 Major depressive disorder, recurrent, moderate: Secondary | ICD-10-CM | POA: Diagnosis not present

## 2016-03-15 DIAGNOSIS — Z741 Need for assistance with personal care: Secondary | ICD-10-CM | POA: Diagnosis not present

## 2016-03-15 DIAGNOSIS — D751 Secondary polycythemia: Secondary | ICD-10-CM | POA: Diagnosis not present

## 2016-03-15 DIAGNOSIS — R0902 Hypoxemia: Secondary | ICD-10-CM | POA: Diagnosis not present

## 2016-03-15 DIAGNOSIS — E876 Hypokalemia: Secondary | ICD-10-CM | POA: Diagnosis not present

## 2016-03-15 DIAGNOSIS — J9622 Acute and chronic respiratory failure with hypercapnia: Secondary | ICD-10-CM | POA: Diagnosis present

## 2016-03-15 DIAGNOSIS — J440 Chronic obstructive pulmonary disease with acute lower respiratory infection: Secondary | ICD-10-CM | POA: Diagnosis present

## 2016-03-15 DIAGNOSIS — R609 Edema, unspecified: Secondary | ICD-10-CM | POA: Diagnosis not present

## 2016-03-15 DIAGNOSIS — F329 Major depressive disorder, single episode, unspecified: Secondary | ICD-10-CM | POA: Diagnosis present

## 2016-03-15 DIAGNOSIS — Z90710 Acquired absence of both cervix and uterus: Secondary | ICD-10-CM | POA: Diagnosis not present

## 2016-03-15 DIAGNOSIS — R531 Weakness: Secondary | ICD-10-CM | POA: Diagnosis not present

## 2016-03-15 DIAGNOSIS — Z6826 Body mass index (BMI) 26.0-26.9, adult: Secondary | ICD-10-CM | POA: Diagnosis not present

## 2016-03-15 DIAGNOSIS — E871 Hypo-osmolality and hyponatremia: Secondary | ICD-10-CM | POA: Diagnosis present

## 2016-03-15 DIAGNOSIS — Z87898 Personal history of other specified conditions: Secondary | ICD-10-CM | POA: Diagnosis not present

## 2016-03-15 DIAGNOSIS — D473 Essential (hemorrhagic) thrombocythemia: Secondary | ICD-10-CM | POA: Diagnosis present

## 2016-03-15 DIAGNOSIS — M810 Age-related osteoporosis without current pathological fracture: Secondary | ICD-10-CM | POA: Diagnosis not present

## 2016-03-15 DIAGNOSIS — J441 Chronic obstructive pulmonary disease with (acute) exacerbation: Secondary | ICD-10-CM | POA: Diagnosis present

## 2016-03-15 DIAGNOSIS — D509 Iron deficiency anemia, unspecified: Secondary | ICD-10-CM | POA: Diagnosis not present

## 2016-03-15 DIAGNOSIS — I10 Essential (primary) hypertension: Secondary | ICD-10-CM | POA: Diagnosis not present

## 2016-03-15 DIAGNOSIS — Z888 Allergy status to other drugs, medicaments and biological substances status: Secondary | ICD-10-CM | POA: Diagnosis not present

## 2016-03-15 DIAGNOSIS — Z881 Allergy status to other antibiotic agents status: Secondary | ICD-10-CM | POA: Diagnosis not present

## 2016-03-15 DIAGNOSIS — F339 Major depressive disorder, recurrent, unspecified: Secondary | ICD-10-CM | POA: Diagnosis not present

## 2016-03-15 DIAGNOSIS — E878 Other disorders of electrolyte and fluid balance, not elsewhere classified: Secondary | ICD-10-CM | POA: Diagnosis present

## 2016-03-15 DIAGNOSIS — K219 Gastro-esophageal reflux disease without esophagitis: Secondary | ICD-10-CM | POA: Diagnosis not present

## 2016-03-15 DIAGNOSIS — L299 Pruritus, unspecified: Secondary | ICD-10-CM | POA: Diagnosis not present

## 2016-03-15 DIAGNOSIS — J189 Pneumonia, unspecified organism: Secondary | ICD-10-CM | POA: Diagnosis not present

## 2016-03-15 DIAGNOSIS — L853 Xerosis cutis: Secondary | ICD-10-CM | POA: Diagnosis not present

## 2016-03-15 DIAGNOSIS — M6281 Muscle weakness (generalized): Secondary | ICD-10-CM | POA: Diagnosis not present

## 2016-03-15 DIAGNOSIS — Z880 Allergy status to penicillin: Secondary | ICD-10-CM | POA: Diagnosis not present

## 2016-03-15 DIAGNOSIS — Z885 Allergy status to narcotic agent status: Secondary | ICD-10-CM | POA: Diagnosis not present

## 2016-03-15 DIAGNOSIS — Z87891 Personal history of nicotine dependence: Secondary | ICD-10-CM | POA: Diagnosis not present

## 2016-03-15 LAB — COMPREHENSIVE METABOLIC PANEL
ALT: 14 U/L (ref 14–54)
ANION GAP: 5 (ref 5–15)
AST: 13 U/L — ABNORMAL LOW (ref 15–41)
Albumin: 3 g/dL — ABNORMAL LOW (ref 3.5–5.0)
Alkaline Phosphatase: 51 U/L (ref 38–126)
BUN: 11 mg/dL (ref 6–20)
CALCIUM: 9.1 mg/dL (ref 8.9–10.3)
CO2: 31 mmol/L (ref 22–32)
CREATININE: 0.46 mg/dL (ref 0.44–1.00)
Chloride: 90 mmol/L — ABNORMAL LOW (ref 101–111)
Glucose, Bld: 104 mg/dL — ABNORMAL HIGH (ref 65–99)
Potassium: 4.7 mmol/L (ref 3.5–5.1)
SODIUM: 126 mmol/L — AB (ref 135–145)
Total Bilirubin: 0.6 mg/dL (ref 0.3–1.2)
Total Protein: 5.2 g/dL — ABNORMAL LOW (ref 6.5–8.1)

## 2016-03-15 MED ORDER — LEVOFLOXACIN 750 MG PO TABS: 750.0000 mg | ORAL_TABLET | Freq: Every day | ORAL | Status: AC

## 2016-03-15 MED ORDER — FLUOCINONIDE 0.05 % EX CREA: TOPICAL_CREAM | Freq: Two times a day (BID) | CUTANEOUS | 0 refills | Status: DC

## 2016-03-15 MED ORDER — POTASSIUM CHLORIDE 20 MEQ PO PACK: 20.0000 meq | PACK | Freq: Every day | ORAL | Status: AC

## 2016-03-15 MED ORDER — DIPHENHYDRAMINE HCL 25 MG PO CAPS: 25.0000 mg | ORAL_CAPSULE | Freq: Four times a day (QID) | ORAL | 0 refills | Status: DC | PRN

## 2016-03-15 MED ORDER — PREDNISONE 10 MG (21) PO TBPK: ORAL_TABLET | ORAL | 0 refills | Status: DC

## 2016-03-15 MED ORDER — SERTRALINE HCL 50 MG PO TABS
50.0000 mg | ORAL_TABLET | Freq: Every day | ORAL | Status: DC
Start: 2016-03-16 — End: 2016-08-21

## 2016-03-15 NOTE — Clinical Social Work Note (Signed)
Clinical Social Work Assessment  Patient Details  Name: Leah Dalton MRN: [id number] Date of Birth: 02-17-1946  Date of referral:  03/15/16               Reason for consult:  Discharge Planning                Permission sought to share information with:    Permission granted to share information::     Name::        Agency::     Relationship::     Contact Information:     Housing/Transportation Living arrangements for the past 2 months:  Single Family Home Source of Information:  Patient Patient Interpreter Needed:  None Criminal Activity/Legal Involvement Pertinent to Current Situation/Hospitalization:  No - Comment as needed Significant Relationships:  Adult Children Lives with:  Self Do you feel safe going back to the place where you live?  No Need for family participation in patient care:  Yes (Comment)  Care giving concerns:  Pt lives alone and is not at baseline.    Social Worker assessment / plan:  CSW met with pt at bedside. Pt alert and oriented and reports she lives alone. PT initially assessed pt and recommended home health. Yesterday, pt was re-evaluated and recommended for SNF. CSW discussed placement process, including Medicare coverage/criteria. Pt requests [facility]. Facility can accept.   Employment status:  Retired Forensic scientist:  Medicare PT Recommendations:  [place] / Referral to community resources:  [place]  Patient/Family's Response to care:  Pt requesting transfer to SNF prior to return home.   Patient/Family's Understanding of and Emotional Response to Diagnosis, Current Treatment, and Prognosis:  Pt aware of admission diagnosis and plan for d/c to [facility] today.   Emotional Assessment Appearance:  Appears stated age Attitude/Demeanor/Rapport:  Other (Cooperative) Affect (typically observed):  Accepting Orientation:  Oriented to Self, Oriented to Place, Oriented  to  Time, Oriented to Situation Alcohol / Substance use:  Not Applicable Psych involvement (Current and /or in the community):  No (Comment)  Discharge Needs  Concerns to be addressed:  Discharge Planning Concerns Readmission within the last 30 days:  No Current discharge risk:  Lives alone, Physical Impairment Barriers to Discharge:  No Barriers Identified   [name], [place] 03/15/2016, 8:38 AM [phone]

## 2016-03-15 NOTE — Discharge Summary (Signed)
Physician Discharge Summary  Patient ID: Leah Dalton MRN: [id number] DOB/AGE: 70-Jan-1947 70 y.o. Primary Care Physician:Mylei Brackeen L, MD Admit date: 03/09/2016 Discharge date: 03/15/2016    Discharge Diagnoses:   Active Problems:   Acute-on-chronic respiratory failure (HCC)   Polycythemia   Reflux esophagitis   Hemochromatosis   COPD exacerbation (HCC)   Acute hypokalemia   Allergies as of 03/15/2016      Reactions   Codeine Itching   Daliresp [roflumilast] Diarrhea   Keflex [cephalexin] Other (See Comments)   Gives pt yeast infection   Penicillins Itching   Has patient had a PCN reaction causing immediate rash, facial/tongue/throat swelling, SOB or lightheadedness with hypotension: No Has patient had a PCN reaction causing severe rash involving mucus membranes or skin necrosis: Yes Has patient had a PCN reaction that required hospitalization No Has patient had a PCN reaction occurring within the last 10 years: No If all of the above answers are "NO", then may proceed with Cephalosporin use.      Medication List    STOP taking these medications   Fluticasone-Salmeterol 250-50 MCG/DOSE Aepb Commonly known as:  ADVAIR     TAKE these medications   albuterol 108 (90 Base) MCG/ACT inhaler Commonly known as:  PROVENTIL HFA;VENTOLIN HFA Inhale 2 puffs into the lungs every 6 (six) hours as needed for wheezing.   albuterol (2.5 MG/3ML) 0.083% nebulizer solution Commonly known as:  PROVENTIL Take 2.5 mg by nebulization 3 (three) times daily.   aspirin EC 81 MG tablet Take 81 mg by mouth daily.   BREO ELLIPTA 200-25 MCG/INH Aepb Generic drug:  fluticasone furoate-vilanterol Inhale 1 puff into the lungs daily.   calcium carbonate 1250 (500 Ca) MG chewable tablet Commonly known as:  OS-CAL Chew 1 tablet by mouth daily.   diphenhydrAMINE 25 mg capsule Commonly known as:  BENADRYL Take 1 capsule (25 mg total) by mouth every 6 (six) hours as needed for itching  (rash).   fluocinonide cream 0.05 % Commonly known as:  LIDEX Apply topically 2 (two) times daily.   furosemide 40 MG tablet Commonly known as:  LASIX Take 40 mg by mouth daily as needed for fluid.   guaiFENesin 600 MG 12 hr tablet Commonly known as:  MUCINEX Take 2 tablets (1,200 mg total) by mouth 2 (two) times daily.   ipratropium 0.02 % nebulizer solution Commonly known as:  ATROVENT Take 500 mcg by nebulization 3 (three) times daily.   levofloxacin 500 MG tablet Commonly known as:  LEVAQUIN Take 1 tablet (500 mg total) by mouth daily. What changed:  Another medication with the same name was added. Make sure you understand how and when to take each.   levofloxacin 750 MG tablet Commonly known as:  LEVAQUIN Take 1 tablet (750 mg total) by mouth daily. Start taking on:  03/16/2016 What changed:  You were already taking a medication with the same name, and this prescription was added. Make sure you understand how and when to take each.   pantoprazole 40 MG tablet Commonly known as:  PROTONIX Take 1 tablet (40 mg total) by mouth 2 (two) times daily. What changed:  when to take this   potassium chloride 20 MEQ packet Commonly known as:  KLOR-CON Take 20 mEq by mouth daily.   predniSONE 10 MG (21) Tbpk tablet Commonly known as:  STERAPRED UNI-PAK 21 TAB Take by package instructions What changed:  how much to take  how to take this  when to take this  additional  instructions   sertraline 50 MG tablet Commonly known as:  ZOLOFT Take 1 tablet (50 mg total) by mouth daily. Start taking on:  03/16/2016   Vitamin D-3 5000 units Tabs Take 1 tablet by mouth daily.       Discharged Condition:Improved    Consults: None  Significant Diagnostic Studies: Dg Chest 2 View  Result Date: 03/09/2016 CLINICAL DATA:  Shortness of breath, wheezing EXAM: CHEST  2 VIEW COMPARISON:  02/20/2016 FINDINGS: The heart size and mediastinal contours are within normal limits.  Both lungs are clear. The visualized skeletal structures are unremarkable. Hyperinflation again noted. IMPRESSION: No active cardiopulmonary disease.  Hyperinflation again noted. Electronically Signed   By: Lahoma Crocker M.D.   On: 03/09/2016 14:54   Dg Chest 2 View  Result Date: 02/20/2016 CLINICAL DATA:  Shortness of breath starting last night, history of bronchitis EXAM: CHEST  2 VIEW COMPARISON:  03/25/2015 FINDINGS: Cardiomediastinal silhouette is stable. Hyperinflation again noted. No acute infiltrate or pleural effusion. No pulmonary edema. Osteopenia and mild degenerative changes thoracic spine. Stable minimal compression deformity lower thoracic spine. IMPRESSION: No active cardiopulmonary disease.  Hyperinflation again noted. Electronically Signed   By: Lahoma Crocker M.D.   On: 02/20/2016 10:08    Lab Results: Basic Metabolic Panel:  Recent Labs  03/13/16 0441 03/15/16 0545  NA 124* 126*  K 4.6 4.7  CL 87* 90*  CO2 30 31  GLUCOSE 104* 104*  BUN 9 11  CREATININE 0.42* 0.46  CALCIUM 8.7* 9.1   Liver Function Tests:  Recent Labs  03/15/16 0545  AST 13*  ALT 14  ALKPHOS 51  BILITOT 0.6  PROT 5.2*  ALBUMIN 3.0*     CBC: No results for input(s): WBC, NEUTROABS, HGB, HCT, MCV, PLT in the last 72 hours.  No results found for this or any previous visit (from the past 240 hour(s)).   Hospital Course: This is a 70 year old who had been in the hospital with COPD exacerbation and improved. She came to my office for post hospital visit and seemed to be doing better but then called a few days later and was much worse. She came back to the office was very weak coughing and congested and was readmitted. She was started on IV antibiotics IV steroids and improved. She was very weak and was felt that she would benefit from skilled care facility placement for rehabilitation and that's going to be accomplished.  Discharge Exam: Blood pressure (!) 147/65, pulse 93, temperature 97.8 F  (36.6 C), temperature source Oral, resp. rate 18, height 5\' 4"  (1.626 m), weight 74.2 kg (163 lb 9.3 oz), SpO2 100 %. She is awake and alert. She is wearing oxygen. Her chest shows rhonchi bilaterally. She has chronic venous stasis changes. Her heart is regular  Disposition: To skilled care facility. She will take Levaquin 750 mg daily 7 days. She will be on a prednisone Dosepak. She will continue oxygen to maintain O2 saturation greater than 90%. She will have PT OT and speech as needed.  Discharge Instructions    Discharge to SNF when bed available    Complete by:  As directed         Signed: Joenathan Sakuma L   03/15/2016, 8:45 AM

## 2016-03-15 NOTE — Progress Notes (Signed)
Subjective: She says she feels a little better. Skilled care placement for rehabilitation has been recommended and I think that's appropriate. She is still very weak. She is still coughing.  Objective: Vital signs in last 24 hours: Temp:  [97.8 F (36.6 C)-98.1 F (36.7 C)] 97.8 F (36.6 C) (12/20 0542) Pulse Rate:  [93-104] 93 (12/20 0542) Resp:  [18-22] 18 (12/20 0542) BP: (147-148)/(65-88) 147/65 (12/20 0542) SpO2:  [97 %-100 %] 100 % (12/20 0753) Weight change:  Last BM Date: 03/14/16  Intake/Output from previous day: 12/19 0701 - 12/20 0700 In: 480 [P.O.:480] Out: 700 [Urine:700]  PHYSICAL EXAM General appearance: alert, cooperative and mild distress Resp: rhonchi bilaterally Cardio: regular rate and rhythm, S1, S2 normal, no murmur, click, rub or gallop GI: soft, non-tender; bowel sounds normal; no masses,  no organomegaly Extremities: Trace edema still Pupils react. Mucous membranes are moist. Skin warm and dry  Lab Results:  Results for orders placed or performed during the hospital encounter of 03/09/16 (from the past 48 hour(s))  Comprehensive metabolic panel     Status: Abnormal   Collection Time: 03/15/16  5:45 AM  Result Value Ref Range   Sodium 126 (L) 135 - 145 mmol/L   Potassium 4.7 3.5 - 5.1 mmol/L   Chloride 90 (L) 101 - 111 mmol/L   CO2 31 22 - 32 mmol/L   Glucose, Bld 104 (H) 65 - 99 mg/dL   BUN 11 6 - 20 mg/dL   Creatinine, Ser 0.46 0.44 - 1.00 mg/dL   Calcium 9.1 8.9 - 10.3 mg/dL   Total Protein 5.2 (L) 6.5 - 8.1 g/dL   Albumin 3.0 (L) 3.5 - 5.0 g/dL   AST 13 (L) 15 - 41 U/L   ALT 14 14 - 54 U/L   Alkaline Phosphatase 51 38 - 126 U/L   Total Bilirubin 0.6 0.3 - 1.2 mg/dL   GFR calc non Af Amer >60 >60 mL/min   GFR calc Af Amer >60 >60 mL/min    Comment: (NOTE) The eGFR has been calculated using the CKD EPI equation. This calculation has not been validated in all clinical situations. eGFR's persistently <60 mL/min signify possible Chronic  Kidney Disease.    Anion gap 5 5 - 15    ABGS No results for input(s): PHART, PO2ART, TCO2, HCO3 in the last 72 hours.  Invalid input(s): PCO2 CULTURES No results found for this or any previous visit (from the past 240 hour(s)). Studies/Results: No results found.  Medications:  Prior to Admission:  Prescriptions Prior to Admission  Medication Sig Dispense Refill Last Dose  . albuterol (PROVENTIL HFA;VENTOLIN HFA) 108 (90 BASE) MCG/ACT inhaler Inhale 2 puffs into the lungs every 6 (six) hours as needed for wheezing.   03/09/2016 at Unknown time  . albuterol (PROVENTIL) (2.5 MG/3ML) 0.083% nebulizer solution Take 2.5 mg by nebulization 3 (three) times daily.   03/09/2016 at Unknown time  . aspirin EC 81 MG tablet Take 81 mg by mouth daily.    03/09/2016 at Unknown time  . calcium carbonate (OS-CAL) 1250 (500 Ca) MG chewable tablet Chew 1 tablet by mouth daily.   03/09/2016 at Unknown time  . Cholecalciferol (VITAMIN D-3) 5000 UNITS TABS Take 1 tablet by mouth daily.   03/09/2016 at Unknown time  . fluticasone furoate-vilanterol (BREO ELLIPTA) 200-25 MCG/INH AEPB Inhale 1 puff into the lungs daily.   03/09/2016 at Unknown time  . furosemide (LASIX) 40 MG tablet Take 40 mg by mouth daily as needed for fluid.  03/08/2016 at Unknown time  . guaiFENesin (MUCINEX) 600 MG 12 hr tablet Take 2 tablets (1,200 mg total) by mouth 2 (two) times daily. 60 tablet 5 03/09/2016 at morning  . ipratropium (ATROVENT) 0.02 % nebulizer solution Take 500 mcg by nebulization 3 (three) times daily.   03/09/2016 at Unknown time  . pantoprazole (PROTONIX) 40 MG tablet Take 1 tablet (40 mg total) by mouth 2 (two) times daily. (Patient taking differently: Take 40 mg by mouth daily. ) 60 tablet 5 03/09/2016 at Unknown time  . Fluticasone-Salmeterol (ADVAIR) 250-50 MCG/DOSE AEPB Inhale 1 puff into the lungs every 12 (twelve) hours.   HOLD  . levofloxacin (LEVAQUIN) 500 MG tablet Take 1 tablet (500 mg total) by mouth  daily. 5 tablet 1   . predniSONE (STERAPRED UNI-PAK 21 TAB) 10 MG (21) TBPK tablet Take 1 tablet (10 mg total) by mouth daily. (Patient not taking: Reported on 03/09/2016) 21 tablet 1 Not Taking at Unknown time   Scheduled: . aspirin EC  81 mg Oral Daily  . enoxaparin (LOVENOX) injection  40 mg Subcutaneous Q24H  . fluocinonide cream   Topical BID  . fluticasone furoate-vilanterol  1 puff Inhalation Daily  . guaiFENesin  1,200 mg Oral BID  . ipratropium-albuterol  3 mL Nebulization Q4H  . levofloxacin  750 mg Oral Daily  . pantoprazole  40 mg Oral Daily  . potassium chloride  20 mEq Oral BID  . predniSONE  40 mg Oral Q breakfast  . sertraline  50 mg Oral Daily  . sodium chloride flush  3 mL Intravenous Q12H   Continuous:  FRT:MYTRZN chloride, acetaminophen **OR** acetaminophen, diphenhydrAMINE, ondansetron **OR** ondansetron (ZOFRAN) IV, sodium chloride flush, traMADol  Assesment: She was admitted with acute on chronic respiratory failure COPD exacerbation hypokalemia. She's doing better but is very weak and deconditioned and I think it's appropriate for her to go to skilled care for rehabilitation. Active Problems:   Acute-on-chronic respiratory failure (HCC)   Polycythemia   Reflux esophagitis   Hemochromatosis   COPD exacerbation ([place])   Acute hypokalemia    Plan: Discharge to skilled care facility for rehabilitation    LOS: 6 days   Jabir Dahlem L 03/15/2016, 8:36 AM

## 2016-03-15 NOTE — Progress Notes (Signed)
Report called to [facility] in [place].

## 2016-03-15 NOTE — Clinical Social Work Placement (Signed)
   CLINICAL SOCIAL WORK PLACEMENT  NOTE  Date:  03/15/2016  Patient Details  Name: Leah Dalton MRN: [id number] Date of Birth: 25-Oct-1945  Clinical Social Work is seeking post-discharge placement for this patient at the [place] level of care (*CSW will initial, date and re-position this form in  chart as items are completed):  Yes   Patient/family provided with [place] Work Department's list of facilities offering this level of care within the geographic area requested by the patient (or if unable, by the patient's family).  Yes   Patient/family informed of their freedom to choose among providers that offer the needed level of care, that participate in Medicare, Medicaid or managed care program needed by the patient, have an available bed and are willing to accept the patient.  Yes   Patient/family informed of [facility]'s ownership interest in [facility] and [facility], as well as of the fact that they are under no obligation to receive care at these facilities.  PASRR submitted to EDS on 03/14/16     PASRR number received on 03/14/16     Existing PASRR number confirmed on       FL2 transmitted to all facilities in geographic area requested by pt/family on 03/15/16     FL2 transmitted to all facilities within larger geographic area on       Patient informed that his/her managed care company has contracts with or will negotiate with certain facilities, including the following:        Yes   Patient/family informed of bed offers received.  Patient chooses bed at [facility]     Physician recommends and patient chooses bed at      Patient to be transferred to [facility] on 03/15/16.  Patient to be transferred to facility by [facility]     Patient family notified on 03/15/16 of transfer.  Name of family member notified:  pt states she will notify her son.      PHYSICIAN       Additional  Comment:    _______________________________________________ [name], [place] 03/15/2016, 8:51 AM [phone]

## 2016-03-17 DIAGNOSIS — R609 Edema, unspecified: Secondary | ICD-10-CM | POA: Diagnosis not present

## 2016-03-17 DIAGNOSIS — L853 Xerosis cutis: Secondary | ICD-10-CM | POA: Diagnosis not present

## 2016-03-17 DIAGNOSIS — E876 Hypokalemia: Secondary | ICD-10-CM | POA: Diagnosis not present

## 2016-03-17 DIAGNOSIS — K219 Gastro-esophageal reflux disease without esophagitis: Secondary | ICD-10-CM | POA: Diagnosis not present

## 2016-03-17 DIAGNOSIS — I10 Essential (primary) hypertension: Secondary | ICD-10-CM | POA: Diagnosis not present

## 2016-03-17 DIAGNOSIS — J449 Chronic obstructive pulmonary disease, unspecified: Secondary | ICD-10-CM | POA: Diagnosis not present

## 2016-03-17 DIAGNOSIS — M6281 Muscle weakness (generalized): Secondary | ICD-10-CM | POA: Diagnosis not present

## 2016-03-17 DIAGNOSIS — L299 Pruritus, unspecified: Secondary | ICD-10-CM | POA: Diagnosis not present

## 2016-03-22 DIAGNOSIS — I10 Essential (primary) hypertension: Secondary | ICD-10-CM | POA: Diagnosis not present

## 2016-03-22 DIAGNOSIS — L853 Xerosis cutis: Secondary | ICD-10-CM | POA: Diagnosis not present

## 2016-03-22 DIAGNOSIS — J449 Chronic obstructive pulmonary disease, unspecified: Secondary | ICD-10-CM | POA: Diagnosis not present

## 2016-03-22 DIAGNOSIS — L299 Pruritus, unspecified: Secondary | ICD-10-CM | POA: Diagnosis not present

## 2016-03-22 DIAGNOSIS — E876 Hypokalemia: Secondary | ICD-10-CM | POA: Diagnosis not present

## 2016-03-22 DIAGNOSIS — M6281 Muscle weakness (generalized): Secondary | ICD-10-CM | POA: Diagnosis not present

## 2016-03-22 DIAGNOSIS — K219 Gastro-esophageal reflux disease without esophagitis: Secondary | ICD-10-CM | POA: Diagnosis not present

## 2016-03-22 DIAGNOSIS — R609 Edema, unspecified: Secondary | ICD-10-CM | POA: Diagnosis not present

## 2016-03-24 DIAGNOSIS — L853 Xerosis cutis: Secondary | ICD-10-CM | POA: Diagnosis not present

## 2016-03-24 DIAGNOSIS — R609 Edema, unspecified: Secondary | ICD-10-CM | POA: Diagnosis not present

## 2016-03-24 DIAGNOSIS — E876 Hypokalemia: Secondary | ICD-10-CM | POA: Diagnosis not present

## 2016-03-24 DIAGNOSIS — J449 Chronic obstructive pulmonary disease, unspecified: Secondary | ICD-10-CM | POA: Diagnosis not present

## 2016-03-24 DIAGNOSIS — M6281 Muscle weakness (generalized): Secondary | ICD-10-CM | POA: Diagnosis not present

## 2016-03-24 DIAGNOSIS — I10 Essential (primary) hypertension: Secondary | ICD-10-CM | POA: Diagnosis not present

## 2016-03-24 DIAGNOSIS — L299 Pruritus, unspecified: Secondary | ICD-10-CM | POA: Diagnosis not present

## 2016-03-24 DIAGNOSIS — K219 Gastro-esophageal reflux disease without esophagitis: Secondary | ICD-10-CM | POA: Diagnosis not present

## 2016-03-28 DIAGNOSIS — I10 Essential (primary) hypertension: Secondary | ICD-10-CM | POA: Diagnosis not present

## 2016-03-28 DIAGNOSIS — L853 Xerosis cutis: Secondary | ICD-10-CM | POA: Diagnosis not present

## 2016-03-28 DIAGNOSIS — M810 Age-related osteoporosis without current pathological fracture: Secondary | ICD-10-CM | POA: Diagnosis not present

## 2016-03-28 DIAGNOSIS — E559 Vitamin D deficiency, unspecified: Secondary | ICD-10-CM | POA: Diagnosis not present

## 2016-03-28 DIAGNOSIS — J449 Chronic obstructive pulmonary disease, unspecified: Secondary | ICD-10-CM | POA: Diagnosis not present

## 2016-03-28 DIAGNOSIS — E871 Hypo-osmolality and hyponatremia: Secondary | ICD-10-CM | POA: Diagnosis not present

## 2016-03-28 DIAGNOSIS — M6281 Muscle weakness (generalized): Secondary | ICD-10-CM | POA: Diagnosis not present

## 2016-03-28 DIAGNOSIS — E876 Hypokalemia: Secondary | ICD-10-CM | POA: Diagnosis not present

## 2016-03-28 DIAGNOSIS — F331 Major depressive disorder, recurrent, moderate: Secondary | ICD-10-CM | POA: Diagnosis not present

## 2016-03-28 DIAGNOSIS — R609 Edema, unspecified: Secondary | ICD-10-CM | POA: Diagnosis not present

## 2016-03-28 DIAGNOSIS — K219 Gastro-esophageal reflux disease without esophagitis: Secondary | ICD-10-CM | POA: Diagnosis not present

## 2016-03-28 DIAGNOSIS — L299 Pruritus, unspecified: Secondary | ICD-10-CM | POA: Diagnosis not present

## 2016-03-29 DIAGNOSIS — J449 Chronic obstructive pulmonary disease, unspecified: Secondary | ICD-10-CM | POA: Diagnosis not present

## 2016-03-29 DIAGNOSIS — M810 Age-related osteoporosis without current pathological fracture: Secondary | ICD-10-CM | POA: Diagnosis not present

## 2016-03-29 DIAGNOSIS — K219 Gastro-esophageal reflux disease without esophagitis: Secondary | ICD-10-CM | POA: Diagnosis not present

## 2016-03-29 DIAGNOSIS — L853 Xerosis cutis: Secondary | ICD-10-CM | POA: Diagnosis not present

## 2016-03-29 DIAGNOSIS — I10 Essential (primary) hypertension: Secondary | ICD-10-CM | POA: Diagnosis not present

## 2016-03-29 DIAGNOSIS — E559 Vitamin D deficiency, unspecified: Secondary | ICD-10-CM | POA: Diagnosis not present

## 2016-03-29 DIAGNOSIS — M6281 Muscle weakness (generalized): Secondary | ICD-10-CM | POA: Diagnosis not present

## 2016-03-29 DIAGNOSIS — E876 Hypokalemia: Secondary | ICD-10-CM | POA: Diagnosis not present

## 2016-03-29 DIAGNOSIS — E871 Hypo-osmolality and hyponatremia: Secondary | ICD-10-CM | POA: Diagnosis not present

## 2016-03-29 DIAGNOSIS — R609 Edema, unspecified: Secondary | ICD-10-CM | POA: Diagnosis not present

## 2016-03-29 DIAGNOSIS — F331 Major depressive disorder, recurrent, moderate: Secondary | ICD-10-CM | POA: Diagnosis not present

## 2016-03-29 DIAGNOSIS — L299 Pruritus, unspecified: Secondary | ICD-10-CM | POA: Diagnosis not present

## 2016-03-30 ENCOUNTER — Encounter: Payer: Self-pay | Admitting: Internal Medicine

## 2016-04-04 DIAGNOSIS — I10 Essential (primary) hypertension: Secondary | ICD-10-CM | POA: Diagnosis not present

## 2016-04-04 DIAGNOSIS — E876 Hypokalemia: Secondary | ICD-10-CM | POA: Diagnosis not present

## 2016-04-04 DIAGNOSIS — K219 Gastro-esophageal reflux disease without esophagitis: Secondary | ICD-10-CM | POA: Diagnosis not present

## 2016-04-04 DIAGNOSIS — L853 Xerosis cutis: Secondary | ICD-10-CM | POA: Diagnosis not present

## 2016-04-04 DIAGNOSIS — J449 Chronic obstructive pulmonary disease, unspecified: Secondary | ICD-10-CM | POA: Diagnosis not present

## 2016-04-04 DIAGNOSIS — R609 Edema, unspecified: Secondary | ICD-10-CM | POA: Diagnosis not present

## 2016-04-04 DIAGNOSIS — E559 Vitamin D deficiency, unspecified: Secondary | ICD-10-CM | POA: Diagnosis not present

## 2016-04-04 DIAGNOSIS — M810 Age-related osteoporosis without current pathological fracture: Secondary | ICD-10-CM | POA: Diagnosis not present

## 2016-04-04 DIAGNOSIS — F331 Major depressive disorder, recurrent, moderate: Secondary | ICD-10-CM | POA: Diagnosis not present

## 2016-04-04 DIAGNOSIS — E871 Hypo-osmolality and hyponatremia: Secondary | ICD-10-CM | POA: Diagnosis not present

## 2016-04-04 DIAGNOSIS — M6281 Muscle weakness (generalized): Secondary | ICD-10-CM | POA: Diagnosis not present

## 2016-04-04 DIAGNOSIS — L299 Pruritus, unspecified: Secondary | ICD-10-CM | POA: Diagnosis not present

## 2016-04-11 DIAGNOSIS — E559 Vitamin D deficiency, unspecified: Secondary | ICD-10-CM | POA: Diagnosis not present

## 2016-04-11 DIAGNOSIS — F331 Major depressive disorder, recurrent, moderate: Secondary | ICD-10-CM | POA: Diagnosis not present

## 2016-04-11 DIAGNOSIS — I10 Essential (primary) hypertension: Secondary | ICD-10-CM | POA: Diagnosis not present

## 2016-04-11 DIAGNOSIS — E871 Hypo-osmolality and hyponatremia: Secondary | ICD-10-CM | POA: Diagnosis not present

## 2016-04-11 DIAGNOSIS — M6281 Muscle weakness (generalized): Secondary | ICD-10-CM | POA: Diagnosis not present

## 2016-04-11 DIAGNOSIS — R609 Edema, unspecified: Secondary | ICD-10-CM | POA: Diagnosis not present

## 2016-04-11 DIAGNOSIS — L299 Pruritus, unspecified: Secondary | ICD-10-CM | POA: Diagnosis not present

## 2016-04-11 DIAGNOSIS — E876 Hypokalemia: Secondary | ICD-10-CM | POA: Diagnosis not present

## 2016-04-11 DIAGNOSIS — L853 Xerosis cutis: Secondary | ICD-10-CM | POA: Diagnosis not present

## 2016-04-11 DIAGNOSIS — K219 Gastro-esophageal reflux disease without esophagitis: Secondary | ICD-10-CM | POA: Diagnosis not present

## 2016-04-11 DIAGNOSIS — J449 Chronic obstructive pulmonary disease, unspecified: Secondary | ICD-10-CM | POA: Diagnosis not present

## 2016-04-11 DIAGNOSIS — M810 Age-related osteoporosis without current pathological fracture: Secondary | ICD-10-CM | POA: Diagnosis not present

## 2016-04-12 DIAGNOSIS — J440 Chronic obstructive pulmonary disease with acute lower respiratory infection: Secondary | ICD-10-CM | POA: Diagnosis present

## 2016-04-12 DIAGNOSIS — E8809 Other disorders of plasma-protein metabolism, not elsewhere classified: Secondary | ICD-10-CM | POA: Diagnosis present

## 2016-04-12 DIAGNOSIS — A419 Sepsis, unspecified organism: Secondary | ICD-10-CM | POA: Diagnosis present

## 2016-04-12 DIAGNOSIS — D649 Anemia, unspecified: Secondary | ICD-10-CM | POA: Diagnosis present

## 2016-04-12 DIAGNOSIS — E440 Moderate protein-calorie malnutrition: Secondary | ICD-10-CM | POA: Diagnosis present

## 2016-04-12 DIAGNOSIS — E878 Other disorders of electrolyte and fluid balance, not elsewhere classified: Secondary | ICD-10-CM | POA: Diagnosis present

## 2016-04-12 DIAGNOSIS — Z885 Allergy status to narcotic agent status: Secondary | ICD-10-CM | POA: Diagnosis not present

## 2016-04-12 DIAGNOSIS — Z881 Allergy status to other antibiotic agents status: Secondary | ICD-10-CM | POA: Diagnosis not present

## 2016-04-12 DIAGNOSIS — I10 Essential (primary) hypertension: Secondary | ICD-10-CM | POA: Diagnosis present

## 2016-04-12 DIAGNOSIS — K219 Gastro-esophageal reflux disease without esophagitis: Secondary | ICD-10-CM | POA: Diagnosis present

## 2016-04-12 DIAGNOSIS — Z87898 Personal history of other specified conditions: Secondary | ICD-10-CM | POA: Diagnosis not present

## 2016-04-12 DIAGNOSIS — J9621 Acute and chronic respiratory failure with hypoxia: Secondary | ICD-10-CM | POA: Diagnosis present

## 2016-04-12 DIAGNOSIS — J441 Chronic obstructive pulmonary disease with (acute) exacerbation: Secondary | ICD-10-CM | POA: Diagnosis present

## 2016-04-12 DIAGNOSIS — Z880 Allergy status to penicillin: Secondary | ICD-10-CM | POA: Diagnosis not present

## 2016-04-12 DIAGNOSIS — Z87891 Personal history of nicotine dependence: Secondary | ICD-10-CM | POA: Diagnosis not present

## 2016-04-12 DIAGNOSIS — R6520 Severe sepsis without septic shock: Secondary | ICD-10-CM | POA: Diagnosis present

## 2016-04-12 DIAGNOSIS — E876 Hypokalemia: Secondary | ICD-10-CM | POA: Diagnosis present

## 2016-04-12 DIAGNOSIS — J189 Pneumonia, unspecified organism: Secondary | ICD-10-CM | POA: Diagnosis not present

## 2016-04-12 DIAGNOSIS — Z888 Allergy status to other drugs, medicaments and biological substances status: Secondary | ICD-10-CM | POA: Diagnosis not present

## 2016-04-12 DIAGNOSIS — J9622 Acute and chronic respiratory failure with hypercapnia: Secondary | ICD-10-CM | POA: Diagnosis present

## 2016-04-12 DIAGNOSIS — E871 Hypo-osmolality and hyponatremia: Secondary | ICD-10-CM | POA: Diagnosis present

## 2016-04-12 DIAGNOSIS — D473 Essential (hemorrhagic) thrombocythemia: Secondary | ICD-10-CM | POA: Diagnosis present

## 2016-04-12 DIAGNOSIS — Z90710 Acquired absence of both cervix and uterus: Secondary | ICD-10-CM | POA: Diagnosis not present

## 2016-04-12 DIAGNOSIS — Z6826 Body mass index (BMI) 26.0-26.9, adult: Secondary | ICD-10-CM | POA: Diagnosis not present

## 2016-04-12 DIAGNOSIS — F329 Major depressive disorder, single episode, unspecified: Secondary | ICD-10-CM | POA: Diagnosis present

## 2016-04-12 DIAGNOSIS — R0902 Hypoxemia: Secondary | ICD-10-CM | POA: Diagnosis not present

## 2016-04-19 ENCOUNTER — Other Ambulatory Visit (HOSPITAL_COMMUNITY): Payer: Medicare Other

## 2016-04-19 ENCOUNTER — Inpatient Hospital Stay
Admission: RE | Admit: 2016-04-19 | Discharge: 2016-05-08 | Disposition: A | Discharge status: Skilled Nursing Facility | Payer: Medicare Other | Attending: Internal Medicine | Admitting: Internal Medicine

## 2016-04-19 DIAGNOSIS — Z9911 Dependence on respirator [ventilator] status: Secondary | ICD-10-CM | POA: Diagnosis not present

## 2016-04-19 DIAGNOSIS — D72829 Elevated white blood cell count, unspecified: Secondary | ICD-10-CM | POA: Diagnosis not present

## 2016-04-19 DIAGNOSIS — E42 Marasmic kwashiorkor: Secondary | ICD-10-CM | POA: Diagnosis not present

## 2016-04-19 DIAGNOSIS — Z452 Encounter for adjustment and management of vascular access device: Secondary | ICD-10-CM

## 2016-04-19 DIAGNOSIS — D509 Iron deficiency anemia, unspecified: Secondary | ICD-10-CM | POA: Diagnosis not present

## 2016-04-19 DIAGNOSIS — D649 Anemia, unspecified: Secondary | ICD-10-CM | POA: Diagnosis present

## 2016-04-19 DIAGNOSIS — J9690 Respiratory failure, unspecified, unspecified whether with hypoxia or hypercapnia: Secondary | ICD-10-CM | POA: Diagnosis not present

## 2016-04-19 DIAGNOSIS — Z6828 Body mass index (BMI) 28.0-28.9, adult: Secondary | ICD-10-CM | POA: Diagnosis not present

## 2016-04-19 DIAGNOSIS — Z743 Need for continuous supervision: Secondary | ICD-10-CM | POA: Diagnosis not present

## 2016-04-19 DIAGNOSIS — E440 Moderate protein-calorie malnutrition: Secondary | ICD-10-CM | POA: Diagnosis present

## 2016-04-19 DIAGNOSIS — R531 Weakness: Secondary | ICD-10-CM | POA: Diagnosis not present

## 2016-04-19 DIAGNOSIS — F329 Major depressive disorder, single episode, unspecified: Secondary | ICD-10-CM | POA: Diagnosis present

## 2016-04-19 DIAGNOSIS — B9562 Methicillin resistant Staphylococcus aureus infection as the cause of diseases classified elsewhere: Secondary | ICD-10-CM | POA: Diagnosis not present

## 2016-04-19 DIAGNOSIS — J9620 Acute and chronic respiratory failure, unspecified whether with hypoxia or hypercapnia: Secondary | ICD-10-CM | POA: Diagnosis present

## 2016-04-19 DIAGNOSIS — R2681 Unsteadiness on feet: Secondary | ICD-10-CM | POA: Diagnosis not present

## 2016-04-19 DIAGNOSIS — F339 Major depressive disorder, recurrent, unspecified: Secondary | ICD-10-CM | POA: Diagnosis not present

## 2016-04-19 DIAGNOSIS — J9610 Chronic respiratory failure, unspecified whether with hypoxia or hypercapnia: Secondary | ICD-10-CM | POA: Diagnosis not present

## 2016-04-19 DIAGNOSIS — R278 Other lack of coordination: Secondary | ICD-10-CM | POA: Diagnosis not present

## 2016-04-19 DIAGNOSIS — J449 Chronic obstructive pulmonary disease, unspecified: Secondary | ICD-10-CM | POA: Diagnosis not present

## 2016-04-19 DIAGNOSIS — R918 Other nonspecific abnormal finding of lung field: Secondary | ICD-10-CM | POA: Diagnosis not present

## 2016-04-19 DIAGNOSIS — Z931 Gastrostomy status: Secondary | ICD-10-CM

## 2016-04-19 DIAGNOSIS — J159 Unspecified bacterial pneumonia: Secondary | ICD-10-CM | POA: Diagnosis not present

## 2016-04-19 DIAGNOSIS — E46 Unspecified protein-calorie malnutrition: Secondary | ICD-10-CM | POA: Diagnosis not present

## 2016-04-19 DIAGNOSIS — D751 Secondary polycythemia: Secondary | ICD-10-CM | POA: Diagnosis not present

## 2016-04-19 DIAGNOSIS — R1319 Other dysphagia: Secondary | ICD-10-CM | POA: Diagnosis not present

## 2016-04-19 DIAGNOSIS — I10 Essential (primary) hypertension: Secondary | ICD-10-CM | POA: Diagnosis not present

## 2016-04-19 DIAGNOSIS — Z741 Need for assistance with personal care: Secondary | ICD-10-CM | POA: Diagnosis not present

## 2016-04-19 DIAGNOSIS — A419 Sepsis, unspecified organism: Secondary | ICD-10-CM | POA: Diagnosis not present

## 2016-04-19 DIAGNOSIS — Z87891 Personal history of nicotine dependence: Secondary | ICD-10-CM | POA: Diagnosis not present

## 2016-04-19 DIAGNOSIS — J441 Chronic obstructive pulmonary disease with (acute) exacerbation: Secondary | ICD-10-CM | POA: Diagnosis not present

## 2016-04-19 DIAGNOSIS — J189 Pneumonia, unspecified organism: Secondary | ICD-10-CM | POA: Diagnosis not present

## 2016-04-19 DIAGNOSIS — K219 Gastro-esophageal reflux disease without esophagitis: Secondary | ICD-10-CM | POA: Diagnosis present

## 2016-04-19 DIAGNOSIS — J15212 Pneumonia due to Methicillin resistant Staphylococcus aureus: Secondary | ICD-10-CM | POA: Diagnosis not present

## 2016-04-19 DIAGNOSIS — R279 Unspecified lack of coordination: Secondary | ICD-10-CM | POA: Diagnosis not present

## 2016-04-19 DIAGNOSIS — E43 Unspecified severe protein-calorie malnutrition: Secondary | ICD-10-CM | POA: Diagnosis not present

## 2016-04-19 DIAGNOSIS — J9621 Acute and chronic respiratory failure with hypoxia: Secondary | ICD-10-CM | POA: Diagnosis not present

## 2016-04-19 DIAGNOSIS — M6281 Muscle weakness (generalized): Secondary | ICD-10-CM | POA: Diagnosis not present

## 2016-04-19 DIAGNOSIS — E876 Hypokalemia: Secondary | ICD-10-CM | POA: Diagnosis not present

## 2016-04-20 ENCOUNTER — Other Ambulatory Visit (HOSPITAL_COMMUNITY): Payer: Medicare Other

## 2016-04-20 DIAGNOSIS — J9690 Respiratory failure, unspecified, unspecified whether with hypoxia or hypercapnia: Secondary | ICD-10-CM | POA: Diagnosis not present

## 2016-04-20 LAB — COMPREHENSIVE METABOLIC PANEL
ALK PHOS: 47 U/L (ref 38–126)
ALT: 14 U/L (ref 14–54)
ANION GAP: 10 (ref 5–15)
AST: 16 U/L (ref 15–41)
Albumin: 2.6 g/dL — ABNORMAL LOW (ref 3.5–5.0)
BILIRUBIN TOTAL: 1.1 mg/dL (ref 0.3–1.2)
BUN: 6 mg/dL (ref 6–20)
CALCIUM: 8.9 mg/dL (ref 8.9–10.3)
CO2: 35 mmol/L — ABNORMAL HIGH (ref 22–32)
Chloride: 91 mmol/L — ABNORMAL LOW (ref 101–111)
Creatinine, Ser: 0.43 mg/dL — ABNORMAL LOW (ref 0.44–1.00)
GFR calc Af Amer: >60 mL/min (ref >60)
GFR calc non Af Amer: >60 mL/min (ref >60)
Glucose, Bld: 156 mg/dL — ABNORMAL HIGH (ref 65–99)
Potassium: 3.4 mmol/L — ABNORMAL LOW (ref 3.5–5.1)
Sodium: 136 mmol/L (ref 135–145)
TOTAL PROTEIN: 5.2 g/dL — AB (ref 6.5–8.1)

## 2016-04-20 LAB — CBC WITH DIFFERENTIAL/PLATELET
BASOS ABS: 0 10*3/uL (ref 0.0–0.1)
Basophils Relative: 0 %
EOS ABS: 0 10*3/uL (ref 0.0–0.7)
Eosinophils Relative: 0 %
HEMATOCRIT: 32.7 % — AB (ref 36.0–46.0)
Hemoglobin: 10.9 g/dL — ABNORMAL LOW (ref 12.0–15.0)
LYMPHS ABS: 0.5 10*3/uL — AB (ref 0.7–4.0)
Lymphocytes Relative: 3 %
MCH: 30.4 pg (ref 26.0–34.0)
MCHC: 33.3 g/dL (ref 30.0–36.0)
MCV: 91.1 fL (ref 78.0–100.0)
Monocytes Absolute: 0.3 10*3/uL (ref 0.1–1.0)
Monocytes Relative: 2 %
NEUTROS ABS: 16.1 10*3/uL — AB (ref 1.7–7.7)
Neutrophils Relative %: 95 %
Platelets: 278 10*3/uL (ref 150–400)
RBC: 3.59 MIL/uL — ABNORMAL LOW (ref 3.87–5.11)
RDW: 14.5 % (ref 11.5–15.5)
WBC: 16.9 10*3/uL — ABNORMAL HIGH (ref 4.0–10.5)

## 2016-04-20 LAB — VANCOMYCIN, RANDOM: Vancomycin Rm: 6

## 2016-04-20 LAB — PHOSPHORUS: Phosphorus: 3.8 mg/dL (ref 2.5–4.6)

## 2016-04-20 LAB — PROTIME-INR
INR: 1.02
PROTHROMBIN TIME: 13.4 s (ref 11.4–15.2)

## 2016-04-20 LAB — TSH: TSH: 0.913 u[IU]/mL (ref 0.350–4.500)

## 2016-04-20 LAB — MAGNESIUM: Magnesium: 1.8 mg/dL (ref 1.7–2.4)

## 2016-04-21 LAB — VANCOMYCIN, TROUGH: Vancomycin Tr: 26 ug/mL (ref 15–20)

## 2016-04-21 LAB — BASIC METABOLIC PANEL
ANION GAP: 9 (ref 5–15)
BUN: 10 mg/dL (ref 6–20)
CO2: 34 mmol/L — AB (ref 22–32)
Calcium: 9 mg/dL (ref 8.9–10.3)
Chloride: 93 mmol/L — ABNORMAL LOW (ref 101–111)
Creatinine, Ser: 0.37 mg/dL — ABNORMAL LOW (ref 0.44–1.00)
GFR calc Af Amer: >60 mL/min (ref >60)
GFR calc non Af Amer: >60 mL/min (ref >60)
GLUCOSE: 205 mg/dL — AB (ref 65–99)
POTASSIUM: 3.7 mmol/L (ref 3.5–5.1)
Sodium: 136 mmol/L (ref 135–145)

## 2016-04-21 LAB — HEMOGLOBIN A1C
HEMOGLOBIN A1C: 5.7 % — AB (ref 4.8–5.6)
Mean Plasma Glucose: 117 mg/dL

## 2016-04-23 LAB — VANCOMYCIN, TROUGH: VANCOMYCIN TR: 19 ug/mL (ref 15–20)

## 2016-04-25 LAB — PROTIME-INR
INR: 1
Prothrombin Time: 13.2 seconds (ref 11.4–15.2)

## 2016-04-25 LAB — VANCOMYCIN, TROUGH: VANCOMYCIN TR: 26 ug/mL — AB (ref 15–20)

## 2016-04-26 ENCOUNTER — Other Ambulatory Visit (HOSPITAL_COMMUNITY): Payer: Medicare Other

## 2016-04-28 LAB — BASIC METABOLIC PANEL
ANION GAP: 12 (ref 5–15)
Anion gap: 13 (ref 5–15)
BUN: 13 mg/dL (ref 6–20)
BUN: 14 mg/dL (ref 6–20)
CO2: 36 mmol/L — AB (ref 22–32)
CO2: 35 mmol/L — AB (ref 22–32)
Calcium: 9.1 mg/dL (ref 8.9–10.3)
Calcium: 9.3 mg/dL (ref 8.9–10.3)
Chloride: 88 mmol/L — ABNORMAL LOW (ref 101–111)
Chloride: 88 mmol/L — ABNORMAL LOW (ref 101–111)
Creatinine, Ser: 0.41 mg/dL — ABNORMAL LOW (ref 0.44–1.00)
Creatinine, Ser: 0.46 mg/dL (ref 0.44–1.00)
GFR calc Af Amer: >60 mL/min (ref >60)
GFR calc Af Amer: >60 mL/min (ref >60)
GFR calc non Af Amer: >60 mL/min (ref >60)
GFR calc non Af Amer: >60 mL/min (ref >60)
GLUCOSE: 166 mg/dL — AB (ref 65–99)
Glucose, Bld: 239 mg/dL — ABNORMAL HIGH (ref 65–99)
POTASSIUM: 2.5 mmol/L — AB (ref 3.5–5.1)
POTASSIUM: 3.1 mmol/L — AB (ref 3.5–5.1)
Sodium: 136 mmol/L (ref 135–145)
Sodium: 136 mmol/L (ref 135–145)

## 2016-04-29 LAB — POTASSIUM: POTASSIUM: 4.3 mmol/L (ref 3.5–5.1)

## 2016-05-02 LAB — BASIC METABOLIC PANEL
ANION GAP: 10 (ref 5–15)
BUN: 12 mg/dL (ref 6–20)
CALCIUM: 8.6 mg/dL — AB (ref 8.9–10.3)
CO2: 38 mmol/L — AB (ref 22–32)
Chloride: 85 mmol/L — ABNORMAL LOW (ref 101–111)
Creatinine, Ser: 0.39 mg/dL — ABNORMAL LOW (ref 0.44–1.00)
GFR calc Af Amer: >60 mL/min (ref >60)
GFR calc non Af Amer: >60 mL/min (ref >60)
GLUCOSE: 120 mg/dL — AB (ref 65–99)
Potassium: 3 mmol/L — ABNORMAL LOW (ref 3.5–5.1)
Sodium: 133 mmol/L — ABNORMAL LOW (ref 135–145)

## 2016-05-02 LAB — CBC
HEMATOCRIT: 33.4 % — AB (ref 36.0–46.0)
Hemoglobin: 11.1 g/dL — ABNORMAL LOW (ref 12.0–15.0)
MCH: 31.1 pg (ref 26.0–34.0)
MCHC: 33.2 g/dL (ref 30.0–36.0)
MCV: 93.6 fL (ref 78.0–100.0)
Platelets: 185 10*3/uL (ref 150–400)
RBC: 3.57 MIL/uL — ABNORMAL LOW (ref 3.87–5.11)
RDW: 16.9 % — ABNORMAL HIGH (ref 11.5–15.5)
WBC: 13.6 10*3/uL — AB (ref 4.0–10.5)

## 2016-05-03 LAB — POTASSIUM: Potassium: 3.5 mmol/L (ref 3.5–5.1)

## 2016-05-08 DIAGNOSIS — R918 Other nonspecific abnormal finding of lung field: Secondary | ICD-10-CM | POA: Diagnosis not present

## 2016-05-08 DIAGNOSIS — G932 Benign intracranial hypertension: Secondary | ICD-10-CM | POA: Diagnosis not present

## 2016-05-08 DIAGNOSIS — M6281 Muscle weakness (generalized): Secondary | ICD-10-CM | POA: Diagnosis not present

## 2016-05-08 DIAGNOSIS — Z79899 Other long term (current) drug therapy: Secondary | ICD-10-CM | POA: Diagnosis not present

## 2016-05-08 DIAGNOSIS — J449 Chronic obstructive pulmonary disease, unspecified: Secondary | ICD-10-CM | POA: Diagnosis not present

## 2016-05-08 DIAGNOSIS — F064 Anxiety disorder due to known physiological condition: Secondary | ICD-10-CM | POA: Diagnosis not present

## 2016-05-08 DIAGNOSIS — J441 Chronic obstructive pulmonary disease with (acute) exacerbation: Secondary | ICD-10-CM | POA: Diagnosis not present

## 2016-05-08 DIAGNOSIS — F331 Major depressive disorder, recurrent, moderate: Secondary | ICD-10-CM | POA: Diagnosis not present

## 2016-05-08 DIAGNOSIS — L989 Disorder of the skin and subcutaneous tissue, unspecified: Secondary | ICD-10-CM | POA: Diagnosis not present

## 2016-05-08 DIAGNOSIS — R2681 Unsteadiness on feet: Secondary | ICD-10-CM | POA: Diagnosis not present

## 2016-05-08 DIAGNOSIS — S51801A Unspecified open wound of right forearm, initial encounter: Secondary | ICD-10-CM | POA: Diagnosis not present

## 2016-05-08 DIAGNOSIS — E876 Hypokalemia: Secondary | ICD-10-CM | POA: Diagnosis not present

## 2016-05-08 DIAGNOSIS — J159 Unspecified bacterial pneumonia: Secondary | ICD-10-CM | POA: Diagnosis not present

## 2016-05-08 DIAGNOSIS — J9621 Acute and chronic respiratory failure with hypoxia: Secondary | ICD-10-CM | POA: Diagnosis not present

## 2016-05-08 DIAGNOSIS — K219 Gastro-esophageal reflux disease without esophagitis: Secondary | ICD-10-CM | POA: Diagnosis not present

## 2016-05-08 DIAGNOSIS — D509 Iron deficiency anemia, unspecified: Secondary | ICD-10-CM | POA: Diagnosis not present

## 2016-05-08 DIAGNOSIS — E871 Hypo-osmolality and hyponatremia: Secondary | ICD-10-CM | POA: Diagnosis not present

## 2016-05-08 DIAGNOSIS — E46 Unspecified protein-calorie malnutrition: Secondary | ICD-10-CM | POA: Diagnosis not present

## 2016-05-08 DIAGNOSIS — R5381 Other malaise: Secondary | ICD-10-CM | POA: Diagnosis not present

## 2016-05-08 DIAGNOSIS — M810 Age-related osteoporosis without current pathological fracture: Secondary | ICD-10-CM | POA: Diagnosis not present

## 2016-05-08 DIAGNOSIS — R278 Other lack of coordination: Secondary | ICD-10-CM | POA: Diagnosis not present

## 2016-05-08 DIAGNOSIS — F17211 Nicotine dependence, cigarettes, in remission: Secondary | ICD-10-CM | POA: Diagnosis not present

## 2016-05-08 DIAGNOSIS — R531 Weakness: Secondary | ICD-10-CM | POA: Diagnosis not present

## 2016-05-08 DIAGNOSIS — Z741 Need for assistance with personal care: Secondary | ICD-10-CM | POA: Diagnosis not present

## 2016-05-08 DIAGNOSIS — L03113 Cellulitis of right upper limb: Secondary | ICD-10-CM | POA: Diagnosis not present

## 2016-05-08 DIAGNOSIS — D751 Secondary polycythemia: Secondary | ICD-10-CM | POA: Diagnosis not present

## 2016-05-08 DIAGNOSIS — R0989 Other specified symptoms and signs involving the circulatory and respiratory systems: Secondary | ICD-10-CM | POA: Diagnosis not present

## 2016-05-08 DIAGNOSIS — J9610 Chronic respiratory failure, unspecified whether with hypoxia or hypercapnia: Secondary | ICD-10-CM | POA: Diagnosis not present

## 2016-05-08 DIAGNOSIS — R609 Edema, unspecified: Secondary | ICD-10-CM | POA: Diagnosis not present

## 2016-05-08 DIAGNOSIS — J189 Pneumonia, unspecified organism: Secondary | ICD-10-CM | POA: Diagnosis not present

## 2016-05-08 DIAGNOSIS — S81801D Unspecified open wound, right lower leg, subsequent encounter: Secondary | ICD-10-CM | POA: Diagnosis not present

## 2016-05-08 DIAGNOSIS — J9620 Acute and chronic respiratory failure, unspecified whether with hypoxia or hypercapnia: Secondary | ICD-10-CM | POA: Diagnosis not present

## 2016-05-08 DIAGNOSIS — E43 Unspecified severe protein-calorie malnutrition: Secondary | ICD-10-CM | POA: Diagnosis not present

## 2016-05-08 DIAGNOSIS — R1319 Other dysphagia: Secondary | ICD-10-CM | POA: Diagnosis not present

## 2016-05-08 DIAGNOSIS — I10 Essential (primary) hypertension: Secondary | ICD-10-CM | POA: Diagnosis not present

## 2016-05-08 DIAGNOSIS — J15212 Pneumonia due to Methicillin resistant Staphylococcus aureus: Secondary | ICD-10-CM | POA: Diagnosis not present

## 2016-05-08 DIAGNOSIS — R05 Cough: Secondary | ICD-10-CM | POA: Diagnosis not present

## 2016-05-08 DIAGNOSIS — K5900 Constipation, unspecified: Secondary | ICD-10-CM | POA: Diagnosis not present

## 2016-05-08 DIAGNOSIS — M7989 Other specified soft tissue disorders: Secondary | ICD-10-CM | POA: Diagnosis not present

## 2016-05-08 DIAGNOSIS — R54 Age-related physical debility: Secondary | ICD-10-CM | POA: Diagnosis not present

## 2016-05-08 DIAGNOSIS — R600 Localized edema: Secondary | ICD-10-CM | POA: Diagnosis not present

## 2016-05-08 DIAGNOSIS — F339 Major depressive disorder, recurrent, unspecified: Secondary | ICD-10-CM | POA: Diagnosis not present

## 2016-05-08 DIAGNOSIS — B9562 Methicillin resistant Staphylococcus aureus infection as the cause of diseases classified elsewhere: Secondary | ICD-10-CM | POA: Diagnosis not present

## 2016-05-09 DIAGNOSIS — R600 Localized edema: Secondary | ICD-10-CM | POA: Diagnosis not present

## 2016-05-09 DIAGNOSIS — M810 Age-related osteoporosis without current pathological fracture: Secondary | ICD-10-CM | POA: Diagnosis not present

## 2016-05-09 DIAGNOSIS — M6281 Muscle weakness (generalized): Secondary | ICD-10-CM | POA: Diagnosis not present

## 2016-05-09 DIAGNOSIS — G932 Benign intracranial hypertension: Secondary | ICD-10-CM | POA: Diagnosis not present

## 2016-05-09 DIAGNOSIS — R54 Age-related physical debility: Secondary | ICD-10-CM | POA: Diagnosis not present

## 2016-05-09 DIAGNOSIS — J441 Chronic obstructive pulmonary disease with (acute) exacerbation: Secondary | ICD-10-CM | POA: Diagnosis not present

## 2016-05-09 DIAGNOSIS — I10 Essential (primary) hypertension: Secondary | ICD-10-CM | POA: Diagnosis not present

## 2016-05-09 DIAGNOSIS — K219 Gastro-esophageal reflux disease without esophagitis: Secondary | ICD-10-CM | POA: Diagnosis not present

## 2016-05-11 DIAGNOSIS — F17211 Nicotine dependence, cigarettes, in remission: Secondary | ICD-10-CM | POA: Diagnosis not present

## 2016-05-11 DIAGNOSIS — F331 Major depressive disorder, recurrent, moderate: Secondary | ICD-10-CM | POA: Diagnosis not present

## 2016-05-15 DIAGNOSIS — K219 Gastro-esophageal reflux disease without esophagitis: Secondary | ICD-10-CM | POA: Diagnosis not present

## 2016-05-15 DIAGNOSIS — M810 Age-related osteoporosis without current pathological fracture: Secondary | ICD-10-CM | POA: Diagnosis not present

## 2016-05-15 DIAGNOSIS — I10 Essential (primary) hypertension: Secondary | ICD-10-CM | POA: Diagnosis not present

## 2016-05-15 DIAGNOSIS — M6281 Muscle weakness (generalized): Secondary | ICD-10-CM | POA: Diagnosis not present

## 2016-05-15 DIAGNOSIS — J441 Chronic obstructive pulmonary disease with (acute) exacerbation: Secondary | ICD-10-CM | POA: Diagnosis not present

## 2016-05-15 DIAGNOSIS — G932 Benign intracranial hypertension: Secondary | ICD-10-CM | POA: Diagnosis not present

## 2016-05-15 DIAGNOSIS — R600 Localized edema: Secondary | ICD-10-CM | POA: Diagnosis not present

## 2016-05-15 DIAGNOSIS — R54 Age-related physical debility: Secondary | ICD-10-CM | POA: Diagnosis not present

## 2016-05-17 DIAGNOSIS — M6281 Muscle weakness (generalized): Secondary | ICD-10-CM | POA: Diagnosis not present

## 2016-05-22 DIAGNOSIS — M6281 Muscle weakness (generalized): Secondary | ICD-10-CM | POA: Diagnosis not present

## 2016-05-22 DIAGNOSIS — R600 Localized edema: Secondary | ICD-10-CM | POA: Diagnosis not present

## 2016-05-22 DIAGNOSIS — M810 Age-related osteoporosis without current pathological fracture: Secondary | ICD-10-CM | POA: Diagnosis not present

## 2016-05-22 DIAGNOSIS — K219 Gastro-esophageal reflux disease without esophagitis: Secondary | ICD-10-CM | POA: Diagnosis not present

## 2016-05-22 DIAGNOSIS — R54 Age-related physical debility: Secondary | ICD-10-CM | POA: Diagnosis not present

## 2016-05-22 DIAGNOSIS — G932 Benign intracranial hypertension: Secondary | ICD-10-CM | POA: Diagnosis not present

## 2016-05-22 DIAGNOSIS — J441 Chronic obstructive pulmonary disease with (acute) exacerbation: Secondary | ICD-10-CM | POA: Diagnosis not present

## 2016-05-22 DIAGNOSIS — I10 Essential (primary) hypertension: Secondary | ICD-10-CM | POA: Diagnosis not present

## 2016-05-24 DIAGNOSIS — R54 Age-related physical debility: Secondary | ICD-10-CM | POA: Diagnosis not present

## 2016-05-24 DIAGNOSIS — J441 Chronic obstructive pulmonary disease with (acute) exacerbation: Secondary | ICD-10-CM | POA: Diagnosis not present

## 2016-05-24 DIAGNOSIS — R600 Localized edema: Secondary | ICD-10-CM | POA: Diagnosis not present

## 2016-05-24 DIAGNOSIS — M810 Age-related osteoporosis without current pathological fracture: Secondary | ICD-10-CM | POA: Diagnosis not present

## 2016-05-24 DIAGNOSIS — G932 Benign intracranial hypertension: Secondary | ICD-10-CM | POA: Diagnosis not present

## 2016-05-24 DIAGNOSIS — K219 Gastro-esophageal reflux disease without esophagitis: Secondary | ICD-10-CM | POA: Diagnosis not present

## 2016-05-24 DIAGNOSIS — M6281 Muscle weakness (generalized): Secondary | ICD-10-CM | POA: Diagnosis not present

## 2016-05-24 DIAGNOSIS — I10 Essential (primary) hypertension: Secondary | ICD-10-CM | POA: Diagnosis not present

## 2016-05-26 DIAGNOSIS — R5381 Other malaise: Secondary | ICD-10-CM | POA: Diagnosis not present

## 2016-05-26 DIAGNOSIS — G932 Benign intracranial hypertension: Secondary | ICD-10-CM | POA: Diagnosis not present

## 2016-05-26 DIAGNOSIS — R54 Age-related physical debility: Secondary | ICD-10-CM | POA: Diagnosis not present

## 2016-05-26 DIAGNOSIS — M6281 Muscle weakness (generalized): Secondary | ICD-10-CM | POA: Diagnosis not present

## 2016-05-26 DIAGNOSIS — K219 Gastro-esophageal reflux disease without esophagitis: Secondary | ICD-10-CM | POA: Diagnosis not present

## 2016-05-26 DIAGNOSIS — J441 Chronic obstructive pulmonary disease with (acute) exacerbation: Secondary | ICD-10-CM | POA: Diagnosis not present

## 2016-05-26 DIAGNOSIS — I10 Essential (primary) hypertension: Secondary | ICD-10-CM | POA: Diagnosis not present

## 2016-05-26 DIAGNOSIS — R600 Localized edema: Secondary | ICD-10-CM | POA: Diagnosis not present

## 2016-05-31 DIAGNOSIS — I10 Essential (primary) hypertension: Secondary | ICD-10-CM | POA: Diagnosis not present

## 2016-05-31 DIAGNOSIS — J441 Chronic obstructive pulmonary disease with (acute) exacerbation: Secondary | ICD-10-CM | POA: Diagnosis not present

## 2016-05-31 DIAGNOSIS — K219 Gastro-esophageal reflux disease without esophagitis: Secondary | ICD-10-CM | POA: Diagnosis not present

## 2016-05-31 DIAGNOSIS — M6281 Muscle weakness (generalized): Secondary | ICD-10-CM | POA: Diagnosis not present

## 2016-05-31 DIAGNOSIS — G932 Benign intracranial hypertension: Secondary | ICD-10-CM | POA: Diagnosis not present

## 2016-05-31 DIAGNOSIS — R54 Age-related physical debility: Secondary | ICD-10-CM | POA: Diagnosis not present

## 2016-05-31 DIAGNOSIS — R5381 Other malaise: Secondary | ICD-10-CM | POA: Diagnosis not present

## 2016-05-31 DIAGNOSIS — R05 Cough: Secondary | ICD-10-CM | POA: Diagnosis not present

## 2016-06-07 DIAGNOSIS — L03113 Cellulitis of right upper limb: Secondary | ICD-10-CM | POA: Diagnosis not present

## 2016-06-08 DIAGNOSIS — F331 Major depressive disorder, recurrent, moderate: Secondary | ICD-10-CM | POA: Diagnosis not present

## 2016-06-08 DIAGNOSIS — F17211 Nicotine dependence, cigarettes, in remission: Secondary | ICD-10-CM | POA: Diagnosis not present

## 2016-06-12 DIAGNOSIS — K5900 Constipation, unspecified: Secondary | ICD-10-CM | POA: Diagnosis not present

## 2016-06-12 DIAGNOSIS — S81801D Unspecified open wound, right lower leg, subsequent encounter: Secondary | ICD-10-CM | POA: Diagnosis not present

## 2016-06-12 DIAGNOSIS — S51801A Unspecified open wound of right forearm, initial encounter: Secondary | ICD-10-CM | POA: Diagnosis not present

## 2016-06-13 DIAGNOSIS — R54 Age-related physical debility: Secondary | ICD-10-CM | POA: Diagnosis not present

## 2016-06-13 DIAGNOSIS — R05 Cough: Secondary | ICD-10-CM | POA: Diagnosis not present

## 2016-06-13 DIAGNOSIS — R5381 Other malaise: Secondary | ICD-10-CM | POA: Diagnosis not present

## 2016-06-13 DIAGNOSIS — I10 Essential (primary) hypertension: Secondary | ICD-10-CM | POA: Diagnosis not present

## 2016-06-13 DIAGNOSIS — K219 Gastro-esophageal reflux disease without esophagitis: Secondary | ICD-10-CM | POA: Diagnosis not present

## 2016-06-13 DIAGNOSIS — M6281 Muscle weakness (generalized): Secondary | ICD-10-CM | POA: Diagnosis not present

## 2016-06-13 DIAGNOSIS — J441 Chronic obstructive pulmonary disease with (acute) exacerbation: Secondary | ICD-10-CM | POA: Diagnosis not present

## 2016-06-13 DIAGNOSIS — R600 Localized edema: Secondary | ICD-10-CM | POA: Diagnosis not present

## 2016-06-19 DIAGNOSIS — E876 Hypokalemia: Secondary | ICD-10-CM | POA: Diagnosis not present

## 2016-06-19 DIAGNOSIS — R609 Edema, unspecified: Secondary | ICD-10-CM | POA: Diagnosis not present

## 2016-06-19 DIAGNOSIS — L989 Disorder of the skin and subcutaneous tissue, unspecified: Secondary | ICD-10-CM | POA: Diagnosis not present

## 2016-06-30 DIAGNOSIS — R05 Cough: Secondary | ICD-10-CM | POA: Diagnosis not present

## 2016-06-30 DIAGNOSIS — R0989 Other specified symptoms and signs involving the circulatory and respiratory systems: Secondary | ICD-10-CM | POA: Diagnosis not present

## 2016-06-30 DIAGNOSIS — E871 Hypo-osmolality and hyponatremia: Secondary | ICD-10-CM | POA: Diagnosis not present

## 2016-07-19 DIAGNOSIS — R278 Other lack of coordination: Secondary | ICD-10-CM | POA: Diagnosis not present

## 2016-07-19 DIAGNOSIS — Z741 Need for assistance with personal care: Secondary | ICD-10-CM | POA: Diagnosis not present

## 2016-07-19 DIAGNOSIS — J441 Chronic obstructive pulmonary disease with (acute) exacerbation: Secondary | ICD-10-CM | POA: Diagnosis not present

## 2016-07-19 DIAGNOSIS — M6281 Muscle weakness (generalized): Secondary | ICD-10-CM | POA: Diagnosis not present

## 2016-07-21 DIAGNOSIS — F064 Anxiety disorder due to known physiological condition: Secondary | ICD-10-CM | POA: Diagnosis not present

## 2016-07-21 DIAGNOSIS — F331 Major depressive disorder, recurrent, moderate: Secondary | ICD-10-CM | POA: Diagnosis not present

## 2016-07-26 DIAGNOSIS — R278 Other lack of coordination: Secondary | ICD-10-CM | POA: Diagnosis not present

## 2016-07-26 DIAGNOSIS — J441 Chronic obstructive pulmonary disease with (acute) exacerbation: Secondary | ICD-10-CM | POA: Diagnosis not present

## 2016-07-26 DIAGNOSIS — Z741 Need for assistance with personal care: Secondary | ICD-10-CM | POA: Diagnosis not present

## 2016-07-26 DIAGNOSIS — M6281 Muscle weakness (generalized): Secondary | ICD-10-CM | POA: Diagnosis not present

## 2016-07-27 DIAGNOSIS — Z741 Need for assistance with personal care: Secondary | ICD-10-CM | POA: Diagnosis not present

## 2016-07-27 DIAGNOSIS — M6281 Muscle weakness (generalized): Secondary | ICD-10-CM | POA: Diagnosis not present

## 2016-07-27 DIAGNOSIS — R278 Other lack of coordination: Secondary | ICD-10-CM | POA: Diagnosis not present

## 2016-07-27 DIAGNOSIS — J441 Chronic obstructive pulmonary disease with (acute) exacerbation: Secondary | ICD-10-CM | POA: Diagnosis not present

## 2016-07-28 DIAGNOSIS — F331 Major depressive disorder, recurrent, moderate: Secondary | ICD-10-CM | POA: Diagnosis not present

## 2016-07-28 DIAGNOSIS — F064 Anxiety disorder due to known physiological condition: Secondary | ICD-10-CM | POA: Diagnosis not present

## 2016-07-28 DIAGNOSIS — R278 Other lack of coordination: Secondary | ICD-10-CM | POA: Diagnosis not present

## 2016-07-28 DIAGNOSIS — M6281 Muscle weakness (generalized): Secondary | ICD-10-CM | POA: Diagnosis not present

## 2016-07-28 DIAGNOSIS — Z741 Need for assistance with personal care: Secondary | ICD-10-CM | POA: Diagnosis not present

## 2016-07-28 DIAGNOSIS — J441 Chronic obstructive pulmonary disease with (acute) exacerbation: Secondary | ICD-10-CM | POA: Diagnosis not present

## 2016-07-31 DIAGNOSIS — Z741 Need for assistance with personal care: Secondary | ICD-10-CM | POA: Diagnosis not present

## 2016-07-31 DIAGNOSIS — M6281 Muscle weakness (generalized): Secondary | ICD-10-CM | POA: Diagnosis not present

## 2016-07-31 DIAGNOSIS — J441 Chronic obstructive pulmonary disease with (acute) exacerbation: Secondary | ICD-10-CM | POA: Diagnosis not present

## 2016-07-31 DIAGNOSIS — R278 Other lack of coordination: Secondary | ICD-10-CM | POA: Diagnosis not present

## 2016-08-01 DIAGNOSIS — M6281 Muscle weakness (generalized): Secondary | ICD-10-CM | POA: Diagnosis not present

## 2016-08-01 DIAGNOSIS — J441 Chronic obstructive pulmonary disease with (acute) exacerbation: Secondary | ICD-10-CM | POA: Diagnosis not present

## 2016-08-01 DIAGNOSIS — R278 Other lack of coordination: Secondary | ICD-10-CM | POA: Diagnosis not present

## 2016-08-01 DIAGNOSIS — Z741 Need for assistance with personal care: Secondary | ICD-10-CM | POA: Diagnosis not present

## 2016-08-02 DIAGNOSIS — Z741 Need for assistance with personal care: Secondary | ICD-10-CM | POA: Diagnosis not present

## 2016-08-02 DIAGNOSIS — M6281 Muscle weakness (generalized): Secondary | ICD-10-CM | POA: Diagnosis not present

## 2016-08-02 DIAGNOSIS — J441 Chronic obstructive pulmonary disease with (acute) exacerbation: Secondary | ICD-10-CM | POA: Diagnosis not present

## 2016-08-02 DIAGNOSIS — R278 Other lack of coordination: Secondary | ICD-10-CM | POA: Diagnosis not present

## 2016-08-03 DIAGNOSIS — M6281 Muscle weakness (generalized): Secondary | ICD-10-CM | POA: Diagnosis not present

## 2016-08-03 DIAGNOSIS — J441 Chronic obstructive pulmonary disease with (acute) exacerbation: Secondary | ICD-10-CM | POA: Diagnosis not present

## 2016-08-03 DIAGNOSIS — Z741 Need for assistance with personal care: Secondary | ICD-10-CM | POA: Diagnosis not present

## 2016-08-03 DIAGNOSIS — F331 Major depressive disorder, recurrent, moderate: Secondary | ICD-10-CM | POA: Diagnosis not present

## 2016-08-03 DIAGNOSIS — F17211 Nicotine dependence, cigarettes, in remission: Secondary | ICD-10-CM | POA: Diagnosis not present

## 2016-08-03 DIAGNOSIS — R278 Other lack of coordination: Secondary | ICD-10-CM | POA: Diagnosis not present

## 2016-08-04 DIAGNOSIS — M6281 Muscle weakness (generalized): Secondary | ICD-10-CM | POA: Diagnosis not present

## 2016-08-04 DIAGNOSIS — R278 Other lack of coordination: Secondary | ICD-10-CM | POA: Diagnosis not present

## 2016-08-04 DIAGNOSIS — R05 Cough: Secondary | ICD-10-CM | POA: Diagnosis not present

## 2016-08-04 DIAGNOSIS — Z741 Need for assistance with personal care: Secondary | ICD-10-CM | POA: Diagnosis not present

## 2016-08-04 DIAGNOSIS — J441 Chronic obstructive pulmonary disease with (acute) exacerbation: Secondary | ICD-10-CM | POA: Diagnosis not present

## 2016-08-04 DIAGNOSIS — R0989 Other specified symptoms and signs involving the circulatory and respiratory systems: Secondary | ICD-10-CM | POA: Diagnosis not present

## 2016-08-06 DIAGNOSIS — Z79899 Other long term (current) drug therapy: Secondary | ICD-10-CM | POA: Diagnosis not present

## 2016-08-07 DIAGNOSIS — J441 Chronic obstructive pulmonary disease with (acute) exacerbation: Secondary | ICD-10-CM | POA: Diagnosis not present

## 2016-08-07 DIAGNOSIS — R278 Other lack of coordination: Secondary | ICD-10-CM | POA: Diagnosis not present

## 2016-08-07 DIAGNOSIS — Z741 Need for assistance with personal care: Secondary | ICD-10-CM | POA: Diagnosis not present

## 2016-08-07 DIAGNOSIS — M6281 Muscle weakness (generalized): Secondary | ICD-10-CM | POA: Diagnosis not present

## 2016-08-08 DIAGNOSIS — J441 Chronic obstructive pulmonary disease with (acute) exacerbation: Secondary | ICD-10-CM | POA: Diagnosis not present

## 2016-08-08 DIAGNOSIS — R278 Other lack of coordination: Secondary | ICD-10-CM | POA: Diagnosis not present

## 2016-08-08 DIAGNOSIS — M6281 Muscle weakness (generalized): Secondary | ICD-10-CM | POA: Diagnosis not present

## 2016-08-08 DIAGNOSIS — Z741 Need for assistance with personal care: Secondary | ICD-10-CM | POA: Diagnosis not present

## 2016-08-09 DIAGNOSIS — J441 Chronic obstructive pulmonary disease with (acute) exacerbation: Secondary | ICD-10-CM | POA: Diagnosis not present

## 2016-08-09 DIAGNOSIS — Z741 Need for assistance with personal care: Secondary | ICD-10-CM | POA: Diagnosis not present

## 2016-08-09 DIAGNOSIS — M6281 Muscle weakness (generalized): Secondary | ICD-10-CM | POA: Diagnosis not present

## 2016-08-09 DIAGNOSIS — R278 Other lack of coordination: Secondary | ICD-10-CM | POA: Diagnosis not present

## 2016-08-10 DIAGNOSIS — J441 Chronic obstructive pulmonary disease with (acute) exacerbation: Secondary | ICD-10-CM | POA: Diagnosis not present

## 2016-08-10 DIAGNOSIS — Z741 Need for assistance with personal care: Secondary | ICD-10-CM | POA: Diagnosis not present

## 2016-08-10 DIAGNOSIS — R278 Other lack of coordination: Secondary | ICD-10-CM | POA: Diagnosis not present

## 2016-08-10 DIAGNOSIS — M6281 Muscle weakness (generalized): Secondary | ICD-10-CM | POA: Diagnosis not present

## 2016-08-11 DIAGNOSIS — J441 Chronic obstructive pulmonary disease with (acute) exacerbation: Secondary | ICD-10-CM | POA: Diagnosis not present

## 2016-08-11 DIAGNOSIS — M6281 Muscle weakness (generalized): Secondary | ICD-10-CM | POA: Diagnosis not present

## 2016-08-11 DIAGNOSIS — R278 Other lack of coordination: Secondary | ICD-10-CM | POA: Diagnosis not present

## 2016-08-11 DIAGNOSIS — Z741 Need for assistance with personal care: Secondary | ICD-10-CM | POA: Diagnosis not present

## 2016-08-12 DIAGNOSIS — Z741 Need for assistance with personal care: Secondary | ICD-10-CM | POA: Diagnosis not present

## 2016-08-12 DIAGNOSIS — J441 Chronic obstructive pulmonary disease with (acute) exacerbation: Secondary | ICD-10-CM | POA: Diagnosis not present

## 2016-08-12 DIAGNOSIS — M6281 Muscle weakness (generalized): Secondary | ICD-10-CM | POA: Diagnosis not present

## 2016-08-12 DIAGNOSIS — R278 Other lack of coordination: Secondary | ICD-10-CM | POA: Diagnosis not present

## 2016-08-14 DIAGNOSIS — J441 Chronic obstructive pulmonary disease with (acute) exacerbation: Secondary | ICD-10-CM | POA: Diagnosis not present

## 2016-08-14 DIAGNOSIS — Z741 Need for assistance with personal care: Secondary | ICD-10-CM | POA: Diagnosis not present

## 2016-08-14 DIAGNOSIS — R278 Other lack of coordination: Secondary | ICD-10-CM | POA: Diagnosis not present

## 2016-08-14 DIAGNOSIS — M6281 Muscle weakness (generalized): Secondary | ICD-10-CM | POA: Diagnosis not present

## 2016-08-15 DIAGNOSIS — J441 Chronic obstructive pulmonary disease with (acute) exacerbation: Secondary | ICD-10-CM | POA: Diagnosis not present

## 2016-08-15 DIAGNOSIS — M6281 Muscle weakness (generalized): Secondary | ICD-10-CM | POA: Diagnosis not present

## 2016-08-15 DIAGNOSIS — R278 Other lack of coordination: Secondary | ICD-10-CM | POA: Diagnosis not present

## 2016-08-15 DIAGNOSIS — Z741 Need for assistance with personal care: Secondary | ICD-10-CM | POA: Diagnosis not present

## 2016-08-16 DIAGNOSIS — M6281 Muscle weakness (generalized): Secondary | ICD-10-CM | POA: Diagnosis not present

## 2016-08-16 DIAGNOSIS — J441 Chronic obstructive pulmonary disease with (acute) exacerbation: Secondary | ICD-10-CM | POA: Diagnosis not present

## 2016-08-16 DIAGNOSIS — R278 Other lack of coordination: Secondary | ICD-10-CM | POA: Diagnosis not present

## 2016-08-16 DIAGNOSIS — Z741 Need for assistance with personal care: Secondary | ICD-10-CM | POA: Diagnosis not present

## 2016-08-17 DIAGNOSIS — I10 Essential (primary) hypertension: Secondary | ICD-10-CM | POA: Diagnosis not present

## 2016-08-17 DIAGNOSIS — R739 Hyperglycemia, unspecified: Secondary | ICD-10-CM | POA: Diagnosis not present

## 2016-08-17 DIAGNOSIS — E876 Hypokalemia: Secondary | ICD-10-CM | POA: Diagnosis not present

## 2016-08-17 DIAGNOSIS — M6281 Muscle weakness (generalized): Secondary | ICD-10-CM | POA: Diagnosis not present

## 2016-08-17 DIAGNOSIS — M810 Age-related osteoporosis without current pathological fracture: Secondary | ICD-10-CM | POA: Diagnosis not present

## 2016-08-17 DIAGNOSIS — R05 Cough: Secondary | ICD-10-CM | POA: Diagnosis not present

## 2016-08-17 DIAGNOSIS — Z741 Need for assistance with personal care: Secondary | ICD-10-CM | POA: Diagnosis not present

## 2016-08-17 DIAGNOSIS — J449 Chronic obstructive pulmonary disease, unspecified: Secondary | ICD-10-CM | POA: Diagnosis not present

## 2016-08-17 DIAGNOSIS — R278 Other lack of coordination: Secondary | ICD-10-CM | POA: Diagnosis not present

## 2016-08-17 DIAGNOSIS — R54 Age-related physical debility: Secondary | ICD-10-CM | POA: Diagnosis not present

## 2016-08-17 DIAGNOSIS — S51801D Unspecified open wound of right forearm, subsequent encounter: Secondary | ICD-10-CM | POA: Diagnosis not present

## 2016-08-17 DIAGNOSIS — S81801D Unspecified open wound, right lower leg, subsequent encounter: Secondary | ICD-10-CM | POA: Diagnosis not present

## 2016-08-17 DIAGNOSIS — D649 Anemia, unspecified: Secondary | ICD-10-CM | POA: Diagnosis not present

## 2016-08-17 DIAGNOSIS — J441 Chronic obstructive pulmonary disease with (acute) exacerbation: Secondary | ICD-10-CM | POA: Diagnosis not present

## 2016-08-17 DIAGNOSIS — R600 Localized edema: Secondary | ICD-10-CM | POA: Diagnosis not present

## 2016-08-17 DIAGNOSIS — K219 Gastro-esophageal reflux disease without esophagitis: Secondary | ICD-10-CM | POA: Diagnosis not present

## 2016-08-18 DIAGNOSIS — J441 Chronic obstructive pulmonary disease with (acute) exacerbation: Secondary | ICD-10-CM | POA: Diagnosis not present

## 2016-08-18 DIAGNOSIS — R278 Other lack of coordination: Secondary | ICD-10-CM | POA: Diagnosis not present

## 2016-08-18 DIAGNOSIS — Z741 Need for assistance with personal care: Secondary | ICD-10-CM | POA: Diagnosis not present

## 2016-08-18 DIAGNOSIS — M6281 Muscle weakness (generalized): Secondary | ICD-10-CM | POA: Diagnosis not present

## 2016-08-21 ENCOUNTER — Inpatient Hospital Stay (HOSPITAL_COMMUNITY)
Admission: EM | Admit: 2016-08-21 | Discharge: 2016-08-28 | DRG: 177 | Disposition: A | Discharge status: Home / Self Care | Payer: Medicare Other | Attending: Family Medicine | Admitting: Family Medicine

## 2016-08-21 ENCOUNTER — Encounter (HOSPITAL_COMMUNITY): Payer: Self-pay

## 2016-08-21 ENCOUNTER — Emergency Department (HOSPITAL_COMMUNITY): Payer: Medicare Other

## 2016-08-21 DIAGNOSIS — J90 Pleural effusion, not elsewhere classified: Secondary | ICD-10-CM | POA: Diagnosis not present

## 2016-08-21 DIAGNOSIS — I350 Nonrheumatic aortic (valve) stenosis: Secondary | ICD-10-CM | POA: Diagnosis not present

## 2016-08-21 DIAGNOSIS — D509 Iron deficiency anemia, unspecified: Secondary | ICD-10-CM | POA: Diagnosis present

## 2016-08-21 DIAGNOSIS — J15212 Pneumonia due to Methicillin resistant Staphylococcus aureus: Secondary | ICD-10-CM | POA: Diagnosis not present

## 2016-08-21 DIAGNOSIS — J181 Lobar pneumonia, unspecified organism: Secondary | ICD-10-CM | POA: Diagnosis not present

## 2016-08-21 DIAGNOSIS — R05 Cough: Secondary | ICD-10-CM | POA: Diagnosis not present

## 2016-08-21 DIAGNOSIS — E46 Unspecified protein-calorie malnutrition: Secondary | ICD-10-CM | POA: Diagnosis present

## 2016-08-21 DIAGNOSIS — Z880 Allergy status to penicillin: Secondary | ICD-10-CM

## 2016-08-21 DIAGNOSIS — D72823 Leukemoid reaction: Secondary | ICD-10-CM | POA: Diagnosis not present

## 2016-08-21 DIAGNOSIS — E8809 Other disorders of plasma-protein metabolism, not elsewhere classified: Secondary | ICD-10-CM

## 2016-08-21 DIAGNOSIS — J189 Pneumonia, unspecified organism: Secondary | ICD-10-CM | POA: Diagnosis not present

## 2016-08-21 DIAGNOSIS — Z7952 Long term (current) use of systemic steroids: Secondary | ICD-10-CM | POA: Diagnosis not present

## 2016-08-21 DIAGNOSIS — F339 Major depressive disorder, recurrent, unspecified: Secondary | ICD-10-CM | POA: Diagnosis not present

## 2016-08-21 DIAGNOSIS — Y95 Nosocomial condition: Secondary | ICD-10-CM | POA: Diagnosis present

## 2016-08-21 DIAGNOSIS — Z66 Do not resuscitate: Secondary | ICD-10-CM | POA: Diagnosis present

## 2016-08-21 DIAGNOSIS — J159 Unspecified bacterial pneumonia: Secondary | ICD-10-CM | POA: Diagnosis not present

## 2016-08-21 DIAGNOSIS — J441 Chronic obstructive pulmonary disease with (acute) exacerbation: Secondary | ICD-10-CM | POA: Diagnosis present

## 2016-08-21 DIAGNOSIS — B9562 Methicillin resistant Staphylococcus aureus infection as the cause of diseases classified elsewhere: Secondary | ICD-10-CM | POA: Diagnosis not present

## 2016-08-21 DIAGNOSIS — J690 Pneumonitis due to inhalation of food and vomit: Principal | ICD-10-CM | POA: Diagnosis present

## 2016-08-21 DIAGNOSIS — M6281 Muscle weakness (generalized): Secondary | ICD-10-CM | POA: Diagnosis not present

## 2016-08-21 DIAGNOSIS — J9611 Chronic respiratory failure with hypoxia: Secondary | ICD-10-CM | POA: Diagnosis not present

## 2016-08-21 DIAGNOSIS — E876 Hypokalemia: Secondary | ICD-10-CM | POA: Diagnosis present

## 2016-08-21 DIAGNOSIS — R1319 Other dysphagia: Secondary | ICD-10-CM | POA: Diagnosis not present

## 2016-08-21 DIAGNOSIS — Z7401 Bed confinement status: Secondary | ICD-10-CM | POA: Diagnosis not present

## 2016-08-21 DIAGNOSIS — Z7189 Other specified counseling: Secondary | ICD-10-CM

## 2016-08-21 DIAGNOSIS — J449 Chronic obstructive pulmonary disease, unspecified: Secondary | ICD-10-CM | POA: Diagnosis not present

## 2016-08-21 DIAGNOSIS — Z515 Encounter for palliative care: Secondary | ICD-10-CM | POA: Diagnosis present

## 2016-08-21 DIAGNOSIS — Z9849 Cataract extraction status, unspecified eye: Secondary | ICD-10-CM | POA: Diagnosis not present

## 2016-08-21 DIAGNOSIS — R279 Unspecified lack of coordination: Secondary | ICD-10-CM | POA: Diagnosis not present

## 2016-08-21 DIAGNOSIS — J850 Gangrene and necrosis of lung: Secondary | ICD-10-CM | POA: Diagnosis present

## 2016-08-21 DIAGNOSIS — B965 Pseudomonas (aeruginosa) (mallei) (pseudomallei) as the cause of diseases classified elsewhere: Secondary | ICD-10-CM | POA: Diagnosis present

## 2016-08-21 DIAGNOSIS — D751 Secondary polycythemia: Secondary | ICD-10-CM | POA: Diagnosis present

## 2016-08-21 DIAGNOSIS — Z79899 Other long term (current) drug therapy: Secondary | ICD-10-CM

## 2016-08-21 DIAGNOSIS — Z741 Need for assistance with personal care: Secondary | ICD-10-CM | POA: Diagnosis not present

## 2016-08-21 DIAGNOSIS — D473 Essential (hemorrhagic) thrombocythemia: Secondary | ICD-10-CM | POA: Diagnosis present

## 2016-08-21 DIAGNOSIS — Z7982 Long term (current) use of aspirin: Secondary | ICD-10-CM

## 2016-08-21 DIAGNOSIS — K2100 Gastro-esophageal reflux disease with esophagitis, without bleeding: Secondary | ICD-10-CM | POA: Diagnosis present

## 2016-08-21 DIAGNOSIS — J9622 Acute and chronic respiratory failure with hypercapnia: Secondary | ICD-10-CM | POA: Diagnosis not present

## 2016-08-21 DIAGNOSIS — I10 Essential (primary) hypertension: Secondary | ICD-10-CM | POA: Diagnosis not present

## 2016-08-21 DIAGNOSIS — R278 Other lack of coordination: Secondary | ICD-10-CM | POA: Diagnosis not present

## 2016-08-21 DIAGNOSIS — R000 Tachycardia, unspecified: Secondary | ICD-10-CM | POA: Diagnosis present

## 2016-08-21 DIAGNOSIS — Z9981 Dependence on supplemental oxygen: Secondary | ICD-10-CM | POA: Diagnosis not present

## 2016-08-21 DIAGNOSIS — J9610 Chronic respiratory failure, unspecified whether with hypoxia or hypercapnia: Secondary | ICD-10-CM | POA: Diagnosis not present

## 2016-08-21 DIAGNOSIS — E871 Hypo-osmolality and hyponatremia: Secondary | ICD-10-CM | POA: Diagnosis not present

## 2016-08-21 DIAGNOSIS — Z885 Allergy status to narcotic agent status: Secondary | ICD-10-CM

## 2016-08-21 DIAGNOSIS — Z90710 Acquired absence of both cervix and uterus: Secondary | ICD-10-CM

## 2016-08-21 DIAGNOSIS — J440 Chronic obstructive pulmonary disease with acute lower respiratory infection: Secondary | ICD-10-CM | POA: Diagnosis present

## 2016-08-21 DIAGNOSIS — R600 Localized edema: Secondary | ICD-10-CM | POA: Diagnosis present

## 2016-08-21 DIAGNOSIS — J9621 Acute and chronic respiratory failure with hypoxia: Secondary | ICD-10-CM | POA: Diagnosis not present

## 2016-08-21 DIAGNOSIS — J9620 Acute and chronic respiratory failure, unspecified whether with hypoxia or hypercapnia: Secondary | ICD-10-CM | POA: Diagnosis present

## 2016-08-21 DIAGNOSIS — R2681 Unsteadiness on feet: Secondary | ICD-10-CM | POA: Diagnosis not present

## 2016-08-21 DIAGNOSIS — Z6823 Body mass index (BMI) 23.0-23.9, adult: Secondary | ICD-10-CM

## 2016-08-21 DIAGNOSIS — Z87891 Personal history of nicotine dependence: Secondary | ICD-10-CM | POA: Diagnosis not present

## 2016-08-21 DIAGNOSIS — I493 Ventricular premature depolarization: Secondary | ICD-10-CM | POA: Diagnosis present

## 2016-08-21 DIAGNOSIS — D72829 Elevated white blood cell count, unspecified: Secondary | ICD-10-CM | POA: Diagnosis present

## 2016-08-21 DIAGNOSIS — R609 Edema, unspecified: Secondary | ICD-10-CM

## 2016-08-21 DIAGNOSIS — R069 Unspecified abnormalities of breathing: Secondary | ICD-10-CM | POA: Diagnosis not present

## 2016-08-21 DIAGNOSIS — Z7951 Long term (current) use of inhaled steroids: Secondary | ICD-10-CM

## 2016-08-21 DIAGNOSIS — D75839 Thrombocytosis, unspecified: Secondary | ICD-10-CM | POA: Diagnosis present

## 2016-08-21 DIAGNOSIS — Z881 Allergy status to other antibiotic agents status: Secondary | ICD-10-CM

## 2016-08-21 DIAGNOSIS — D500 Iron deficiency anemia secondary to blood loss (chronic): Secondary | ICD-10-CM | POA: Diagnosis not present

## 2016-08-21 DIAGNOSIS — K219 Gastro-esophageal reflux disease without esophagitis: Secondary | ICD-10-CM | POA: Diagnosis not present

## 2016-08-21 DIAGNOSIS — R7989 Other specified abnormal findings of blood chemistry: Secondary | ICD-10-CM | POA: Diagnosis not present

## 2016-08-21 DIAGNOSIS — I313 Pericardial effusion (noninflammatory): Secondary | ICD-10-CM | POA: Diagnosis present

## 2016-08-21 DIAGNOSIS — R739 Hyperglycemia, unspecified: Secondary | ICD-10-CM | POA: Diagnosis not present

## 2016-08-21 DIAGNOSIS — E43 Unspecified severe protein-calorie malnutrition: Secondary | ICD-10-CM | POA: Diagnosis not present

## 2016-08-21 LAB — BASIC METABOLIC PANEL
Anion gap: 11 (ref 5–15)
BUN: 15 mg/dL (ref 6–20)
CHLORIDE: 84 mmol/L — AB (ref 101–111)
CO2: 39 mmol/L — ABNORMAL HIGH (ref 22–32)
Calcium: 8.7 mg/dL — ABNORMAL LOW (ref 8.9–10.3)
Creatinine, Ser: 0.42 mg/dL — ABNORMAL LOW (ref 0.44–1.00)
GFR calc Af Amer: >60 mL/min (ref >60)
GLUCOSE: 113 mg/dL — AB (ref 65–99)
Potassium: 2.7 mmol/L — CL (ref 3.5–5.1)
Sodium: 134 mmol/L — ABNORMAL LOW (ref 135–145)

## 2016-08-21 LAB — DIFFERENTIAL
Basophils Absolute: 0 10*3/uL (ref 0.0–0.1)
Basophils Relative: 0 %
EOS PCT: 0 %
Eosinophils Absolute: 0.1 10*3/uL (ref 0.0–0.7)
LYMPHS PCT: 2 %
Lymphs Abs: 0.8 10*3/uL (ref 0.7–4.0)
Monocytes Absolute: 3.4 10*3/uL — ABNORMAL HIGH (ref 0.1–1.0)
Monocytes Relative: 8 %
NEUTROS ABS: 38.7 10*3/uL — AB (ref 1.7–7.7)
Neutrophils Relative %: 90 %

## 2016-08-21 LAB — CBC
HCT: 31.7 % — ABNORMAL LOW (ref 36.0–46.0)
Hemoglobin: 10 g/dL — ABNORMAL LOW (ref 12.0–15.0)
MCH: 28.5 pg (ref 26.0–34.0)
MCHC: 31.5 g/dL (ref 30.0–36.0)
MCV: 90.3 fL (ref 78.0–100.0)
PLATELETS: 684 10*3/uL — AB (ref 150–400)
RBC: 3.51 MIL/uL — ABNORMAL LOW (ref 3.87–5.11)
RDW: 17.3 % — AB (ref 11.5–15.5)
WBC: 43.6 10*3/uL — ABNORMAL HIGH (ref 4.0–10.5)

## 2016-08-21 LAB — SAVE SMEAR

## 2016-08-21 LAB — D-DIMER, QUANTITATIVE: D-Dimer, Quant: 0.83 ug/mL-FEU — ABNORMAL HIGH (ref 0.00–0.50)

## 2016-08-21 LAB — CBG MONITORING, ED: GLUCOSE-CAPILLARY: 121 mg/dL — AB (ref 65–99)

## 2016-08-21 LAB — I-STAT TROPONIN, ED: TROPONIN I, POC: 0 ng/mL (ref 0.00–0.08)

## 2016-08-21 LAB — I-STAT CG4 LACTIC ACID, ED: Lactic Acid, Venous: 1.63 mmol/L (ref 0.5–1.9)

## 2016-08-21 MED ORDER — FLUTICASONE FUROATE-VILANTEROL 200-25 MCG/INH IN AEPB
1.0000 | INHALATION_SPRAY | Freq: Every day | RESPIRATORY_TRACT | Status: DC
Start: 2016-08-22 — End: 2016-08-28
  Administered 2016-08-22 – 2016-08-28 (×7): 1 via RESPIRATORY_TRACT
  Filled 2016-08-21: qty 28

## 2016-08-21 MED ORDER — AZTREONAM 2 G IJ SOLR
INTRAMUSCULAR | Status: AC
  Filled 2016-08-21: qty 2

## 2016-08-21 MED ORDER — DEXTROSE 5 % IV SOLN
2.0000 g | Freq: Once | INTRAVENOUS | Status: DC
  Filled 2016-08-21: qty 2

## 2016-08-21 MED ORDER — POTASSIUM CHLORIDE CRYS ER 20 MEQ PO TBCR
40.0000 meq | EXTENDED_RELEASE_TABLET | Freq: Once | ORAL | Status: AC
  Administered 2016-08-21: 40 meq via ORAL
  Filled 2016-08-21: qty 2

## 2016-08-21 MED ORDER — SODIUM CHLORIDE 0.9 % IV BOLUS (SEPSIS)
2000.0000 mL | Freq: Once | INTRAVENOUS | Status: AC
  Administered 2016-08-21: 2000 mL via INTRAVENOUS

## 2016-08-21 MED ORDER — SODIUM CHLORIDE 0.9 % IV BOLUS (SEPSIS): 1000.0000 mL | Freq: Once | INTRAVENOUS | Status: DC

## 2016-08-21 MED ORDER — MIRTAZAPINE 15 MG PO TABS
15.0000 mg | ORAL_TABLET | Freq: Every day | ORAL | Status: DC
  Administered 2016-08-21 – 2016-08-27 (×7): 15 mg via ORAL
  Filled 2016-08-21 (×7): qty 1

## 2016-08-21 MED ORDER — PANTOPRAZOLE SODIUM 40 MG PO TBEC
40.0000 mg | DELAYED_RELEASE_TABLET | Freq: Every day | ORAL | Status: DC
  Administered 2016-08-22: 40 mg via ORAL
  Filled 2016-08-21: qty 1

## 2016-08-21 MED ORDER — PREDNISONE 10 MG PO TABS: 10.0000 mg | ORAL_TABLET | Freq: Every day | ORAL | Status: DC

## 2016-08-21 MED ORDER — ENOXAPARIN SODIUM 40 MG/0.4ML ~~LOC~~ SOLN
40.0000 mg | SUBCUTANEOUS | Status: DC
  Administered 2016-08-22 – 2016-08-27 (×6): 40 mg via SUBCUTANEOUS
  Filled 2016-08-21 (×6): qty 0.4

## 2016-08-21 MED ORDER — DEXTROSE 5 % IV SOLN
2.0000 g | Freq: Three times a day (TID) | INTRAVENOUS | Status: DC
  Administered 2016-08-21 – 2016-08-22 (×4): 2 g via INTRAVENOUS
  Filled 2016-08-21 (×5): qty 2

## 2016-08-21 MED ORDER — MAGNESIUM SULFATE 50 % IJ SOLN: 2.0000 g | Freq: Once | INTRAMUSCULAR | Status: DC

## 2016-08-21 MED ORDER — POTASSIUM CHLORIDE IN NACL 20-0.9 MEQ/L-% IV SOLN
INTRAVENOUS | Status: AC
  Administered 2016-08-21: 23:00:00 via INTRAVENOUS

## 2016-08-21 MED ORDER — MAGNESIUM SULFATE 2 GM/50ML IV SOLN
2.0000 g | Freq: Once | INTRAVENOUS | Status: AC
  Administered 2016-08-21: 2 g via INTRAVENOUS
  Filled 2016-08-21: qty 50

## 2016-08-21 MED ORDER — VANCOMYCIN HCL 500 MG IV SOLR
INTRAVENOUS | Status: AC
  Filled 2016-08-21: qty 500

## 2016-08-21 MED ORDER — IPRATROPIUM-ALBUTEROL 0.5-2.5 (3) MG/3ML IN SOLN: 3.0000 mL | RESPIRATORY_TRACT | Status: DC | PRN

## 2016-08-21 MED ORDER — GUAIFENESIN ER 600 MG PO TB12
1200.0000 mg | ORAL_TABLET | Freq: Two times a day (BID) | ORAL | Status: DC
  Administered 2016-08-21 – 2016-08-22 (×3): 1200 mg via ORAL
  Filled 2016-08-21 (×7): qty 2

## 2016-08-21 MED ORDER — SODIUM CHLORIDE 0.9 % IV SOLN
500.0000 mg | Freq: Two times a day (BID) | INTRAVENOUS | Status: DC
  Administered 2016-08-22 – 2016-08-23 (×4): 500 mg via INTRAVENOUS
  Filled 2016-08-21 (×5): qty 500

## 2016-08-21 MED ORDER — ASPIRIN 81 MG PO CHEW: 324.0000 mg | CHEWABLE_TABLET | Freq: Once | ORAL | Status: DC

## 2016-08-21 MED ORDER — VANCOMYCIN HCL IN DEXTROSE 1-5 GM/200ML-% IV SOLN
1000.0000 mg | Freq: Once | INTRAVENOUS | Status: AC
  Administered 2016-08-21: 1000 mg via INTRAVENOUS
  Filled 2016-08-21: qty 200

## 2016-08-21 MED ORDER — VANCOMYCIN HCL 500 MG IV SOLR
500.0000 mg | Freq: Two times a day (BID) | INTRAVENOUS | Status: DC
  Filled 2016-08-21 (×2): qty 500

## 2016-08-21 MED ORDER — DIPHENHYDRAMINE HCL 25 MG PO CAPS
25.0000 mg | ORAL_CAPSULE | Freq: Four times a day (QID) | ORAL | Status: DC | PRN
  Administered 2016-08-25 – 2016-08-26 (×2): 25 mg via ORAL
  Filled 2016-08-21 (×2): qty 1

## 2016-08-21 NOTE — ED Notes (Signed)
Hospitalist at bedside. Informed of difficulty IV stick. States he will put an order for PICC line

## 2016-08-21 NOTE — ED Notes (Addendum)
CRITICAL VALUE ALERT  Critical Value:  K+ 2.7  Date & Time Notied:  1701  Provider Notified: Yes  Orders Received/Actions taken: Notified Dr. Tomi Bamberger

## 2016-08-21 NOTE — ED Triage Notes (Signed)
Pt brought in by [facility]. Called to [facility] due to cough and tachycardia. HR 120's when EMS arrived. Pt has been coughing for 1 week. Pt reports SOB. Pt is non ambulatory. Has dx of COPD. Sats 96 % on room air due to O2 tubing not being attached to concentrator

## 2016-08-21 NOTE — ED Notes (Addendum)
Pt with difficult IV access. 22 in right forearm infiltrated when fluids started. Restarted in left forearm #22

## 2016-08-21 NOTE — Progress Notes (Signed)
Pharmacy Antibiotic Note  Leah Dalton is a 71 y.o. female admitted on 08/21/2016 with pneumonia.  Pharmacy has been consulted for vancomycin and aztreonam dosing.  Plan: Aztreonam 2 gm IV q8 hours Vancomycin 1 gm IV x 1 then 500 mg IV q12 hours F/u renal function, cultures and clinical course   Weight: 134 lb (60.8 kg)  Temp (24hrs), Avg:98.7 F (37.1 C), Min:98.7 F (37.1 C), Max:98.7 F (37.1 C)   Recent Labs Lab 08/21/16 1540  WBC 43.6*  CREATININE 0.42*    Estimated Creatinine Clearance: 55.7 mL/min (A) (by C-G formula based on SCr of 0.42 mg/dL (L)).    Allergies  Allergen Reactions  . Codeine Itching  . Daliresp [Roflumilast] Diarrhea  . Keflex [Cephalexin] Other (See Comments)    Gives pt yeast infection  . Penicillins Itching    Has patient had a PCN reaction causing immediate rash, facial/tongue/throat swelling, SOB or lightheadedness with hypotension: No Has patient had a PCN reaction causing severe rash involving mucus membranes or skin necrosis: Yes Has patient had a PCN reaction that required hospitalization No Has patient had a PCN reaction occurring within the last 10 years: No If all of the above answers are "NO", then may proceed with Cephalosporin use.       Thank you for allowing pharmacy to be a part of this patient's care.  Excell Seltzer Poteet 08/21/2016 5:10 PM

## 2016-08-21 NOTE — H&P (Signed)
History and Physical    MARSHAYLA MITSCHKE WUJ:[id number] DOB: 1945-07-01 DOA: 08/21/2016  PCP: Crisoforo Oxford, MD   Patient [facility]  Chief Complaint: Cough, SOB, right-sided pleuritic pain   HPI: Leah Dalton is a 71 y.o. female with medical history significant for COPD with chronic hypercarbic respiratory failure, GERD with esophagitis, hereditary hemachromatosis, history of polycythemia requiring therapeutic phlebotomy remotely, now presenting from her nursing facility for evaluation of progressive cough and dyspnea with right-sided pleuritic pain. Patient reports that she had been in her usual state of health until approximately one week ago when she noted the development of a cough with production of thick white, clear, and yellow sputum. Since that time, the cough has persisted and she has grown progressively more dyspneic. She denies any subjective fevers or chills, but notes chest pain involving the right lateral chest wall, worse with cough or deep inspiration. Initially, the symptoms were rather mild and she felt that they would resolve on their own with time, but with continued worsening, she was eventually convinced to come into the ED for evaluation. She reports some chronic lower extremity swelling, but not significantly worsened.  ED Course: Upon arrival to the ED, patient is found to be afebrile, saturating adequately on her usual supplemental oxygen, tachypneic in the high 20s, tachycardic to 127, and with stable blood pressure. EKG features sinus tachycardia with rate 120 and PVC. Chest x-ray is notable for right middle lobe pneumonia. Chemistry panel reveals a potassium of 2.7 and bicarbonate of 39. CBC was notable for a leukocytosis to 43,600, and thrombocytosis to 684,000, and a stable normocytic anemia with hemoglobin of 10.0. Troponin is undetectable and lactic acid is reassuring at 1.63. Patient was treated with 40 mEq oral potassium, 2 L of normal saline, and  empiric vancomycin and aztreonam. Tachycardia improved with the IV fluids, blood pressure remained stable, and patient remains dyspneic, but not in acute distress. She'll be admitted to the telemetry unit for ongoing evaluation and management of cough, dyspnea, and right sided pleuritic pain, secondary to right middle lobe pneumonia.  Review of Systems:  All other systems reviewed and apart from HPI, are negative.  Past Medical History:  Diagnosis Date  . AVM (arteriovenous malformation) of colon   . COPD (chronic obstructive pulmonary disease) ([place])    O2 dependent, started 05/31/2012  . Diverticula of colon   . Esophagitis   . GERD (gastroesophageal reflux disease)   . 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 History of tobacco abuse   . Osteoporosis   . Polycythemia    Has had to have phlebotomies in the pas    Past Surgical History:  Procedure Laterality Date  . ABDOMINAL HYSTERECTOMY    . CATARACT EXTRACTION    . 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 Engorged internal hemorrhoids; colonic diverticulosis.  Marland Kitchen ESOPHAGOGASTRODUODENOSCOPY N/A 02/23/2014   RMR:  Ulcerative/erosive esophagitis. small hiatal hernia     reports that she quit smoking about 6 years ago. She has never used smokeless tobacco. She reports that she does not drink alcohol or use drugs.  Allergies  Allergen Reactions  . Codeine Itching  . Daliresp [Roflumilast] Diarrhea  . Keflex [Cephalexin] Other (See Comments)    Gives pt yeast infection  . Penicillins Itching    Has patient had a PCN reaction causing immediate rash, facial/tongue/throat swelling, SOB or lightheadedness with hypotension: No Has patient had  a PCN reaction causing severe rash involving mucus membranes or skin necrosis: Yes Has patient had a PCN reaction that required hospitalization No Has patient had a PCN reaction occurring within the last 10 years: No If  all of the above answers are "NO", then may proceed with Cephalosporin use.     History reviewed. No pertinent family history.   Prior to Admission medications   Medication Sig Start Date End Date Taking? Authorizing Provider  albuterol (PROVENTIL HFA;VENTOLIN HFA) 108 (90 BASE) MCG/ACT inhaler Inhale 2 puffs into the lungs every 6 (six) hours as needed for wheezing.    [provider]  albuterol (PROVENTIL) (2.5 MG/3ML) 0.083% nebulizer solution Take 2.5 mg by nebulization 3 (three) times daily.    [provider]  aspirin EC 81 MG tablet Take 81 mg by mouth daily.     [provider]  calcium carbonate (OS-CAL) 1250 (500 Ca) MG chewable tablet Chew 1 tablet by mouth daily.    [provider]  Cholecalciferol (VITAMIN D-3) 5000 UNITS TABS Take 1 tablet by mouth daily.    [provider]  diphenhydrAMINE (BENADRYL) 25 mg capsule Take 1 capsule (25 mg total) by mouth every 6 (six) hours as needed for itching (rash). 03/15/16   Sinda Du, MD  fluocinonide cream (LIDEX) 0.05 % Apply topically 2 (two) times daily. 03/15/16   Sinda Du, MD  fluticasone furoate-vilanterol (BREO ELLIPTA) 200-25 MCG/INH AEPB Inhale 1 puff into the lungs daily.    [provider]  furosemide (LASIX) 40 MG tablet Take 40 mg by mouth daily as needed for fluid.     [provider]  guaiFENesin (MUCINEX) 600 MG 12 hr tablet Take 2 tablets (1,200 mg total) by mouth 2 (two) times daily. 02/26/16   Sinda Du, MD  ipratropium (ATROVENT) 0.02 % nebulizer solution Take 500 mcg by nebulization 3 (three) times daily.    [provider]  levofloxacin (LEVAQUIN) 500 MG tablet Take 1 tablet (500 mg total) by mouth daily. 02/27/16   Sinda Du, MD  pantoprazole (PROTONIX) 40 MG tablet Take 1 tablet (40 mg total) by mouth 2 (two) times daily. Patient taking differently: Take 40 mg by mouth daily.  02/24/14   Sinda Du, MD  potassium  chloride (KLOR-CON) 20 MEQ packet Take 20 mEq by mouth daily. 03/15/16   Sinda Du, MD  sertraline (ZOLOFT) 50 MG tablet Take 1 tablet (50 mg total) by mouth daily. 03/16/16   Sinda Du, MD    Physical Exam: Vitals:   08/21/16 1600 08/21/16 1615 08/21/16 1815 08/21/16 1815  BP: (!) 127/115   104/73  Pulse: (!) 118 (!) 119 (!) 115   Resp: 14 17 17    Temp:      TempSrc:      SpO2: 96% 96% 99%   Weight:          Constitutional: Tachypneic and coughing. No pallor or cyanosis.  Eyes: PERTLA, lids and conjunctivae normal ENMT: Mucous membranes are dry. Posterior pharynx clear of any exudate or lesions.   Neck: normal, supple, no masses, no thyromegaly Respiratory: Coarse rales at right base, no wheezing, no crackles. Increased WOB, productive coughing observed.  Cardiovascular: Rate ~120 and regular. 1+ edema to bilateral LE's distally. No significant JVD. Abdomen: No distension, no tenderness, no masses palpated. Bowel sounds normal.  Musculoskeletal: no clubbing / cyanosis. No joint deformity upper and lower extremities.   Skin: no significant rashes, lesions, ulcers. Warm, dry, well-perfused.  Neurologic: CN 2-12 grossly  intact. Sensation intact, DTR normal. Strength 5/5 in all 4 limbs.  Psychiatric: Alert and oriented x 3. Pleasant and cooperative.     Labs on Admission: I have personally reviewed following labs and imaging studies  CBC:  Recent Labs Lab 08/21/16 1540  WBC 43.6*  HGB 10.0*  HCT 31.7*  MCV 90.3  PLT 409*   Basic Metabolic Panel:  Recent Labs Lab 08/21/16 1540  NA 134*  K 2.7*  CL 84*  CO2 39*  GLUCOSE 113*  BUN 15  CREATININE 0.42*  CALCIUM 8.7*   GFR: Estimated Creatinine Clearance: 55.7 mL/min (A) (by C-G formula based on SCr of 0.42 mg/dL (L)). Liver Function Tests: No results for input(s): AST, ALT, ALKPHOS, BILITOT, PROT, ALBUMIN in the last 168 hours. No results for input(s): LIPASE, AMYLASE in the last 168 hours. No  results for input(s): AMMONIA in the last 168 hours. Coagulation Profile: No results for input(s): INR, PROTIME in the last 168 hours. Cardiac Enzymes: No results for input(s): CKTOTAL, CKMB, CKMBINDEX, TROPONINI in the last 168 hours. BNP (last 3 results) No results for input(s): PROBNP in the last 8760 hours. HbA1C: No results for input(s): HGBA1C in the last 72 hours. CBG:  Recent Labs Lab 08/21/16 1601  GLUCAP 121*   Lipid Profile: No results for input(s): CHOL, HDL, LDLCALC, TRIG, CHOLHDL, LDLDIRECT in the last 72 hours. Thyroid Function Tests: No results for input(s): TSH, T4TOTAL, FREET4, T3FREE, THYROIDAB in the last 72 hours. Anemia Panel: No results for input(s): VITAMINB12, FOLATE, FERRITIN, TIBC, IRON, RETICCTPCT in the last 72 hours. Urine analysis: No results found for: COLORURINE, APPEARANCEUR, LABSPEC, [place], GLUCOSEU, [place], [place], [place], [place], UROBILINOGEN, NITRITE, LEUKOCYTESUR Sepsis Labs: @LABRCNTIP (procalcitonin:4,lacticidven:4) ) Recent Results (from the past 240 hour(s))  Blood culture (routine x 2)     Status: None (Preliminary result)   Collection Time: 08/21/16  5:19 PM  Result Value Ref Range Status   Specimen Description BLOOD LEFT ARM  Final   Special Requests   Final    BOTTLES DRAWN AEROBIC ONLY Blood Culture results may not be optimal due to an inadequate volume of blood received in culture bottles   Culture PENDING  Incomplete   Report Status PENDING  Incomplete  Blood culture (routine x 2)     Status: None (Preliminary result)   Collection Time: 08/21/16  5:19 PM  Result Value Ref Range Status   Specimen Description LEFT ANTECUBITAL  Final   Special Requests   Final    BOTTLES DRAWN AEROBIC ONLY Blood Culture results may not be optimal due to an inadequate volume of blood received in culture bottles   Culture PENDING  Incomplete   Report Status PENDING  Incomplete     Radiological Exams on Admission: Dg Chest 2  View  Result Date: 08/21/2016 CLINICAL DATA:  Cough. EXAM: CHEST  2 VIEW COMPARISON:  April 20, 2016 FINDINGS: Right middle lobe infiltrate, new in the interval. No other interval changes or acute abnormalities. IMPRESSION: 1. Right middle lobe pneumonia.  Recommend follow-up to resolution. 2. Mild anterior wedging of a lower thoracic vertebral body in similar in the interval given difference in positioning. Electronically Signed   By: Dorise Bullion III M.D   On: 08/21/2016 16:56    EKG: Independently reviewed. Sinus tachycardia (rate 120), PVC.   Assessment/Plan  1. HCAP  - Pt presents from nursing facility with 1 wk of progressive dyspnea, productive cough, and right pleuritic pain  - Found to have marked leukocytosis and RML consolidation  on CXR  - Lactic acid reassuring at 1.63, no fever, saturating and mentating well with 3Lpm of supplemental O2  - She was treated with vancomycin and Azactam in ED  - Plan to obtain sputum culture and gram stain, check for strep pneumo antigen, continue current abx, continue supportive care with supplemental O2, nebs, mucolytic   2. COPD with chronic hypercarbic respiratory failure, steroid-dependent - No wheezing or obstructive breathing on admission   - Continue scheduled Breo, daily prednisone, prn DuoNebs, supplemental O2   3. Hypokalemia  - Serum potassium is 2.7 on admission  - She was treated with 40 mEq oral potassium, 2 g IV magnesium, and KCl has been added to IVF - Monitor on telemetry and repeat chem panel in am    4. Leukocytosis, thrombocytosis - WBC is 43,600 on admission without hx of leukemia, likely secondary to infection and/or her chronic prednisone therapy, smear eval requested  - Platelets 684k on admission, likely reactive in setting of infection, smear eval requested   5. GERD - EGD in 2015 with erosive esophagitis  - Managed at home with daily PPI, will continue   6. Normocytic anemia - Hgb is stable at 10.0 on  admission with no apparent bleeding  - Attributed to IDA; monitor with periodic CBC  7. D-dimer elevation  - D-dimer mildly elevated to 0.83 - Non-specific, possibly secondary to PNA - There is edema in legs without tenderness, likely from chronic steroid - Check BLE venous US    DVT prophylaxis: sq Lovenox Code Status: Full  Family Communication: Discussed with patient Disposition Plan: Admit to telemetry Consults called: None Admission status: Inpatient    Vianne Bulls, MD [facility] Pager [phone]  If 7PM-7AM, please contact night-coverage [email or web address] Password [facility]  08/21/2016, 6:27 PM

## 2016-08-21 NOTE — ED Provider Notes (Addendum)
[facility] DEPT Provider Note   CSN: [id number] Arrival date & time: 08/21/16  1507     History   Chief Complaint Chief Complaint  Patient presents with  . Cough  . Tachycardia    HPI Leah Dalton is a 71 y.o. female.  HPI Patient presents to the emergency room for evaluation of right-sided chest pain. Patient states she's had a cough for the last few days. He started developing pain on the right side of her ribs several days ago. Patient didn't think it was too severe so she was waiting to see if the symptoms would resolve. She is a resident of the [facility]. They noted the patient was coughing a lot. They suggested she come to the emergency room for evaluation.  Patient denies any fevers. Pain in her chest increases with deep breathing and coughing. She is having some mild swelling of her legs. She also has mild pain in her ankles and feet. No history of PE or DVT. Patient has a history of COPD but quit smoking cigarettes proximal 0.5 years ago. She denies any nausea vomiting or diarrhea. No dysuria. Past Medical History:  Diagnosis Date  . AVM (arteriovenous malformation) of colon   . COPD (chronic obstructive pulmonary disease) ([place])    O2 dependent, started 05/31/2012  . Diverticula of colon   . Esophagitis   . GERD (gastroesophageal reflux disease)   . 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 History of tobacco abuse   . Osteoporosis   . Polycythemia    Has had to have phlebotomies in the pas    Patient Active Problem List   Diagnosis Date Noted  . COPD exacerbation ([place]) 02/20/2016  . Acute hypokalemia 02/20/2016  . 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 AVM (arteriovenous malformation) of colon 04/10/2014  . IDA (iron deficiency anemia) 04/10/2014  . External hemorrhoids 02/24/2014  . Reflux esophagitis   . Acute blood loss anemia 02/22/2014  . Symptomatic anemia 02/22/2014  . COPD with acute exacerbation ([place]) 06/07/2012  . Acute-on-chronic  respiratory failure ([place]) 06/07/2012  . Polycythemia 06/07/2012  . Hyponatremia 06/07/2012    Past Surgical History:  Procedure Laterality Date  . ABDOMINAL HYSTERECTOMY    . CATARACT EXTRACTION    . 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 Engorged internal hemorrhoids; colonic diverticulosis.  Marland Kitchen ESOPHAGOGASTRODUODENOSCOPY N/A 02/23/2014   RMR:  Ulcerative/erosive esophagitis. small hiatal hernia    OB History    Gravida Para Term Preterm AB Living             1   SAB TAB Ectopic Multiple Live Births                   Home Medications    Prior to Admission medications   Medication Sig Start Date End Date Taking? Authorizing Provider  albuterol (PROVENTIL HFA;VENTOLIN HFA) 108 (90 BASE) MCG/ACT inhaler Inhale 2 puffs into the lungs every 6 (six) hours as needed for wheezing.    [provider]  albuterol (PROVENTIL) (2.5 MG/3ML) 0.083% nebulizer solution Take 2.5 mg by nebulization 3 (three) times daily.    [provider]  aspirin EC 81 MG tablet Take 81 mg by mouth daily.     [provider]  calcium carbonate (OS-CAL) 1250 (500 Ca) MG chewable tablet Chew 1 tablet by mouth daily.    [provider]  Cholecalciferol (VITAMIN D-3) 5000 UNITS TABS Take 1 tablet  by mouth daily.    [provider]  diphenhydrAMINE (BENADRYL) 25 mg capsule Take 1 capsule (25 mg total) by mouth every 6 (six) hours as needed for itching (rash). 03/15/16   Sinda Du, MD  fluocinonide cream (LIDEX) 0.05 % Apply topically 2 (two) times daily. 03/15/16   Sinda Du, MD  fluticasone furoate-vilanterol (BREO ELLIPTA) 200-25 MCG/INH AEPB Inhale 1 puff into the lungs daily.    [provider]  furosemide (LASIX) 40 MG tablet Take 40 mg by mouth daily as needed for fluid.     [provider]  guaiFENesin (MUCINEX) 600 MG 12 hr tablet Take 2 tablets (1,200 mg total) by mouth 2  (two) times daily. 02/26/16   Sinda Du, MD  ipratropium (ATROVENT) 0.02 % nebulizer solution Take 500 mcg by nebulization 3 (three) times daily.    [provider]  levofloxacin (LEVAQUIN) 500 MG tablet Take 1 tablet (500 mg total) by mouth daily. 02/27/16   Sinda Du, MD  pantoprazole (PROTONIX) 40 MG tablet Take 1 tablet (40 mg total) by mouth 2 (two) times daily. Patient taking differently: Take 40 mg by mouth daily.  02/24/14   Sinda Du, MD  potassium chloride (KLOR-CON) 20 MEQ packet Take 20 mEq by mouth daily. 03/15/16   Sinda Du, MD  predniSONE (STERAPRED UNI-PAK 21 TAB) 10 MG (21) TBPK tablet Take by package instructions 03/15/16   Sinda Du, MD  sertraline (ZOLOFT) 50 MG tablet Take 1 tablet (50 mg total) by mouth daily. 03/16/16   Sinda Du, MD    Family History No family history on file.  Social History Social History  Substance Use Topics  . Smoking status: Former Smoker    Quit date: 2012  . Smokeless tobacco: Never Used  . Alcohol use No     Allergies   Codeine; Daliresp [roflumilast]; Keflex [cephalexin]; and Penicillins   Review of Systems Review of Systems  All other systems reviewed and are negative.    Physical Exam Updated Vital Signs BP 126/71   Pulse (!) 119   Temp 98.7 F (37.1 C) (Oral)   Resp 17   Wt 60.8 kg (134 lb)   SpO2 96%   BMI 23.00 kg/m   Physical Exam  Constitutional: No distress.  Frail, elderly  HENT:  Head: Normocephalic and atraumatic.  Right Ear: External ear normal.  Left Ear: External ear normal.  Eyes: Conjunctivae are normal. Right eye exhibits no discharge. Left eye exhibits no discharge. No scleral icterus.  Neck: Neck supple. No tracheal deviation present.  Cardiovascular: Regular rhythm and intact distal pulses.  Tachycardia present.   Pulmonary/Chest: Effort normal. No stridor. No respiratory distress. She has decreased breath sounds (right greater than left). She has  wheezes. She has rhonchi. She has no rales.  Abdominal: Soft. Bowel sounds are normal. She exhibits no distension. There is no tenderness. There is no rebound and no guarding.  Musculoskeletal: She exhibits no edema or tenderness.  Neurological: She is alert. She has normal strength. No cranial nerve deficit (no facial droop, extraocular movements intact, no slurred speech) or sensory deficit. She exhibits normal muscle tone. She displays no seizure activity. Coordination normal.  Skin: Skin is warm and dry. No rash noted.  Psychiatric: She has a normal mood and affect.  Nursing note and vitals reviewed.    ED Treatments / Results  Labs (all labs ordered are listed, but only abnormal results are displayed) Labs Reviewed  BASIC METABOLIC PANEL - Abnormal; Notable for the  following:       Result Value   Sodium 134 (*)    Potassium 2.7 (*)    Chloride 84 (*)    CO2 39 (*)    Glucose, Bld 113 (*)    Creatinine, Ser 0.42 (*)    Calcium 8.7 (*)    All other components within normal limits  CBC - Abnormal; Notable for the following:    WBC 43.6 (*)    RBC 3.51 (*)    Hemoglobin 10.0 (*)    HCT 31.7 (*)    RDW 17.3 (*)    Platelets 684 (*)    All other components within normal limits  D-DIMER, QUANTITATIVE (NOT AT [facility]) - Abnormal; Notable for the following:    D-Dimer, Quant 0.83 (*)    All other components within normal limits  CBG MONITORING, ED - Abnormal; Notable for the following:    Glucose-Capillary 121 (*)    All other components within normal limits  CULTURE, BLOOD (ROUTINE X 2)  CULTURE, BLOOD (ROUTINE X 2)  I-STAT TROPOININ, ED  I-STAT CG4 LACTIC ACID, ED    EKG  EKG Interpretation  Date/Time:  Monday Aug 21 2016 16:12:18 EDT Ventricular Rate:  120 PR Interval:    QRS Duration: 86 QT Interval:  304 QTC Calculation: 430 R Axis:   -9 Text Interpretation:  Sinus tachycardia Ventricular premature complex Consider right atrial enlargement Inferior infarct, age  indeterminate Confirmed by Dorie Rank ([id number]) on 08/21/2016 4:35:43 PM       Radiology Dg Chest 2 View  Result Date: 08/21/2016 CLINICAL DATA:  Cough. EXAM: CHEST  2 VIEW COMPARISON:  April 20, 2016 FINDINGS: Right middle lobe infiltrate, new in the interval. No other interval changes or acute abnormalities. IMPRESSION: 1. Right middle lobe pneumonia.  Recommend follow-up to resolution. 2. Mild anterior wedging of a lower thoracic vertebral body in similar in the interval given difference in positioning. Electronically Signed   By: Dorise Bullion III M.D   On: 08/21/2016 16:56    Procedures Procedures (including critical care time)  Medications Ordered in ED Medications  vancomycin (VANCOCIN) IVPB 1000 mg/200 mL premix (not administered)  sodium chloride 0.9 % bolus 2,000 mL (not administered)  potassium chloride SA (K-DUR,KLOR-CON) CR tablet 40 mEq (not administered)  aztreonam (AZACTAM) 2 g in dextrose 5 % 50 mL IVPB (not administered)     Initial Impression / Assessment and Plan / ED Course  I have reviewed the triage vital signs and the nursing notes.  Pertinent labs & imaging results that were available during my care of the patient were reviewed by me and considered in my medical decision making (see chart for details).  Clinical Course as of Aug 21 1724  Mon Aug 21, 2016  1701 CXR reviewed.  No sign of PTX.   RML PNA.  Will start abx.  Will give fluid bolus  [JK]    Clinical Course User Index [JK] Dorie Rank, MD    Patient presented to the emergency room with complaints of right-sided chest pain. Patient's had respiratory symptoms including cough and congestion. X-ray today shows a right middle lobe pneumonia. Suspect this is the source of her pain and discomfort. Increase in her d-dimer but at this point and do not think that clinically significant.  Extreme leukocytosis suspect related to her infection.  No history of leukemia.  No recent steroid use.  Wll need to  monitor.    Her exam and findings are consistent with pneumonia. Her  blood pressure is stable. I doubt sepsis. I have ordered a fluid bolus and antibiotics to cover for pneumonia. I will consult the medical service for admission.  Final Clinical Impressions(s) / ED Diagnoses   Final diagnoses:  Pneumonia of right middle lobe due to infectious organism [facility])        Dorie Rank, MD 08/22/16 [phone]

## 2016-08-22 ENCOUNTER — Inpatient Hospital Stay (HOSPITAL_COMMUNITY): Payer: Medicare Other

## 2016-08-22 LAB — MAGNESIUM: MAGNESIUM: 2.3 mg/dL (ref 1.7–2.4)

## 2016-08-22 LAB — COMPREHENSIVE METABOLIC PANEL
ALBUMIN: 1.7 g/dL — AB (ref 3.5–5.0)
ALT: 14 U/L (ref 14–54)
ANION GAP: 10 (ref 5–15)
AST: 15 U/L (ref 15–41)
Alkaline Phosphatase: 150 U/L — ABNORMAL HIGH (ref 38–126)
BUN: 12 mg/dL (ref 6–20)
CHLORIDE: 92 mmol/L — AB (ref 101–111)
CO2: 33 mmol/L — ABNORMAL HIGH (ref 22–32)
Calcium: 7.9 mg/dL — ABNORMAL LOW (ref 8.9–10.3)
Creatinine, Ser: 0.31 mg/dL — ABNORMAL LOW (ref 0.44–1.00)
GFR calc non Af Amer: >60 mL/min (ref >60)
GLUCOSE: 77 mg/dL (ref 65–99)
Potassium: 3.3 mmol/L — ABNORMAL LOW (ref 3.5–5.1)
Sodium: 135 mmol/L (ref 135–145)
Total Bilirubin: 0.3 mg/dL (ref 0.3–1.2)
Total Protein: 4.7 g/dL — ABNORMAL LOW (ref 6.5–8.1)

## 2016-08-22 LAB — CBC WITH DIFFERENTIAL/PLATELET
BASOS ABS: 0 10*3/uL (ref 0.0–0.1)
BASOS PCT: 0 %
EOS ABS: 0.1 10*3/uL (ref 0.0–0.7)
Eosinophils Relative: 0 %
HCT: 27.4 % — ABNORMAL LOW (ref 36.0–46.0)
HEMOGLOBIN: 8.7 g/dL — AB (ref 12.0–15.0)
Lymphocytes Relative: 4 %
Lymphs Abs: 1.6 10*3/uL (ref 0.7–4.0)
MCH: 28.5 pg (ref 26.0–34.0)
MCHC: 31.8 g/dL (ref 30.0–36.0)
MCV: 89.8 fL (ref 78.0–100.0)
MONOS PCT: 7 %
Monocytes Absolute: 2.9 10*3/uL — ABNORMAL HIGH (ref 0.1–1.0)
NEUTROS ABS: 34.5 10*3/uL — AB (ref 1.7–7.7)
NEUTROS PCT: 88 %
Platelets: 642 10*3/uL — ABNORMAL HIGH (ref 150–400)
RBC: 3.05 MIL/uL — ABNORMAL LOW (ref 3.87–5.11)
RDW: 17.5 % — AB (ref 11.5–15.5)
WBC: 39 10*3/uL — ABNORMAL HIGH (ref 4.0–10.5)

## 2016-08-22 LAB — MRSA PCR SCREENING: MRSA by PCR: NEGATIVE

## 2016-08-22 LAB — STREP PNEUMONIAE URINARY ANTIGEN: STREP PNEUMO URINARY ANTIGEN: NEGATIVE

## 2016-08-22 MED ORDER — VANCOMYCIN HCL 500 MG IV SOLR
INTRAVENOUS | Status: AC
  Filled 2016-08-22: qty 500

## 2016-08-22 MED ORDER — SODIUM CHLORIDE 0.9 % IV SOLN
INTRAVENOUS | Status: AC
  Filled 2016-08-22 (×2): qty 1

## 2016-08-22 MED ORDER — POTASSIUM CHLORIDE CRYS ER 20 MEQ PO TBCR
40.0000 meq | EXTENDED_RELEASE_TABLET | Freq: Two times a day (BID) | ORAL | Status: AC
  Administered 2016-08-22 (×2): 40 meq via ORAL
  Filled 2016-08-22 (×2): qty 2

## 2016-08-22 MED ORDER — PREDNISONE 20 MG PO TABS
40.0000 mg | ORAL_TABLET | Freq: Every day | ORAL | Status: DC
  Administered 2016-08-22 – 2016-08-27 (×6): 40 mg via ORAL
  Filled 2016-08-22 (×6): qty 2

## 2016-08-22 MED ORDER — IOPAMIDOL (ISOVUE-300) INJECTION 61%
75.0000 mL | Freq: Once | INTRAVENOUS | Status: AC | PRN
  Administered 2016-08-22: 75 mL via INTRAVENOUS

## 2016-08-22 MED ORDER — SODIUM CHLORIDE 0.9 % IV SOLN
1.0000 g | Freq: Three times a day (TID) | INTRAVENOUS | Status: DC
  Administered 2016-08-22 – 2016-08-26 (×12): 1 g via INTRAVENOUS
  Filled 2016-08-22 (×18): qty 1

## 2016-08-22 NOTE — Consult Note (Signed)
   [facility] CM Inpatient Consult   08/22/2016  Leah Dalton 07/23/1945 [id number]  Patient screened for potential [place] [facility] Management services. Patient is eligible for [place]. Electronic medical record reveals patient is a resident at [facility]. [facility] Care Management services not appropriate at this time. If patient's post hospital needs change please place a [facility] Care Management consult. For questions please contact:   Lania Zawistowski RN, [facility] Liaison  [phone]) Business Mobile [phone]) Toll free office

## 2016-08-22 NOTE — Progress Notes (Signed)
PROGRESS NOTE    Leah Dalton  HEN:[id number] DOB: [birth year] DOA: 08/21/2016 PCP: Crisoforo Oxford, MD   Brief Narrative:  Leah Dalton is a 71 y.o. female with medical history significant for COPD with chronic hypercarbic respiratory failure, GERD with esophagitis, hereditary hemachromatosis, history of polycythemia requiring therapeutic phlebotomy remotely, now presenting from her nursing facility for evaluation of progressive cough and dyspnea with right-sided pleuritic pain- 1 wk.  ED Course: Upon arrival to the ED, patient is found to be afebrile, saturating adequately on her usual supplemental oxygen, tachypneic in the high 20s, tachycardic to 127, and with stable blood pressure. EKG features sinus tachycardia with rate 120 and PVC. Chest x-ray is notable for right middle lobe pneumonia. Chemistry panel reveals a potassium of 2.7 and bicarbonate of 39. CBC was notable for a leukocytosis to 43,600, and thrombocytosis to 684,000, and a stable normocytic anemia with hemoglobin of 10.0.   Assessment & Plan:   Principal Problem:   HCAP (healthcare-associated pneumonia) Active Problems:   COPD (chronic obstructive pulmonary disease) (HCC)   Acute-on-chronic respiratory failure (HCC)   Reflux esophagitis   IDA (iron deficiency anemia)   Acute hypokalemia   Thrombocytosis (HCC)   Leukocytosis   Positive D dimer   Pneumonia of right middle lobe due to infectious organism ([place])  HCAP - from nursing facility with 1 wk of progressive dyspnea, productive cough, and right pleuritic pain. Marked leukocytosis- 43 >>39. RML consolidation on CXR.  - With significant leukocytosis, obtained a CT chest with contrast- RML cavitary pneumonia, with fluid level, other areas of pneumonia with necrotic/cavitary features consider septic embolic process., Small right pleural effusion, recommend chest CT follow-up in 3 months. - Considering CT scan of results- get echo rule out embolic process - Blood cultures  2 still pending - Sputum cultures pending - Strep pneumo negative - Consult pulmonology for recommendations - Saturating well on home 3L. - Continue vancomycin 5/28 >> - Start meropenem to include anaerobic COVERAGE in addition to gram-negative's, with cavity/necrosis on CT chest 5/29>> - D/c aztreonam 5/28 >> 5/29 - nebs, mucolytic   COPD with chronic hypercarbic respiratory failure, steroid-dependent- no wheezing, stable. On home O2 - Continue scheduled Breo,  - Chronic prednisone 10 mg , we increased dose to 40 mg daily - stress dose   Hypokalemia - 2.7 >>3.3  - replete - BMp am  Leukocytosis, thrombocytosis- likely reactive- 2/2 pneumonia, cavitary lesions on imaging. On chronic prednisone therapy. - PBS ordered on admission, pending  GERD - EGD in 2015 with erosive esophagitis  - Managed at home with daily PPI, will continue   Normocytic anemia- 10 >>8.  - No evidence of blood loss at this time, likely due to hydration -  trend  7. D-dimer elevation - D-dimer mildly elevated to 0.83 in Ed, most likely secondary to PNA, cavitary/necrotic lung lesions. - Chronic lower extremity swelling, stasis changes - BLE venous US - negative   DVT prophylaxis: sq Lovenox Code Status: Full  Family Communication: Discussed with patient Disposition Plan: Admit to telemetry Consults called: None Admission status: Inpatient   Consultants:   Pulmonology  Procedures: None   Antimicrobials:   Vanc- 2/28 >>  Meropenem 5/29 >>  Aztreonam 5/28 >> 5/29.   Subjective: Patient reports she doesn't feel better today- feels ill. She is on 3 L.  Objective: Vitals:   08/21/16 1948 08/22/16 0500 08/22/16 1110 08/22/16 1537  BP: 125/79 130/68  (!) 147/70  Pulse: (!) 112 93  (!) 106  Resp: 18 18  20   Temp: 98.2 F (36.8 C) 97.4 F (36.3 C)  98.8 F (37.1 C)  TempSrc: Oral Oral  Oral  SpO2: 97% 100% 98% 98%  Weight: 61.2 kg (134 lb 14.4 oz)     Height: 5\' 4"  (1.626 m)        Intake/Output Summary (Last 24 hours) at 08/22/16 1739 Last data filed at 08/22/16 1724  Gross per 24 hour  Intake          1223.33 ml  Output              350 ml  Net           873.33 ml   Filed Weights   08/21/16 1517 08/21/16 1948  Weight: 60.8 kg (134 lb) 61.2 kg (134 lb 14.4 oz)    Examination:  General exam: Appears calm and comfortable  Respiratory system: Crackles- worse left, breath sounds congested.No Cardiovascular system: S1 & S2 heard, RRR. Chronic 1+ pitting pedal edema bilaterally. Gastrointestinal system: Abdomen is nondistended, soft and nontender. No organomegaly or masses felt. Normal bowel sounds heard. Central nervous system: Alert and oriented. No focal neurological deficits. Extremities: No acute abnormalities, moving all extremities Skin: No rashes, lesions or ulcers Psychiatry: Judgement and insight appear normal. Mood & affect appropriate.   Data Reviewed: I have personally reviewed following labs and imaging studies  CBC:  Recent Labs Lab 08/21/16 1540 08/22/16 0559  WBC 43.6* 39.0*  NEUTROABS 38.7* 34.5*  HGB 10.0* 8.7*  HCT 31.7* 27.4*  MCV 90.3 89.8  PLT 684* 235*   Basic Metabolic Panel:  Recent Labs Lab 08/21/16 1540 08/22/16 0559  NA 134* 135  K 2.7* 3.3*  CL 84* 92*  CO2 39* 33*  GLUCOSE 113* 77  BUN 15 12  CREATININE 0.42* 0.31*  CALCIUM 8.7* 7.9*  MG  --  2.3   Liver Function Tests:  Recent Labs Lab 08/22/16 0559  AST 15  ALT 14  ALKPHOS 150*  BILITOT 0.3  PROT 4.7*  ALBUMIN 1.7*   CBG:  Recent Labs Lab 08/21/16 1601  GLUCAP 121*   Sepsis Labs:  Recent Labs Lab 08/21/16 1729  LATICACIDVEN 1.63    Recent Results (from the past 240 hour(s))  Blood culture (routine x 2)     Status: None (Preliminary result)   Collection Time: 08/21/16  5:19 PM  Result Value Ref Range Status   Specimen Description BLOOD LEFT ARM  Final   Special Requests   Final    BOTTLES DRAWN AEROBIC ONLY Blood Culture  results may not be optimal due to an inadequate volume of blood received in culture bottles   Culture NO GROWTH < 24 HOURS  Final   Report Status PENDING  Incomplete  Blood culture (routine x 2)     Status: None (Preliminary result)   Collection Time: 08/21/16  5:19 PM  Result Value Ref Range Status   Specimen Description LEFT ANTECUBITAL  Final   Special Requests   Final    BOTTLES DRAWN AEROBIC ONLY Blood Culture results may not be optimal due to an inadequate volume of blood received in culture bottles   Culture NO GROWTH < 24 HOURS  Final   Report Status PENDING  Incomplete  MRSA PCR Screening     Status: None   Collection Time: 08/22/16  1:40 AM  Result Value Ref Range Status   MRSA by PCR NEGATIVE NEGATIVE Final    Comment:  The GeneXpert MRSA Assay (FDA approved for NASAL specimens only), is one component of a comprehensive MRSA colonization surveillance program. It is not intended to diagnose MRSA infection nor to guide or monitor treatment for MRSA infections.      Radiology Studies: Dg Chest 2 View  Result Date: 08/21/2016 CLINICAL DATA:  Cough. EXAM: CHEST  2 VIEW COMPARISON:  April 20, 2016 FINDINGS: Right middle lobe infiltrate, new in the interval. No other interval changes or acute abnormalities. IMPRESSION: 1. Right middle lobe pneumonia.  Recommend follow-up to resolution. 2. Mild anterior wedging of a lower thoracic vertebral body in similar in the interval given difference in positioning. Electronically Signed   By: Dorise Bullion III M.D   On: 08/21/2016 16:56   Ct Chest W Contrast  Result Date: 08/22/2016 CLINICAL DATA:  Pneumonia.  Significantly this leukocytosis (43) EXAM: CT CHEST WITH CONTRAST TECHNIQUE: Multidetector CT imaging of the chest was performed during intravenous contrast administration. CONTRAST:  19mL ISOVUE-300 IOPAMIDOL (ISOVUE-300) INJECTION 61% COMPARISON:  09/30/2008 FINDINGS: Cardiovascular: Normal heart size. Trace pericardial  fluid that is low-density. No acute vascular finding. Atherosclerotic calcification. Mediastinum/Nodes: Mild prominence of hilar lymph nodes, considered reactive in this setting. Lungs/Pleura: There are patchy areas of airspace disease seen in the right upper lobe (low-density lobulated abnormality seen anteriorly and more consolidative hypoenhancing consolidations seen medially). There is extensive consolidation in the right middle lobe with an internal cavitation containing fluid level. There is no enhancing capsule to the cavity; size is at least 2 cm. Small nodular opacity in the medial left upper lobe. 9 mm solid nodule in the posterior right upper lobe which does not appear similar to the inflammatory/infectious opacities. Centrilobular emphysema and diffuse airway thickening. Trace right pleural effusion which appears layering. Upper Abdomen: No acute finding. There may be steatosis of the liver. Musculoskeletal: Anterior left third and fourth rib fractures with subacute to chronic appearance. Multiple healing right rib fractures. Chronic appearing T12 superior endplate deformity. IMPRESSION: 1. Right middle lobe cavitary pneumonia with fluid level. The cavity measures at least 2 cm. 2. There are other areas of pneumonia with necrotic/cavitary features, consider septic embolic process. 3. Small layering right pleural effusion. 4. 9 mm right upper lobe nodule that does not appear infectious/inflammatory. Recommend chest CT follow-up in 3 months. 5. Healing bilateral rib fractures. 6.  Emphysema (ICD10-J43.9). Electronically Signed   By: Monte Fantasia M.D.   On: 08/22/2016 16:27   US Venous Img Lower Bilateral  Result Date: 08/22/2016 CLINICAL DATA:  71 year old female with bilateral lower extremity edema, shortness of breath, abnormal D-dimer. EXAM: BILATERAL LOWER EXTREMITY VENOUS DOPPLER ULTRASOUND TECHNIQUE: Gray-scale sonography with graded compression, as well as color Doppler and duplex ultrasound  were performed to evaluate the lower extremity deep venous systems from the level of the common femoral vein and including the common femoral, femoral, profunda femoral, popliteal and calf veins including the posterior tibial, peroneal and gastrocnemius veins when visible. The superficial great saphenous vein was also interrogated. Spectral Doppler was utilized to evaluate flow at rest and with distal augmentation maneuvers in the common femoral, femoral and popliteal veins. COMPARISON:  None. FINDINGS: RIGHT LOWER EXTREMITY Common Femoral Vein: No evidence of thrombus. Normal compressibility, respiratory phasicity and response to augmentation. Saphenofemoral Junction: No evidence of thrombus. Normal compressibility and flow on color Doppler imaging. Profunda Femoral Vein: No evidence of thrombus. Normal compressibility and flow on color Doppler imaging. Femoral Vein: No evidence of thrombus. Normal compressibility, respiratory phasicity and response to augmentation.  Popliteal Vein: No evidence of thrombus. Normal compressibility, respiratory phasicity and response to augmentation. Calf Veins: No evidence of thrombus. Normal compressibility and flow on color Doppler imaging. Superficial Great Saphenous Vein: No evidence of thrombus. Normal compressibility and flow on color Doppler imaging. Venous Reflux:  None. Other Findings:  None. LEFT LOWER EXTREMITY Common Femoral Vein: No evidence of thrombus. Normal compressibility, respiratory phasicity and response to augmentation. Saphenofemoral Junction: No evidence of thrombus. Normal compressibility and flow on color Doppler imaging. Profunda Femoral Vein: No evidence of thrombus. Normal compressibility and flow on color Doppler imaging. Femoral Vein: No evidence of thrombus. Normal compressibility, respiratory phasicity and response to augmentation. Popliteal Vein: No evidence of thrombus. Normal compressibility, respiratory phasicity and response to augmentation. Calf  Veins: No evidence of thrombus. Normal compressibility and flow on color Doppler imaging. Superficial Great Saphenous Vein: No evidence of thrombus. Normal compressibility and flow on color Doppler imaging. Venous Reflux:  None. Other Findings:  None. IMPRESSION: No evidence of DVT within either lower extremity. Electronically Signed   By: Genevie Ann M.D.   On: 08/22/2016 08:58   Scheduled Meds: . enoxaparin (LOVENOX) injection  40 mg Subcutaneous Q24H  . fluticasone furoate-vilanterol  1 puff Inhalation Daily  . guaiFENesin  1,200 mg Oral BID  . mirtazapine  15 mg Oral QHS  . pantoprazole  40 mg Oral Daily  . potassium chloride  40 mEq Oral BID  . predniSONE  40 mg Oral Q breakfast   Continuous Infusions: . aztreonam 2 g (08/22/16 1738)  . vancomycin Stopped (08/22/16 0716)     LOS: 1 day   Bethena Roys, MD [facility] Pager [phone].  If 7PM-7AM, please contact night-coverage [email or web address] Password [facility] 08/22/2016, 5:39 PM

## 2016-08-22 NOTE — Progress Notes (Signed)
Pharmacy Antibiotic Note  Leah Dalton is a 71 y.o. female admitted on 08/21/2016 with pneumonia.  Pharmacy has been consulted for vancomycin and meropenem dosing.  Plan: Meropenem 1gm IV q8hrs Continue Vancomycin 500 mg IV q12 hours F/u renal function, cultures and clinical course  Height: 5\' 4"  (162.6 cm) Weight: 134 lb 14.4 oz (61.2 kg) IBW/kg (Calculated) : 54.7  Temp (24hrs), Avg:98.2 F (36.8 C), Min:97.4 F (36.3 C), Max:98.8 F (37.1 C)   Recent Labs Lab 08/21/16 1540 08/21/16 1729 08/22/16 0559  WBC 43.6*  --  39.0*  CREATININE 0.42*  --  0.31*  LATICACIDVEN  --  1.63  --     Estimated Creatinine Clearance: 55.7 mL/min (A) (by C-G formula based on SCr of 0.31 mg/dL (L)).    Allergies  Allergen Reactions  . Codeine Itching  . Daliresp [Roflumilast] Diarrhea  . Keflex [Cephalexin] Other (See Comments)    Gives pt yeast infection  . Penicillins Itching    Has patient had a PCN reaction causing immediate rash, facial/tongue/throat swelling, SOB or lightheadedness with hypotension: No Has patient had a PCN reaction causing severe rash involving mucus membranes or skin necrosis: Yes Has patient had a PCN reaction that required hospitalization No Has patient had a PCN reaction occurring within the last 10 years: No If all of the above answers are "NO", then may proceed with Cephalosporin use.    Thank you for allowing pharmacy to be a part of this patient's care.  Hart Robinsons A 08/22/2016 6:12 PM

## 2016-08-23 ENCOUNTER — Inpatient Hospital Stay (HOSPITAL_COMMUNITY): Payer: Medicare Other

## 2016-08-23 DIAGNOSIS — J181 Lobar pneumonia, unspecified organism: Secondary | ICD-10-CM

## 2016-08-23 DIAGNOSIS — I313 Pericardial effusion (noninflammatory): Secondary | ICD-10-CM

## 2016-08-23 DIAGNOSIS — D72823 Leukemoid reaction: Secondary | ICD-10-CM

## 2016-08-23 DIAGNOSIS — J189 Pneumonia, unspecified organism: Secondary | ICD-10-CM

## 2016-08-23 DIAGNOSIS — R7989 Other specified abnormal findings of blood chemistry: Secondary | ICD-10-CM

## 2016-08-23 DIAGNOSIS — D500 Iron deficiency anemia secondary to blood loss (chronic): Secondary | ICD-10-CM

## 2016-08-23 DIAGNOSIS — J9621 Acute and chronic respiratory failure with hypoxia: Secondary | ICD-10-CM

## 2016-08-23 DIAGNOSIS — D473 Essential (hemorrhagic) thrombocythemia: Secondary | ICD-10-CM

## 2016-08-23 DIAGNOSIS — K210 Gastro-esophageal reflux disease with esophagitis: Secondary | ICD-10-CM

## 2016-08-23 DIAGNOSIS — E876 Hypokalemia: Secondary | ICD-10-CM

## 2016-08-23 LAB — BASIC METABOLIC PANEL
Anion gap: 7 (ref 5–15)
BUN: 6 mg/dL (ref 6–20)
CALCIUM: 8.4 mg/dL — AB (ref 8.9–10.3)
CO2: 34 mmol/L — ABNORMAL HIGH (ref 22–32)
Chloride: 95 mmol/L — ABNORMAL LOW (ref 101–111)
Glucose, Bld: 88 mg/dL (ref 65–99)
POTASSIUM: 4 mmol/L (ref 3.5–5.1)
Sodium: 136 mmol/L (ref 135–145)

## 2016-08-23 LAB — ECHOCARDIOGRAM COMPLETE
Height: 64 in
WEIGHTICAEL: 2158.4 [oz_av]

## 2016-08-23 LAB — EXPECTORATED SPUTUM ASSESSMENT W GRAM STAIN, RFLX TO RESP C

## 2016-08-23 LAB — CBC
HCT: 26.6 % — ABNORMAL LOW (ref 36.0–46.0)
Hemoglobin: 8.4 g/dL — ABNORMAL LOW (ref 12.0–15.0)
MCH: 28.6 pg (ref 26.0–34.0)
MCHC: 31.6 g/dL (ref 30.0–36.0)
MCV: 90.5 fL (ref 78.0–100.0)
PLATELETS: 670 10*3/uL — AB (ref 150–400)
RBC: 2.94 MIL/uL — AB (ref 3.87–5.11)
RDW: 17.5 % — ABNORMAL HIGH (ref 11.5–15.5)
WBC: 27.1 10*3/uL — AB (ref 4.0–10.5)

## 2016-08-23 LAB — HIV ANTIBODY (ROUTINE TESTING W REFLEX): HIV Screen 4th Generation wRfx: NONREACTIVE

## 2016-08-23 MED ORDER — GUAIFENESIN 100 MG/5ML PO SOLN
30.0000 mL | Freq: Four times a day (QID) | ORAL | Status: DC
  Administered 2016-08-23 – 2016-08-28 (×21): 600 mg via ORAL
  Filled 2016-08-23 (×29): qty 30

## 2016-08-23 MED ORDER — PANTOPRAZOLE SODIUM 40 MG PO PACK
40.0000 mg | PACK | Freq: Every day | ORAL | Status: DC
  Administered 2016-08-23 – 2016-08-28 (×6): 40 mg
  Filled 2016-08-23 (×6): qty 20

## 2016-08-23 MED ORDER — LIDOCAINE 5 % EX PTCH
1.0000 | MEDICATED_PATCH | CUTANEOUS | Status: DC
Start: 2016-08-23 — End: 2016-08-28
  Administered 2016-08-23 – 2016-08-28 (×6): 1 via TRANSDERMAL
  Filled 2016-08-23 (×6): qty 1

## 2016-08-23 NOTE — Progress Notes (Signed)
PROGRESS NOTE    Leah Dalton  MAU:[id number] DOB: 1945-10-08 DOA: 08/21/2016 PCP: Crisoforo Oxford, MD    Brief Narrative:  Leah Dalton Halbrookis a 71 y.o.femalewith medical history significant for COPD with chronic hypercarbic respiratory failure, GERD with esophagitis, hereditary hemachromatosis, history of polycythemia requiring therapeutic phlebotomy remotely, now presenting from her nursing facility for evaluation of progressive cough and dyspnea with right-sided pleuritic pain- 1 wk.  ED Course:Upon arrival to the ED, patient is found to be afebrile, saturating adequately on her usual supplemental oxygen, tachypneic in the high 20s, tachycardic to 127, and with stable blood pressure. EKG features sinus tachycardia with rate 120 and PVC. Chest x-ray is notable for right middle lobe pneumonia. Chemistry panel reveals a potassium of 2.7 and bicarbonate of 39. CBC was notable for a leukocytosis to 43,600, and thrombocytosis to 684,000, and a stable normocytic anemia with hemoglobin of 10.0.  CT showing numerous, possibly septic, embolic.  Pulmonology consulted.  Echocardiogram ordered.   Assessment & Plan:   Principal Problem:   HCAP (healthcare-associated pneumonia) Active Problems:   COPD (chronic obstructive pulmonary disease) (HCC)   Acute-on-chronic respiratory failure (HCC)   Reflux esophagitis   IDA (iron deficiency anemia)   Acute hypokalemia   Thrombocytosis (HCC)   Leukocytosis   Positive D dimer   Pneumonia of right middle lobe due to infectious organism (Leah Dalton)   HCAP - from nursing facility with 1 wk of progressive dyspnea, productive cough, and right pleuritic pain. Marked leukocytosis- 43 >>39. RML consolidation on CXR.  - CT chest with contrast- RML cavitary pneumonia, with fluid level, other areas of pneumonia with necrotic/cavitary features consider septic embolic process., Small right pleural effusion, recommend chest CT follow-up in 3 months. - echocardiogram done  today - Blood cultures 2: NG x 2 days - Strep pneumo negative - Pulmonology consulted - Saturating well on home 3L. - Continue vancomycin 5/28 >> - Start meropenem to include anaerobic COVERAGE in addition to gram-negative's, with cavity/necrosis on CT chest 5/29>> - nebs, mucolytic   COPD with chronic hypercarbic respiratory failure, steroid-dependent - no wheezing, stable. On home O2 - Continue scheduled Breo,  - Chronic prednisone 10 mg , we increased dose to 40 mg daily - stress dose   Hypokalemia - 4.0 this am  Leukocytosis, thrombocytosis - likely reactive- 2/2 pneumonia, cavitary lesions on imaging. On chronic prednisone therapy. - peripheral blood smear ordered  GERD - continue PPI   Normocytic anemia- 10 >>8.  - No evidence of blood loss at this time, likely due to hydration -  continue to monitor - hemoccult stool  D-dimer elevation - D-dimer mildly elevated to 0.83 in Ed, most likely secondary to PNA, cavitary/necrotic lung lesions. - Chronic lower extremity swelling, stasis changes - BLE venous US - negative   DVT prophylaxis:sq Lovenox Code Status:Full  Family Communication:Discussed with patient Disposition Plan: pending improvement   Consultants:   Pulmonology  Procedures:   Echocardiogram  Antimicrobials:   Meropenem  Vancomycin    Subjective: Patient seen and examined.  She voices her chest is sore from significant coughing.  Voices she feels weak.  Objective: Vitals:   08/23/16 0615 08/23/16 0820 08/23/16 0900 08/23/16 1423  BP: 113/68  137/67 127/69  Pulse: 98  (!) 109 97  Resp: 20  20 18   Temp: 98.7 F (37.1 C)   97.9 F (36.6 C)  TempSrc: Oral   Oral  SpO2:  96% 100% 100%  Weight:      Height:  Intake/Output Summary (Last 24 hours) at 08/23/16 1647 Last data filed at 08/23/16 1300  Gross per 24 hour  Intake             1360 ml  Output             1500 ml  Net             -140 ml   Filed Weights    08/21/16 1517 08/21/16 1948  Weight: 60.8 kg (134 lb) 61.2 kg (134 lb 14.4 oz)    Examination:  General exam: Appears calm and comfortable  Respiratory system: no accessory muscle use, rhonchi on the right side Cardiovascular system: S1 & S2 heard, RRR. No JVD, murmurs, rubs, gallops or clicks. No pedal edema. Gastrointestinal system: Abdomen is nondistended, soft and nontender. No organomegaly or masses felt. Normal bowel sounds heard. Central nervous system: Alert and oriented. No focal neurological deficits. Extremities: Symmetric 5 x 5 power. Skin: No rashes, lesions or ulcers Psychiatry:  Mood & affect appropriate.     Data Reviewed: I have personally reviewed following labs and imaging studies  CBC:  Recent Labs Lab 08/21/16 1540 08/22/16 0559 08/23/16 0545  WBC 43.6* 39.0* 27.1*  NEUTROABS 38.7* 34.5*  --   HGB 10.0* 8.7* 8.4*  HCT 31.7* 27.4* 26.6*  MCV 90.3 89.8 90.5  PLT 684* 642* 510*   Basic Metabolic Panel:  Recent Labs Lab 08/21/16 1540 08/22/16 0559 08/23/16 0545  NA 134* 135 136  K 2.7* 3.3* 4.0  CL 84* 92* 95*  CO2 39* 33* 34*  GLUCOSE 113* 77 88  BUN 15 12 6   CREATININE 0.42* 0.31* <0.30*  CALCIUM 8.7* 7.9* 8.4*  MG  --  2.3  --    GFR: CrCl cannot be calculated (This lab value cannot be used to calculate CrCl because it is not a number: <0.30). Liver Function Tests:  Recent Labs Lab 08/22/16 0559  AST 15  ALT 14  ALKPHOS 150*  BILITOT 0.3  PROT 4.7*  ALBUMIN 1.7*   No results for input(s): LIPASE, AMYLASE in the last 168 hours. No results for input(s): AMMONIA in the last 168 hours. Coagulation Profile: No results for input(s): INR, PROTIME in the last 168 hours. Cardiac Enzymes: No results for input(s): CKTOTAL, CKMB, CKMBINDEX, TROPONINI in the last 168 hours. BNP (last 3 results) No results for input(s): PROBNP in the last 8760 hours. HbA1C: No results for input(s): HGBA1C in the last 72 hours. CBG:  Recent Labs Lab  08/21/16 1601  GLUCAP 121*   Lipid Profile: No results for input(s): CHOL, HDL, LDLCALC, TRIG, CHOLHDL, LDLDIRECT in the last 72 hours. Thyroid Function Tests: No results for input(s): TSH, T4TOTAL, FREET4, T3FREE, THYROIDAB in the last 72 hours. Anemia Panel: No results for input(s): VITAMINB12, FOLATE, FERRITIN, TIBC, IRON, RETICCTPCT in the last 72 hours. Sepsis Labs:  Recent Labs Lab 08/21/16 1729  LATICACIDVEN 1.63    Recent Results (from the past 240 hour(s))  Blood culture (routine x 2)     Status: None (Preliminary result)   Collection Time: 08/21/16  5:19 PM  Result Value Ref Range Status   Specimen Description BLOOD LEFT ARM  Final   Special Requests   Final    BOTTLES DRAWN AEROBIC ONLY Blood Culture results may not be optimal due to an inadequate volume of blood received in culture bottles   Culture NO GROWTH 2 DAYS  Final   Report Status PENDING  Incomplete  Blood culture (routine x 2)  Status: None (Preliminary result)   Collection Time: 08/21/16  5:19 PM  Result Value Ref Range Status   Specimen Description LEFT ANTECUBITAL  Final   Special Requests   Final    BOTTLES DRAWN AEROBIC ONLY Blood Culture results may not be optimal due to an inadequate volume of blood received in culture bottles   Culture NO GROWTH 2 DAYS  Final   Report Status PENDING  Incomplete  MRSA PCR Screening     Status: None   Collection Time: 08/22/16  1:40 AM  Result Value Ref Range Status   MRSA by PCR NEGATIVE NEGATIVE Final    Comment:        The GeneXpert MRSA Assay (FDA approved for NASAL specimens only), is one component of a comprehensive MRSA colonization surveillance program. It is not intended to diagnose MRSA infection nor to guide or monitor treatment for MRSA infections.          Radiology Studies: Dg Chest 2 View  Result Date: 08/21/2016 CLINICAL DATA:  Cough. EXAM: CHEST  2 VIEW COMPARISON:  April 20, 2016 FINDINGS: Right middle lobe infiltrate, new  in the interval. No other interval changes or acute abnormalities. IMPRESSION: 1. Right middle lobe pneumonia.  Recommend follow-up to resolution. 2. Mild anterior wedging of a lower thoracic vertebral body in similar in the interval given difference in positioning. Electronically Signed   By: Dorise Bullion III M.D   On: 08/21/2016 16:56   Ct Chest W Contrast  Result Date: 08/22/2016 CLINICAL DATA:  Pneumonia.  Significantly this leukocytosis (43) EXAM: CT CHEST WITH CONTRAST TECHNIQUE: Multidetector CT imaging of the chest was performed during intravenous contrast administration. CONTRAST:  33mL ISOVUE-300 IOPAMIDOL (ISOVUE-300) INJECTION 61% COMPARISON:  09/30/2008 FINDINGS: Cardiovascular: Normal heart size. Trace pericardial fluid that is low-density. No acute vascular finding. Atherosclerotic calcification. Mediastinum/Nodes: Mild prominence of hilar lymph nodes, considered reactive in this setting. Lungs/Pleura: There are patchy areas of airspace disease seen in the right upper lobe (low-density lobulated abnormality seen anteriorly and more consolidative hypoenhancing consolidations seen medially). There is extensive consolidation in the right middle lobe with an internal cavitation containing fluid level. There is no enhancing capsule to the cavity; size is at least 2 cm. Small nodular opacity in the medial left upper lobe. 9 mm solid nodule in the posterior right upper lobe which does not appear similar to the inflammatory/infectious opacities. Centrilobular emphysema and diffuse airway thickening. Trace right pleural effusion which appears layering. Upper Abdomen: No acute finding. There may be steatosis of the liver. Musculoskeletal: Anterior left third and fourth rib fractures with subacute to chronic appearance. Multiple healing right rib fractures. Chronic appearing T12 superior endplate deformity. IMPRESSION: 1. Right middle lobe cavitary pneumonia with fluid level. The cavity measures at least  2 cm. 2. There are other areas of pneumonia with necrotic/cavitary features, consider septic embolic process. 3. Small layering right pleural effusion. 4. 9 mm right upper lobe nodule that does not appear infectious/inflammatory. Recommend chest CT follow-up in 3 months. 5. Healing bilateral rib fractures. 6.  Emphysema (ICD10-J43.9). Electronically Signed   By: Monte Fantasia M.D.   On: 08/22/2016 16:27   US Venous Img Lower Bilateral  Result Date: 08/22/2016 CLINICAL DATA:  71 year old female with bilateral lower extremity edema, shortness of breath, abnormal D-dimer. EXAM: BILATERAL LOWER EXTREMITY VENOUS DOPPLER ULTRASOUND TECHNIQUE: Gray-scale sonography with graded compression, as well as color Doppler and duplex ultrasound were performed to evaluate the lower extremity deep venous systems from the level of  the common femoral vein and including the common femoral, femoral, profunda femoral, popliteal and calf veins including the posterior tibial, peroneal and gastrocnemius veins when visible. The superficial great saphenous vein was also interrogated. Spectral Doppler was utilized to evaluate flow at rest and with distal augmentation maneuvers in the common femoral, femoral and popliteal veins. COMPARISON:  None. FINDINGS: RIGHT LOWER EXTREMITY Common Femoral Vein: No evidence of thrombus. Normal compressibility, respiratory phasicity and response to augmentation. Saphenofemoral Junction: No evidence of thrombus. Normal compressibility and flow on color Doppler imaging. Profunda Femoral Vein: No evidence of thrombus. Normal compressibility and flow on color Doppler imaging. Femoral Vein: No evidence of thrombus. Normal compressibility, respiratory phasicity and response to augmentation. Popliteal Vein: No evidence of thrombus. Normal compressibility, respiratory phasicity and response to augmentation. Calf Veins: No evidence of thrombus. Normal compressibility and flow on color Doppler imaging.  Superficial Great Saphenous Vein: No evidence of thrombus. Normal compressibility and flow on color Doppler imaging. Venous Reflux:  None. Other Findings:  None. LEFT LOWER EXTREMITY Common Femoral Vein: No evidence of thrombus. Normal compressibility, respiratory phasicity and response to augmentation. Saphenofemoral Junction: No evidence of thrombus. Normal compressibility and flow on color Doppler imaging. Profunda Femoral Vein: No evidence of thrombus. Normal compressibility and flow on color Doppler imaging. Femoral Vein: No evidence of thrombus. Normal compressibility, respiratory phasicity and response to augmentation. Popliteal Vein: No evidence of thrombus. Normal compressibility, respiratory phasicity and response to augmentation. Calf Veins: No evidence of thrombus. Normal compressibility and flow on color Doppler imaging. Superficial Great Saphenous Vein: No evidence of thrombus. Normal compressibility and flow on color Doppler imaging. Venous Reflux:  None. Other Findings:  None. IMPRESSION: No evidence of DVT within either lower extremity. Electronically Signed   By: Genevie Ann M.D.   On: 08/22/2016 08:58        Scheduled Meds: . enoxaparin (LOVENOX) injection  40 mg Subcutaneous Q24H  . fluticasone furoate-vilanterol  1 puff Inhalation Daily  . guaiFENesin  30 mL Oral Q6H  . lidocaine  1 patch Transdermal Q24H  . mirtazapine  15 mg Oral QHS  . pantoprazole sodium  40 mg Per Tube Daily  . predniSONE  40 mg Oral Q breakfast   Continuous Infusions: . meropenem (MERREM) IV 1 g (08/23/16 1006)  . vancomycin 500 mg (08/23/16 0545)     LOS: 2 days    Time spent: 30 minutes    Loretha Stapler, MD [facility] Pager [phone]  If 7PM-7AM, please contact night-coverage [email or web address] Password [facility] 08/23/2016, 4:47 PM

## 2016-08-23 NOTE — Care Management Note (Signed)
Case Management Note  Patient Details  Name: Leah Dalton MRN: [id number] Date of Birth: 06-16-1945  Subjective/Objective:                  Admitted with HCAP. Chart reviewed for CM needs. Pt from [facility].   Action/Plan: Anticipate DC back to SNF at DC. CSW following and will make arrangements for return to facility. No CM needs anticipated.   Expected Discharge Date:  08/23/16               Expected Discharge Plan:  [place]  In-House Referral:  Clinical Social Work  Discharge planning Services  NA  Post Acute Care Choice:  NA Choice offered to:  NA  Status of Service:  Completed, signed off  Sherald Barge, RN 08/23/2016, 9:40 AM

## 2016-08-23 NOTE — Consult Note (Signed)
Consult requested by: [facility],Dr. Adair Patter Consult requested for: Healthcare associated pneumonia  HPI: This is a 71 year old with a long known history of COPD and chronic hypoxic and hypercapnic respiratory failure. She has been living in a skilled care facility. She's had cough and congestion for about a week and at that time was having right-sided pleuritic chest pain. She says she's bringing up yellow sputum. She's not had any fever that she is aware of. She is more short of breath. She has noticed swelling of her legs which she says is chronic. Evaluation in the emergency department showed right middle lobe pneumonia on chest x-ray. I personally reviewed that film. She later had CT of the chest which I also personally reviewed and it shows that she has several areas including the right middle lobe with cavities and this is at least suggestive that she might have septic emboli. She has had very high white blood cell count. 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 She currently denies chest pain nausea vomiting diarrhea but she is short of breath and she still has cough and congestion. She still has significant edema of her extremities.  Past Medical History:  Diagnosis Date  . AVM (arteriovenous malformation) of colon   . COPD (chronic obstructive pulmonary disease) ([place])    O2 dependent, started 05/31/2012  . Diverticula of colon   . Esophagitis   . GERD (gastroesophageal reflux disease)   . 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 History of tobacco abuse   . Osteoporosis   . Polycythemia    Has had to have phlebotomies in the pas     History reviewed. No pertinent family history. She does have family history of COPD and of hemochromatosis.  Social History   Social History  . Marital status: Divorced    Spouse name: N/A  . Number of children:  N/A  . Years of education: N/A   Social History Main Topics  . Smoking status: Former Smoker    Quit date: 2012  . Smokeless tobacco: Never Used  . Alcohol use No  . Drug use: No  . Sexual activity: Not Asked   Other Topics Concern  . None   Social History Narrative  . None     ROS: Except as mentioned 10 point review of systems is negative    Objective: Vital signs in last 24 hours: Temp:  [98.6 F (37 C)-98.8 F (37.1 C)] 98.7 F (37.1 C) (05/30 0615) Pulse Rate:  [86-106] 98 (05/30 0615) Resp:  [20] 20 (05/30 0615) BP: (113-147)/(68-72) 113/68 (05/30 0615) SpO2:  [96 %-100 %] 96 % (05/30 0820) Weight change:  Last BM Date: 08/22/16  Intake/Output from previous day: 05/29 0701 - 05/30 0700 In: 1360 [P.O.:960; IV Piggyback:400] Out: 1600 [Urine:1600]  PHYSICAL EXAM Constitutional: She is awake and alert and in no acute distress. Cardiovascular: Her heart is regular with normal heart sounds. Respiratory: She has rhonchi on the right. None on the left. Eyes: Pupils react ears nose mouth and throat: Hearing is grossly normal. Her throat is clear. Gastrointestinal: Her abdomen is soft. I cannot feel her liver. Skin: Warm and dry. She has significant edema of her feet and also has what looks like some dependent edema of her elbow area. Neurological: No focal abnormalities musculoskeletal: She is generally weak. Psychiatric: Normal mood and affect  Lab Results: Basic Metabolic Panel:  Recent  Labs  08/22/16 0559 08/23/16 0545  NA 135 136  K 3.3* 4.0  CL 92* 95*  CO2 33* 34*  GLUCOSE 77 88  BUN 12 6  CREATININE 0.31* <0.30*  CALCIUM 7.9* 8.4*  MG 2.3  --    Liver Function Tests:  Recent Labs  08/22/16 0559  AST 15  ALT 14  ALKPHOS 150*  BILITOT 0.3  PROT 4.7*  ALBUMIN 1.7*   No results for input(s): LIPASE, AMYLASE in the last 72 hours. No results for input(s): AMMONIA in the last 72 hours. CBC:  Recent Labs  08/21/16 1540 08/22/16 0559  08/23/16 0545  WBC 43.6* 39.0* 27.1*  NEUTROABS 38.7* 34.5*  --   HGB 10.0* 8.7* 8.4*  HCT 31.7* 27.4* 26.6*  MCV 90.3 89.8 90.5  PLT 684* 642* 670*   Cardiac Enzymes: No results for input(s): CKTOTAL, CKMB, CKMBINDEX, TROPONINI in the last 72 hours. BNP: No results for input(s): PROBNP in the last 72 hours. D-Dimer:  Recent Labs  08/21/16 1540  DDIMER 0.83*   CBG:  Recent Labs  08/21/16 1601  GLUCAP 121*   Hemoglobin A1C: No results for input(s): HGBA1C in the last 72 hours. Fasting Lipid Panel: No results for input(s): CHOL, HDL, LDLCALC, TRIG, CHOLHDL, LDLDIRECT in the last 72 hours. Thyroid Function Tests: No results for input(s): TSH, T4TOTAL, FREET4, T3FREE, THYROIDAB in the last 72 hours. Anemia Panel: No results for input(s): VITAMINB12, FOLATE, FERRITIN, TIBC, IRON, RETICCTPCT in the last 72 hours. Coagulation: No results for input(s): LABPROT, INR in the last 72 hours. Urine Drug Screen: Drugs of Abuse  No results found for: LABOPIA, COCAINSCRNUR, LABBENZ, AMPHETMU, THCU, LABBARB  Alcohol Level: No results for input(s): ETH in the last 72 hours. Urinalysis: No results for input(s): COLORURINE, LABSPEC, PHURINE, GLUCOSEU, HGBUR, BILIRUBINUR, KETONESUR, PROTEINUR, UROBILINOGEN, NITRITE, LEUKOCYTESUR in the last 72 hours.  Invalid input(s): APPERANCEUR Misc. Labs:   ABGS: No results for input(s): PHART, PO2ART, TCO2, HCO3 in the last 72 hours.  Invalid input(s): PCO2   MICROBIOLOGY: Recent Results (from the past 240 hour(s))  Blood culture (routine x 2)     Status: None (Preliminary result)   Collection Time: 08/21/16  5:19 PM  Result Value Ref Range Status   Specimen Description BLOOD LEFT ARM  Final   Special Requests   Final    BOTTLES DRAWN AEROBIC ONLY Blood Culture results may not be optimal due to an inadequate volume of blood received in culture bottles   Culture NO GROWTH < 24 HOURS  Final   Report Status PENDING  Incomplete  Blood  culture (routine x 2)     Status: None (Preliminary result)   Collection Time: 08/21/16  5:19 PM  Result Value Ref Range Status   Specimen Description LEFT ANTECUBITAL  Final   Special Requests   Final    BOTTLES DRAWN AEROBIC ONLY Blood Culture results may not be optimal due to an inadequate volume of blood received in culture bottles   Culture NO GROWTH < 24 HOURS  Final   Report Status PENDING  Incomplete  MRSA PCR Screening     Status: None   Collection Time: 08/22/16  1:40 AM  Result Value Ref Range Status   MRSA by PCR NEGATIVE NEGATIVE Final    Comment:        The GeneXpert MRSA Assay (FDA approved for NASAL specimens only), is one component of a comprehensive MRSA colonization surveillance program. It is not intended to diagnose MRSA infection nor to  guide or monitor treatment for MRSA infections.     Studies/Results: Dg Chest 2 View  Result Date: 08/21/2016 CLINICAL DATA:  Cough. EXAM: CHEST  2 VIEW COMPARISON:  April 20, 2016 FINDINGS: Right middle lobe infiltrate, new in the interval. No other interval changes or acute abnormalities. IMPRESSION: 1. Right middle lobe pneumonia.  Recommend follow-up to resolution. 2. Mild anterior wedging of a lower thoracic vertebral body in similar in the interval given difference in positioning. Electronically Signed   By: Dorise Bullion III M.D   On: 08/21/2016 16:56   Ct Chest W Contrast  Result Date: 08/22/2016 CLINICAL DATA:  Pneumonia.  Significantly this leukocytosis (43) EXAM: CT CHEST WITH CONTRAST TECHNIQUE: Multidetector CT imaging of the chest was performed during intravenous contrast administration. CONTRAST:  84mL ISOVUE-300 IOPAMIDOL (ISOVUE-300) INJECTION 61% COMPARISON:  09/30/2008 FINDINGS: Cardiovascular: Normal heart size. Trace pericardial fluid that is low-density. No acute vascular finding. Atherosclerotic calcification. Mediastinum/Nodes: Mild prominence of hilar lymph nodes, considered reactive in this setting.  Lungs/Pleura: There are patchy areas of airspace disease seen in the right upper lobe (low-density lobulated abnormality seen anteriorly and more consolidative hypoenhancing consolidations seen medially). There is extensive consolidation in the right middle lobe with an internal cavitation containing fluid level. There is no enhancing capsule to the cavity; size is at least 2 cm. Small nodular opacity in the medial left upper lobe. 9 mm solid nodule in the posterior right upper lobe which does not appear similar to the inflammatory/infectious opacities. Centrilobular emphysema and diffuse airway thickening. Trace right pleural effusion which appears layering. Upper Abdomen: No acute finding. There may be steatosis of the liver. Musculoskeletal: Anterior left third and fourth rib fractures with subacute to chronic appearance. Multiple healing right rib fractures. Chronic appearing T12 superior endplate deformity. IMPRESSION: 1. Right middle lobe cavitary pneumonia with fluid level. The cavity measures at least 2 cm. 2. There are other areas of pneumonia with necrotic/cavitary features, consider septic embolic process. 3. Small layering right pleural effusion. 4. 9 mm right upper lobe nodule that does not appear infectious/inflammatory. Recommend chest CT follow-up in 3 months. 5. Healing bilateral rib fractures. 6.  Emphysema (ICD10-J43.9). Electronically Signed   By: Monte Fantasia M.D.   On: 08/22/2016 16:27   US Venous Img Lower Bilateral  Result Date: 08/22/2016 CLINICAL DATA:  71 year old female with bilateral lower extremity edema, shortness of breath, abnormal D-dimer. EXAM: BILATERAL LOWER EXTREMITY VENOUS DOPPLER ULTRASOUND TECHNIQUE: Gray-scale sonography with graded compression, as well as color Doppler and duplex ultrasound were performed to evaluate the lower extremity deep venous systems from the level of the common femoral vein and including the common femoral, femoral, profunda femoral, popliteal  and calf veins including the posterior tibial, peroneal and gastrocnemius veins when visible. The superficial great saphenous vein was also interrogated. Spectral Doppler was utilized to evaluate flow at rest and with distal augmentation maneuvers in the common femoral, femoral and popliteal veins. COMPARISON:  None. FINDINGS: RIGHT LOWER EXTREMITY Common Femoral Vein: No evidence of thrombus. Normal compressibility, respiratory phasicity and response to augmentation. Saphenofemoral Junction: No evidence of thrombus. Normal compressibility and flow on color Doppler imaging. Profunda Femoral Vein: No evidence of thrombus. Normal compressibility and flow on color Doppler imaging. Femoral Vein: No evidence of thrombus. Normal compressibility, respiratory phasicity and response to augmentation. Popliteal Vein: No evidence of thrombus. Normal compressibility, respiratory phasicity and response to augmentation. Calf Veins: No evidence of thrombus. Normal compressibility and flow on color Doppler imaging. Superficial Great Saphenous Vein:  No evidence of thrombus. Normal compressibility and flow on color Doppler imaging. Venous Reflux:  None. Other Findings:  None. LEFT LOWER EXTREMITY Common Femoral Vein: No evidence of thrombus. Normal compressibility, respiratory phasicity and response to augmentation. Saphenofemoral Junction: No evidence of thrombus. Normal compressibility and flow on color Doppler imaging. Profunda Femoral Vein: No evidence of thrombus. Normal compressibility and flow on color Doppler imaging. Femoral Vein: No evidence of thrombus. Normal compressibility, respiratory phasicity and response to augmentation. Popliteal Vein: No evidence of thrombus. Normal compressibility, respiratory phasicity and response to augmentation. Calf Veins: No evidence of thrombus. Normal compressibility and flow on color Doppler imaging. Superficial Great Saphenous Vein: No evidence of thrombus. Normal compressibility and  flow on color Doppler imaging. Venous Reflux:  None. Other Findings:  None. IMPRESSION: No evidence of DVT within either lower extremity. Electronically Signed   By: Genevie Ann M.D.   On: 08/22/2016 08:58    Medications:  Prior to Admission:  Prescriptions Prior to Admission  Medication Sig Dispense Refill Last Dose  . acetaminophen (TYLENOL) 325 MG tablet Take 650 mg by mouth every 6 (six) hours as needed for moderate pain.   08/20/2016 at Unknown time  . Amino Acids-Protein Hydrolys (FEEDING SUPPLEMENT, PRO-STAT 64,) LIQD Take 30 mLs by mouth 2 (two) times daily.   08/21/2016 at Unknown time  . amLODipine (NORVASC) 5 MG tablet Take 5 mg by mouth daily.   08/21/2016 at Unknown time  . aspirin EC 325 MG tablet Take 325 mg by mouth daily.   08/21/2016 at Unknown time  . Cholecalciferol (VITAMIN D-3) 1000 units CAPS Take 1 tablet by mouth daily.   08/21/2016 at Unknown time  . demeclocycline (DECLOMYCIN) 150 MG tablet Take 150 mg by mouth 2 (two) times daily.   08/21/2016 at Unknown time  . docusate sodium (COLACE) 100 MG capsule Take 100 mg by mouth 2 (two) times daily.   08/21/2016 at Unknown time  . ferrous sulfate 300 (60 Fe) MG/5ML syrup Take 300 mg by mouth daily.   08/21/2016 at Unknown time  . fluticasone furoate-vilanterol (BREO ELLIPTA) 200-25 MCG/INH AEPB Inhale 1 puff into the lungs daily.   08/21/2016 at Unknown time  . furosemide (LASIX) 40 MG tablet Take 40 mg by mouth daily.    08/21/2016 at Unknown time  . guaiFENesin-dextromethorphan (ROBITUSSIN DM) 100-10 MG/5ML syrup Take 10 mLs by mouth every 4 (four) hours as needed for cough.   08/20/2016 at Unknown time  . insulin aspart (NOVOLOG) 100 UNIT/ML injection Inject 0-10 Units into the skin 2 (two) times daily. Sliding Scale:: 60-200= 0 units 201-250= 2 units 251-300= 4 units 301-350= 6 units 351-400= 8 units 401-450=10units   08/21/2016 at Unknown time  . ipratropium-albuterol (DUONEB) 0.5-2.5 (3) MG/3ML SOLN Take 3 mLs by nebulization  every 6 (six) hours.   08/21/2016 at Unknown time  . mirtazapine (REMERON) 15 MG tablet Take 15 mg by mouth at bedtime.   08/20/2016 at Unknown time  . omeprazole (PRILOSEC) 20 MG capsule Take 20 mg by mouth daily.   08/21/2016 at Unknown time  . ondansetron (ZOFRAN) 4 MG tablet Take 4 mg by mouth every 8 (eight) hours as needed for nausea or vomiting.   Past Week at Unknown time  . OXYGEN Inhale 3 L into the lungs daily. During day and evening shifts   08/21/2016 at Unknown time  . potassium chloride (KLOR-CON) 20 MEQ packet Take 20 mEq by mouth daily.   08/21/2016 at Unknown time  . predniSONE (  DELTASONE) 10 MG tablet Take 10 mg by mouth daily with breakfast.   08/21/2016 at Unknown time  . sodium chloride (OCEAN) 0.65 % SOLN nasal spray Place 1 spray into both nostrils 3 (three) times daily.   08/21/2016 at Unknown time  . vitamin C (ASCORBIC ACID) 500 MG tablet Take 500 mg by mouth daily.   08/21/2016 at Unknown time   Scheduled: . enoxaparin (LOVENOX) injection  40 mg Subcutaneous Q24H  . fluticasone furoate-vilanterol  1 puff Inhalation Daily  . guaiFENesin  1,200 mg Oral BID  . mirtazapine  15 mg Oral QHS  . pantoprazole  40 mg Oral Daily  . predniSONE  40 mg Oral Q breakfast   Continuous: . meropenem (MERREM) IV 1 g (08/23/16 0200)  . vancomycin 500 mg (08/23/16 0545)   FEO:[id number], ipratropium-albuterol  Assesment:She was admitted with healthcare associated pneumonia. On CT this looks like there are cavities in the right middle lobe and there are also some other areas of pneumonia with what may be early cavitation in the upper lobe. She has a significant history of GERD and this would be consistent with aspiration. She has acute on chronic hypoxic and hypercapnic respiratory failure. The pneumonia would also be consistent with septic emboli and echocardiogram has been requested. She is on appropriate antibiotics. The most likely organisms would be mouth flora from aspiration, staph  and this is occasionally seen with Legionella although I think that's less likely. Principal Problem:   HCAP (healthcare-associated pneumonia) Active Problems:   COPD (chronic obstructive pulmonary disease) ([facility])   Acute-on-chronic respiratory failure ([facility])   Reflux esophagitis   IDA (iron deficiency anemia)   Acute hypokalemia   Thrombocytosis ([facility])   Leukocytosis   Positive D dimer   Pneumonia of right middle lobe due to infectious organism [facility])    Plan: Check Legionella urinary antigen. Continue with meropenem and vancomycin. Continue other treatments. Her white blood count has come down some since she was started on meropenem so I think that's at least a little bit reassuring. Her albumin level is very low so I think she has significant malnutrition in addition to the other problems. Her hemoglobin level has dropped and she does have a history of GI bleeding in the past. Discussed with Dr. Adair Patter.    LOS: 2 days   Heydy Montilla L 08/23/2016, 8:37 AM

## 2016-08-23 NOTE — Progress Notes (Signed)
*  PRELIMINARY RESULTS* Echocardiogram 2D Echocardiogram has been performed.  Leah Dalton 08/23/2016, 11:56 AM

## 2016-08-24 ENCOUNTER — Encounter (HOSPITAL_COMMUNITY): Payer: Self-pay | Admitting: Primary Care

## 2016-08-24 DIAGNOSIS — Z515 Encounter for palliative care: Secondary | ICD-10-CM

## 2016-08-24 DIAGNOSIS — Z7189 Other specified counseling: Secondary | ICD-10-CM

## 2016-08-24 DIAGNOSIS — E8809 Other disorders of plasma-protein metabolism, not elsewhere classified: Secondary | ICD-10-CM

## 2016-08-24 DIAGNOSIS — J449 Chronic obstructive pulmonary disease, unspecified: Secondary | ICD-10-CM

## 2016-08-24 DIAGNOSIS — E43 Unspecified severe protein-calorie malnutrition: Secondary | ICD-10-CM

## 2016-08-24 LAB — VANCOMYCIN, TROUGH: Vancomycin Tr: 10 ug/mL — ABNORMAL LOW (ref 15–20)

## 2016-08-24 LAB — BASIC METABOLIC PANEL
Anion gap: 5 (ref 5–15)
BUN: 5 mg/dL — ABNORMAL LOW (ref 6–20)
CALCIUM: 8.4 mg/dL — AB (ref 8.9–10.3)
CO2: 35 mmol/L — AB (ref 22–32)
Chloride: 94 mmol/L — ABNORMAL LOW (ref 101–111)
GLUCOSE: 79 mg/dL (ref 65–99)
Potassium: 3.9 mmol/L (ref 3.5–5.1)
Sodium: 134 mmol/L — ABNORMAL LOW (ref 135–145)

## 2016-08-24 MED ORDER — VANCOMYCIN HCL IN DEXTROSE 1-5 GM/200ML-% IV SOLN
1000.0000 mg | Freq: Two times a day (BID) | INTRAVENOUS | Status: DC
  Administered 2016-08-24 – 2016-08-26 (×5): 1000 mg via INTRAVENOUS
  Filled 2016-08-24 (×5): qty 200

## 2016-08-24 NOTE — Progress Notes (Signed)
Pharmacy Antibiotic Note  Leah Dalton is a 71 y.o. female admitted on 08/21/2016 with pneumonia.  Pharmacy has been consulted for vancomycin and meropenem dosing.  Trough level = 10  Plan: Meropenem 1gm IV q8hrs Increase Vancomycin to 1000 mg IV q12 hours (goal trough 15-20) F/u renal function, cultures and clinical course Deescalate ABX when appropriate  Height: 5\' 4"  (162.6 cm) Weight: 134 lb 14.4 oz (61.2 kg) IBW/kg (Calculated) : 54.7  Temp (24hrs), Avg:97.6 F (36.4 C), Min:97.4 F (36.3 C), Max:97.9 F (36.6 C)   Recent Labs Lab 08/21/16 1540 08/21/16 1729 08/22/16 0559 08/23/16 0545 08/24/16 0632  WBC 43.6*  --  39.0* 27.1*  --   CREATININE 0.42*  --  0.31* <0.30* <0.30*  LATICACIDVEN  --  1.63  --   --   --   VANCOTROUGH  --   --   --   --  10*    CrCl cannot be calculated (This lab value cannot be used to calculate CrCl because it is not a number: <0.30).    Allergies  Allergen Reactions  . Codeine Itching  . Daliresp [Roflumilast] Diarrhea  . Keflex [Cephalexin] Other (See Comments)    Gives pt yeast infection  . Penicillins Itching    Has patient had a PCN reaction causing immediate rash, facial/tongue/throat swelling, SOB or lightheadedness with hypotension: No Has patient had a PCN reaction causing severe rash involving mucus membranes or skin necrosis: Yes Has patient had a PCN reaction that required hospitalization No Has patient had a PCN reaction occurring within the last 10 years: No If all of the above answers are "NO", then may proceed with Cephalosporin use.    Thank you for allowing pharmacy to be a part of this patient's care.  Hart Robinsons A 08/24/2016 7:43 AM

## 2016-08-24 NOTE — Progress Notes (Signed)
PROGRESS NOTE    Leah Dalton  OHY:[id number] DOB: 1946/03/26 DOA: 08/21/2016 PCP: Crisoforo Oxford, MD    Brief Narrative:  Leah Fuller Halbrookis a 71 y.o.femalewith medical history significant for COPD with chronic hypercarbic respiratory failure, GERD with esophagitis, hereditary hemachromatosis, history of polycythemia requiring therapeutic phlebotomy remotely, now presenting from her nursing facility for evaluation of progressive cough and dyspnea with right-sided pleuritic pain- 1 wk.  ED Course:Upon arrival to the ED, patient is found to be afebrile, saturating adequately on her usual supplemental oxygen, tachypneic in the high 20s, tachycardic to 127, and with stable blood pressure. EKG features sinus tachycardia with rate 120 and PVC. Chest x-ray is notable for right middle lobe pneumonia. Chemistry panel reveals a potassium of 2.7 and bicarbonate of 39. CBC was notable for a leukocytosis to 43,600, and thrombocytosis to 684,000, and a stable normocytic anemia with hemoglobin of 10.0.  CT showing numerous, possibly septic, embolic.  Pulmonology consulted.  Echocardiogram ordered.   Assessment & Plan:   Principal Problem:   HCAP (healthcare-associated pneumonia) Active Problems:   COPD (chronic obstructive pulmonary disease) (HCC)   Acute-on-chronic respiratory failure (HCC)   Reflux esophagitis   IDA (iron deficiency anemia)   Acute hypokalemia   Thrombocytosis (HCC)   Leukocytosis   Positive D dimer   Pneumonia of right middle lobe due to infectious organism ([place])   HCAP - from nursing facility with 1 wk of progressive dyspnea, productive cough, and right pleuritic pain. Marked leukocytosis- 43 >>39. RML consolidation on CXR.  - CT chest with contrast- RML cavitary pneumonia, with fluid level, other areas of pneumonia with necrotic/cavitary features consider septic embolic process., Small right pleural effusion, recommend chest CT follow-up in 3 months. - echocardiogram  (08/23/16) showing Left ventricle: The cavity size was normal. Wall thickness was normal. Systolic function was normal. The estimated ejection fraction was in the range of 60% to 65%. Doppler parameters are consistent with abnormal left ventricular relaxation (grade 1 diastolic dysfunction). Pericardium, extracardiac: There is a mild pericardial effusion adjacent to the RV free wall and RA. Very prominent fat pad adjacent to RV. Discussed appearance with radiology based on recent CT scan which is also consistent with very prominent fat pad. Mixed hemodynamic findings. Some views suggest RA diastolic collapse, there is also evidence of MV inflow respiratory variation. IVC relatively normal, no evidence of RV diastolic collapse. Overall findings are not specific for tamponade. - will repeat limited echo tomorrow - Blood cultures 2: NG x 2 days - Strep pneumo negative - Pulmonology following - Continue vancomycin and meropenem - nebs, mucolytic   COPD with chronic hypercarbic respiratory failure, steroid-dependent - no wheezing, stable. On home O2 - Continue scheduled Breo,  - continue prednisone 40mg  daily   Hypokalemia - 3.9 today - resolved   Leukocytosis, thrombocytosis - likely reactive- 2/2 pneumonia, cavitary lesions on imaging. On chronic prednisone therapy. - peripheral blood smear ordered  GERD - continue PPI   Normocytic anemia- 10 >>8.  - hemoccult stool - repeat CBC in am  D-dimer elevation - D-dimer mildly elevated to 0.83 in Ed, most likely secondary to PNA, cavitary/necrotic lung lesions. - Chronic lower extremity swelling, stasis changes - [place] of b/l LE negative for DVT   DVT prophylaxis:sq Lovenox Code Status:Full  Family Communication:Discussed with patient Disposition Plan: pending improvement   Consultants:   Pulmonology  Procedures:   Echocardiogram  Antimicrobials:   Meropenem  Vancomycin    Subjective: Patient voices that she has  not  walked since January.  Voices that she feels like she has been going downhill for a while and voices she does not feel as though she will get any better.  Objective: Vitals:   08/23/16 1423 08/23/16 2100 08/24/16 0518 08/24/16 0737  BP: 127/69 126/81 123/73   Pulse: 97 88 92   Resp: 18 20 18    Temp: 97.9 F (36.6 C) 97.6 F (36.4 C) 97.4 F (36.3 C)   TempSrc: Oral Oral Oral   SpO2: 100% 100% 100% 100%  Weight:      Height:        Intake/Output Summary (Last 24 hours) at 08/24/16 8341 Last data filed at 08/24/16 0520  Gross per 24 hour  Intake              980 ml  Output                0 ml  Net              980 ml   Filed Weights   08/21/16 1517 08/21/16 1948  Weight: 60.8 kg (134 lb) 61.2 kg (134 lb 14.4 oz)    Examination:  General exam: Appears calm and comfortable  Respiratory system: rales in the left mid lung, significant rhonchi bilaterally, slightly increased work of breathing Cardiovascular system: S1 & S2 heard, RRR. No JVD, murmurs, rubs, gallops or clicks. 2+ bilateral edema to the knees Gastrointestinal system: Abdomen is nondistended, soft and nontender. No organomegaly or masses felt. Normal bowel sounds heard. Central nervous system: Alert and oriented. No focal neurological deficits. Extremities: Symmetric 4 x 5 power. Skin: No rashes, lesions or ulcers Psychiatry:  Mood & affect appropriate.     Data Reviewed: I have personally reviewed following labs and imaging studies  CBC:  Recent Labs Lab 08/21/16 1540 08/22/16 0559 08/23/16 0545  WBC 43.6* 39.0* 27.1*  NEUTROABS 38.7* 34.5*  --   HGB 10.0* 8.7* 8.4*  HCT 31.7* 27.4* 26.6*  MCV 90.3 89.8 90.5  PLT 684* 642* 962*   Basic Metabolic Panel:  Recent Labs Lab 08/21/16 1540 08/22/16 0559 08/23/16 0545 08/24/16 0632  NA 134* 135 136 134*  K 2.7* 3.3* 4.0 3.9  CL 84* 92* 95* 94*  CO2 39* 33* 34* 35*  GLUCOSE 113* 77 88 79  BUN 15 12 6  5*  CREATININE 0.42* 0.31* <0.30* <0.30*    CALCIUM 8.7* 7.9* 8.4* 8.4*  MG  --  2.3  --   --    GFR: CrCl cannot be calculated (This lab value cannot be used to calculate CrCl because it is not a number: <0.30). Liver Function Tests:  Recent Labs Lab 08/22/16 0559  AST 15  ALT 14  ALKPHOS 150*  BILITOT 0.3  PROT 4.7*  ALBUMIN 1.7*   No results for input(s): LIPASE, AMYLASE in the last 168 hours. No results for input(s): AMMONIA in the last 168 hours. Coagulation Profile: No results for input(s): INR, PROTIME in the last 168 hours. Cardiac Enzymes: No results for input(s): CKTOTAL, CKMB, CKMBINDEX, TROPONINI in the last 168 hours. BNP (last 3 results) No results for input(s): PROBNP in the last 8760 hours. HbA1C: No results for input(s): HGBA1C in the last 72 hours. CBG:  Recent Labs Lab 08/21/16 1601  GLUCAP 121*   Lipid Profile: No results for input(s): CHOL, HDL, LDLCALC, TRIG, CHOLHDL, LDLDIRECT in the last 72 hours. Thyroid Function Tests: No results for input(s): TSH, T4TOTAL, FREET4, T3FREE, THYROIDAB in the last 72 hours.  Anemia Panel: No results for input(s): VITAMINB12, FOLATE, FERRITIN, TIBC, IRON, RETICCTPCT in the last 72 hours. Sepsis Labs:  Recent Labs Lab 08/21/16 1729  LATICACIDVEN 1.63    Recent Results (from the past 240 hour(s))  Blood culture (routine x 2)     Status: None (Preliminary result)   Collection Time: 08/21/16  5:19 PM  Result Value Ref Range Status   Specimen Description BLOOD LEFT ARM  Final   Special Requests   Final    BOTTLES DRAWN AEROBIC ONLY Blood Culture results may not be optimal due to an inadequate volume of blood received in culture bottles   Culture NO GROWTH 3 DAYS  Final   Report Status PENDING  Incomplete  Blood culture (routine x 2)     Status: None (Preliminary result)   Collection Time: 08/21/16  5:19 PM  Result Value Ref Range Status   Specimen Description LEFT ANTECUBITAL  Final   Special Requests   Final    BOTTLES DRAWN AEROBIC ONLY Blood  Culture results may not be optimal due to an inadequate volume of blood received in culture bottles   Culture NO GROWTH 3 DAYS  Final   Report Status PENDING  Incomplete  MRSA PCR Screening     Status: None   Collection Time: 08/22/16  1:40 AM  Result Value Ref Range Status   MRSA by PCR NEGATIVE NEGATIVE Final    Comment:        The GeneXpert MRSA Assay (FDA approved for NASAL specimens only), is one component of a comprehensive MRSA colonization surveillance program. It is not intended to diagnose MRSA infection nor to guide or monitor treatment for MRSA infections.   Culture, sputum-assessment     Status: None   Collection Time: 08/23/16  6:25 PM  Result Value Ref Range Status   Specimen Description EXPECTORATED SPUTUM  Final   Special Requests NONE  Final   Sputum evaluation   Final    THIS SPECIMEN IS ACCEPTABLE FOR SPUTUM CULTURE Performed at [facility]    Report Status 08/23/2016 FINAL  Final         Radiology Studies: Ct Chest W Contrast  Result Date: 08/22/2016 CLINICAL DATA:  Pneumonia.  Significantly this leukocytosis (43) EXAM: CT CHEST WITH CONTRAST TECHNIQUE: Multidetector CT imaging of the chest was performed during intravenous contrast administration. CONTRAST:  11mL ISOVUE-300 IOPAMIDOL (ISOVUE-300) INJECTION 61% COMPARISON:  09/30/2008 FINDINGS: Cardiovascular: Normal heart size. Trace pericardial fluid that is low-density. No acute vascular finding. Atherosclerotic calcification. Mediastinum/Nodes: Mild prominence of hilar lymph nodes, considered reactive in this setting. Lungs/Pleura: There are patchy areas of airspace disease seen in the right upper lobe (low-density lobulated abnormality seen anteriorly and more consolidative hypoenhancing consolidations seen medially). There is extensive consolidation in the right middle lobe with an internal cavitation containing fluid level. There is no enhancing capsule to the cavity; size is at least 2 cm.  Small nodular opacity in the medial left upper lobe. 9 mm solid nodule in the posterior right upper lobe which does not appear similar to the inflammatory/infectious opacities. Centrilobular emphysema and diffuse airway thickening. Trace right pleural effusion which appears layering. Upper Abdomen: No acute finding. There may be steatosis of the liver. Musculoskeletal: Anterior left third and fourth rib fractures with subacute to chronic appearance. Multiple healing right rib fractures. Chronic appearing T12 superior endplate deformity. IMPRESSION: 1. Right middle lobe cavitary pneumonia with fluid level. The cavity measures at least 2 cm. 2. There are other areas of  pneumonia with necrotic/cavitary features, consider septic embolic process. 3. Small layering right pleural effusion. 4. 9 mm right upper lobe nodule that does not appear infectious/inflammatory. Recommend chest CT follow-up in 3 months. 5. Healing bilateral rib fractures. 6.  Emphysema (ICD10-J43.9). Electronically Signed   By: Monte Fantasia M.D.   On: 08/22/2016 16:27   US Venous Img Lower Bilateral  Result Date: 08/22/2016 CLINICAL DATA:  71 year old female with bilateral lower extremity edema, shortness of breath, abnormal D-dimer. EXAM: BILATERAL LOWER EXTREMITY VENOUS DOPPLER ULTRASOUND TECHNIQUE: Gray-scale sonography with graded compression, as well as color Doppler and duplex ultrasound were performed to evaluate the lower extremity deep venous systems from the level of the common femoral vein and including the common femoral, femoral, profunda femoral, popliteal and calf veins including the posterior tibial, peroneal and gastrocnemius veins when visible. The superficial great saphenous vein was also interrogated. Spectral Doppler was utilized to evaluate flow at rest and with distal augmentation maneuvers in the common femoral, femoral and popliteal veins. COMPARISON:  None. FINDINGS: RIGHT LOWER EXTREMITY Common Femoral Vein: No  evidence of thrombus. Normal compressibility, respiratory phasicity and response to augmentation. Saphenofemoral Junction: No evidence of thrombus. Normal compressibility and flow on color Doppler imaging. Profunda Femoral Vein: No evidence of thrombus. Normal compressibility and flow on color Doppler imaging. Femoral Vein: No evidence of thrombus. Normal compressibility, respiratory phasicity and response to augmentation. Popliteal Vein: No evidence of thrombus. Normal compressibility, respiratory phasicity and response to augmentation. Calf Veins: No evidence of thrombus. Normal compressibility and flow on color Doppler imaging. Superficial Great Saphenous Vein: No evidence of thrombus. Normal compressibility and flow on color Doppler imaging. Venous Reflux:  None. Other Findings:  None. LEFT LOWER EXTREMITY Common Femoral Vein: No evidence of thrombus. Normal compressibility, respiratory phasicity and response to augmentation. Saphenofemoral Junction: No evidence of thrombus. Normal compressibility and flow on color Doppler imaging. Profunda Femoral Vein: No evidence of thrombus. Normal compressibility and flow on color Doppler imaging. Femoral Vein: No evidence of thrombus. Normal compressibility, respiratory phasicity and response to augmentation. Popliteal Vein: No evidence of thrombus. Normal compressibility, respiratory phasicity and response to augmentation. Calf Veins: No evidence of thrombus. Normal compressibility and flow on color Doppler imaging. Superficial Great Saphenous Vein: No evidence of thrombus. Normal compressibility and flow on color Doppler imaging. Venous Reflux:  None. Other Findings:  None. IMPRESSION: No evidence of DVT within either lower extremity. Electronically Signed   By: Genevie Ann M.D.   On: 08/22/2016 08:58        Scheduled Meds: . enoxaparin (LOVENOX) injection  40 mg Subcutaneous Q24H  . fluticasone furoate-vilanterol  1 puff Inhalation Daily  . guaiFENesin  30 mL Oral  Q6H  . lidocaine  1 patch Transdermal Q24H  . mirtazapine  15 mg Oral QHS  . pantoprazole sodium  40 mg Per Tube Daily  . predniSONE  40 mg Oral Q breakfast   Continuous Infusions: . meropenem (MERREM) IV Stopped (08/24/16 0358)  . vancomycin       LOS: 3 days    Time spent: 30 minutes    Loretha Stapler, MD [facility] Pager [phone]  If 7PM-7AM, please contact night-coverage [email or web address] Password [email or web address] 08/24/2016, 8:22 AM

## 2016-08-24 NOTE — Progress Notes (Signed)
Subjective: She says she feels a little better. She is still coughing and coughing up a lot of sputum. Echocardiogram yesterday showed a pericardial effusion and repeat echocardiogram is planned for tomorrow.  Objective: Vital signs in last 24 hours: Temp:  [97.4 F (36.3 C)-97.9 F (36.6 C)] 97.4 F (36.3 C) (05/31 0518) Pulse Rate:  [88-109] 92 (05/31 0518) Resp:  [18-20] 18 (05/31 0518) BP: (123-137)/(67-81) 123/73 (05/31 0518) SpO2:  [100 %] 100 % (05/31 0737) Weight change:  Last BM Date: 08/22/16  Intake/Output from previous day: 05/30 0701 - 05/31 0700 In: 980 [P.O.:480; IV Piggyback:500] Out: -   PHYSICAL EXAM General appearance: alert, cooperative and mild distress Resp: rhonchi bilaterally Cardio: regular rate and rhythm, S1, S2 normal, no murmur, click, rub or gallop GI: soft, non-tender; bowel sounds normal; no masses,  no organomegaly Extremities: She has significant fairly diffuse edema. Mucous membranes are moist  Lab Results:  Results for orders placed or performed during the hospital encounter of 08/21/16 (from the past 48 hour(s))  CBC     Status: Abnormal   Collection Time: 08/23/16  5:45 AM  Result Value Ref Range   WBC 27.1 (H) 4.0 - 10.5 K/uL   RBC 2.94 (L) 3.87 - 5.11 MIL/uL   Hemoglobin 8.4 (L) 12.0 - 15.0 g/dL   HCT 26.6 (L) 36.0 - 46.0 %   MCV 90.5 78.0 - 100.0 fL   MCH 28.6 26.0 - 34.0 pg   MCHC 31.6 30.0 - 36.0 g/dL   RDW 17.5 (H) 11.5 - 15.5 %   Platelets 670 (H) 150 - 400 K/uL  Basic metabolic panel     Status: Abnormal   Collection Time: 08/23/16  5:45 AM  Result Value Ref Range   Sodium 136 135 - 145 mmol/L   Potassium 4.0 3.5 - 5.1 mmol/L    Comment: DELTA CHECK NOTED   Chloride 95 (L) 101 - 111 mmol/L   CO2 34 (H) 22 - 32 mmol/L   Glucose, Bld 88 65 - 99 mg/dL   BUN 6 6 - 20 mg/dL   Creatinine, Ser <0.30 (L) 0.44 - 1.00 mg/dL   Calcium 8.4 (L) 8.9 - 10.3 mg/dL   GFR calc non Af Amer NOT CALCULATED >60 mL/min   GFR calc Af Amer  NOT CALCULATED >60 mL/min    Comment: (NOTE) The eGFR has been calculated using the CKD EPI equation. This calculation has not been validated in all clinical situations. eGFR's persistently <60 mL/min signify possible Chronic Kidney Disease.    Anion gap 7 5 - 15  Culture, sputum-assessment     Status: None   Collection Time: 08/23/16  6:25 PM  Result Value Ref Range   Specimen Description EXPECTORATED SPUTUM    Special Requests NONE    Sputum evaluation      THIS SPECIMEN IS ACCEPTABLE FOR SPUTUM CULTURE Performed at [facility]    Report Status 08/23/2016 FINAL   Basic metabolic panel     Status: Abnormal   Collection Time: 08/24/16  6:32 AM  Result Value Ref Range   Sodium 134 (L) 135 - 145 mmol/L   Potassium 3.9 3.5 - 5.1 mmol/L   Chloride 94 (L) 101 - 111 mmol/L   CO2 35 (H) 22 - 32 mmol/L   Glucose, Bld 79 65 - 99 mg/dL   BUN 5 (L) 6 - 20 mg/dL   Creatinine, Ser <0.30 (L) 0.44 - 1.00 mg/dL   Calcium 8.4 (L) 8.9 - 10.3 mg/dL  GFR calc non Af Amer NOT CALCULATED >60 mL/min   GFR calc Af Amer NOT CALCULATED >60 mL/min    Comment: (NOTE) The eGFR has been calculated using the CKD EPI equation. This calculation has not been validated in all clinical situations. eGFR's persistently <60 mL/min signify possible Chronic Kidney Disease.    Anion gap 5 5 - 15  Vancomycin, trough     Status: Abnormal   Collection Time: 08/24/16  6:32 AM  Result Value Ref Range   Vancomycin Tr 10 (L) 15 - 20 ug/mL    ABGS No results for input(s): PHART, PO2ART, TCO2, HCO3 in the last 72 hours.  Invalid input(s): PCO2 CULTURES Recent Results (from the past 240 hour(s))  Blood culture (routine x 2)     Status: None (Preliminary result)   Collection Time: 08/21/16  5:19 PM  Result Value Ref Range Status   Specimen Description BLOOD LEFT ARM  Final   Special Requests   Final    BOTTLES DRAWN AEROBIC ONLY Blood Culture results may not be optimal due to an inadequate volume of blood  received in culture bottles   Culture NO GROWTH 3 DAYS  Final   Report Status PENDING  Incomplete  Blood culture (routine x 2)     Status: None (Preliminary result)   Collection Time: 08/21/16  5:19 PM  Result Value Ref Range Status   Specimen Description LEFT ANTECUBITAL  Final   Special Requests   Final    BOTTLES DRAWN AEROBIC ONLY Blood Culture results may not be optimal due to an inadequate volume of blood received in culture bottles   Culture NO GROWTH 3 DAYS  Final   Report Status PENDING  Incomplete  MRSA PCR Screening     Status: None   Collection Time: 08/22/16  1:40 AM  Result Value Ref Range Status   MRSA by PCR NEGATIVE NEGATIVE Final    Comment:        The GeneXpert MRSA Assay (FDA approved for NASAL specimens only), is one component of a comprehensive MRSA colonization surveillance program. It is not intended to diagnose MRSA infection nor to guide or monitor treatment for MRSA infections.   Culture, sputum-assessment     Status: None   Collection Time: 08/23/16  6:25 PM  Result Value Ref Range Status   Specimen Description EXPECTORATED SPUTUM  Final   Special Requests NONE  Final   Sputum evaluation   Final    THIS SPECIMEN IS ACCEPTABLE FOR SPUTUM CULTURE Performed at [facility]    Report Status 08/23/2016 FINAL  Final   Studies/Results: Ct Chest W Contrast  Result Date: 08/22/2016 CLINICAL DATA:  Pneumonia.  Significantly this leukocytosis (43) EXAM: CT CHEST WITH CONTRAST TECHNIQUE: Multidetector CT imaging of the chest was performed during intravenous contrast administration. CONTRAST:  70m ISOVUE-300 IOPAMIDOL (ISOVUE-300) INJECTION 61% COMPARISON:  09/30/2008 FINDINGS: Cardiovascular: Normal heart size. Trace pericardial fluid that is low-density. No acute vascular finding. Atherosclerotic calcification. Mediastinum/Nodes: Mild prominence of hilar lymph nodes, considered reactive in this setting. Lungs/Pleura: There are patchy areas of airspace  disease seen in the right upper lobe (low-density lobulated abnormality seen anteriorly and more consolidative hypoenhancing consolidations seen medially). There is extensive consolidation in the right middle lobe with an internal cavitation containing fluid level. There is no enhancing capsule to the cavity; size is at least 2 cm. Small nodular opacity in the medial left upper lobe. 9 mm solid nodule in the posterior right upper lobe which does not  appear similar to the inflammatory/infectious opacities. Centrilobular emphysema and diffuse airway thickening. Trace right pleural effusion which appears layering. Upper Abdomen: No acute finding. There may be steatosis of the liver. Musculoskeletal: Anterior left third and fourth rib fractures with subacute to chronic appearance. Multiple healing right rib fractures. Chronic appearing T12 superior endplate deformity. IMPRESSION: 1. Right middle lobe cavitary pneumonia with fluid level. The cavity measures at least 2 cm. 2. There are other areas of pneumonia with necrotic/cavitary features, consider septic embolic process. 3. Small layering right pleural effusion. 4. 9 mm right upper lobe nodule that does not appear infectious/inflammatory. Recommend chest CT follow-up in 3 months. 5. Healing bilateral rib fractures. 6.  Emphysema (ICD10-J43.9). Electronically Signed   By: Monte Fantasia M.D.   On: 08/22/2016 16:27   US Venous Img Lower Bilateral  Result Date: 08/22/2016 CLINICAL DATA:  71 year old female with bilateral lower extremity edema, shortness of breath, abnormal D-dimer. EXAM: BILATERAL LOWER EXTREMITY VENOUS DOPPLER ULTRASOUND TECHNIQUE: Gray-scale sonography with graded compression, as well as color Doppler and duplex ultrasound were performed to evaluate the lower extremity deep venous systems from the level of the common femoral vein and including the common femoral, femoral, profunda femoral, popliteal and calf veins including the posterior tibial,  peroneal and gastrocnemius veins when visible. The superficial great saphenous vein was also interrogated. Spectral Doppler was utilized to evaluate flow at rest and with distal augmentation maneuvers in the common femoral, femoral and popliteal veins. COMPARISON:  None. FINDINGS: RIGHT LOWER EXTREMITY Common Femoral Vein: No evidence of thrombus. Normal compressibility, respiratory phasicity and response to augmentation. Saphenofemoral Junction: No evidence of thrombus. Normal compressibility and flow on color Doppler imaging. Profunda Femoral Vein: No evidence of thrombus. Normal compressibility and flow on color Doppler imaging. Femoral Vein: No evidence of thrombus. Normal compressibility, respiratory phasicity and response to augmentation. Popliteal Vein: No evidence of thrombus. Normal compressibility, respiratory phasicity and response to augmentation. Calf Veins: No evidence of thrombus. Normal compressibility and flow on color Doppler imaging. Superficial Great Saphenous Vein: No evidence of thrombus. Normal compressibility and flow on color Doppler imaging. Venous Reflux:  None. Other Findings:  None. LEFT LOWER EXTREMITY Common Femoral Vein: No evidence of thrombus. Normal compressibility, respiratory phasicity and response to augmentation. Saphenofemoral Junction: No evidence of thrombus. Normal compressibility and flow on color Doppler imaging. Profunda Femoral Vein: No evidence of thrombus. Normal compressibility and flow on color Doppler imaging. Femoral Vein: No evidence of thrombus. Normal compressibility, respiratory phasicity and response to augmentation. Popliteal Vein: No evidence of thrombus. Normal compressibility, respiratory phasicity and response to augmentation. Calf Veins: No evidence of thrombus. Normal compressibility and flow on color Doppler imaging. Superficial Great Saphenous Vein: No evidence of thrombus. Normal compressibility and flow on color Doppler imaging. Venous Reflux:   None. Other Findings:  None. IMPRESSION: No evidence of DVT within either lower extremity. Electronically Signed   By: Genevie Ann M.D.   On: 08/22/2016 08:58    Medications:  Prior to Admission:  Prescriptions Prior to Admission  Medication Sig Dispense Refill Last Dose  . acetaminophen (TYLENOL) 325 MG tablet Take 650 mg by mouth every 6 (six) hours as needed for moderate pain.   08/20/2016 at Unknown time  . Amino Acids-Protein Hydrolys (FEEDING SUPPLEMENT, PRO-STAT 64,) LIQD Take 30 mLs by mouth 2 (two) times daily.   08/21/2016 at Unknown time  . amLODipine (NORVASC) 5 MG tablet Take 5 mg by mouth daily.   08/21/2016 at Unknown time  . aspirin  EC 325 MG tablet Take 325 mg by mouth daily.   08/21/2016 at Unknown time  . Cholecalciferol (VITAMIN D-3) 1000 units CAPS Take 1 tablet by mouth daily.   08/21/2016 at Unknown time  . demeclocycline (DECLOMYCIN) 150 MG tablet Take 150 mg by mouth 2 (two) times daily.   08/21/2016 at Unknown time  . docusate sodium (COLACE) 100 MG capsule Take 100 mg by mouth 2 (two) times daily.   08/21/2016 at Unknown time  . ferrous sulfate 300 (60 Fe) MG/5ML syrup Take 300 mg by mouth daily.   08/21/2016 at Unknown time  . fluticasone furoate-vilanterol (BREO ELLIPTA) 200-25 MCG/INH AEPB Inhale 1 puff into the lungs daily.   08/21/2016 at Unknown time  . furosemide (LASIX) 40 MG tablet Take 40 mg by mouth daily.    08/21/2016 at Unknown time  . guaiFENesin-dextromethorphan (ROBITUSSIN DM) 100-10 MG/5ML syrup Take 10 mLs by mouth every 4 (four) hours as needed for cough.   08/20/2016 at Unknown time  . insulin aspart (NOVOLOG) 100 UNIT/ML injection Inject 0-10 Units into the skin 2 (two) times daily. Sliding Scale:: 60-200= 0 units 201-250= 2 units 251-300= 4 units 301-350= 6 units 351-400= 8 units 401-450=10units   08/21/2016 at Unknown time  . ipratropium-albuterol (DUONEB) 0.5-2.5 (3) MG/3ML SOLN Take 3 mLs by nebulization every 6 (six) hours.   08/21/2016 at Unknown time   . mirtazapine (REMERON) 15 MG tablet Take 15 mg by mouth at bedtime.   08/20/2016 at Unknown time  . omeprazole (PRILOSEC) 20 MG capsule Take 20 mg by mouth daily.   08/21/2016 at Unknown time  . ondansetron (ZOFRAN) 4 MG tablet Take 4 mg by mouth every 8 (eight) hours as needed for nausea or vomiting.   Past Week at Unknown time  . OXYGEN Inhale 3 L into the lungs daily. During day and evening shifts   08/21/2016 at Unknown time  . potassium chloride (KLOR-CON) 20 MEQ packet Take 20 mEq by mouth daily.   08/21/2016 at Unknown time  . predniSONE (DELTASONE) 10 MG tablet Take 10 mg by mouth daily with breakfast.   08/21/2016 at Unknown time  . sodium chloride (OCEAN) 0.65 % SOLN nasal spray Place 1 spray into both nostrils 3 (three) times daily.   08/21/2016 at Unknown time  . vitamin C (ASCORBIC ACID) 500 MG tablet Take 500 mg by mouth daily.   08/21/2016 at Unknown time   Scheduled: . enoxaparin (LOVENOX) injection  40 mg Subcutaneous Q24H  . fluticasone furoate-vilanterol  1 puff Inhalation Daily  . guaiFENesin  30 mL Oral Q6H  . lidocaine  1 patch Transdermal Q24H  . mirtazapine  15 mg Oral QHS  . pantoprazole sodium  40 mg Per Tube Daily  . predniSONE  40 mg Oral Q breakfast   Continuous: . meropenem (MERREM) IV Stopped (08/24/16 0358)  . vancomycin 1,000 mg (08/24/16 0824)   VHQ:[id number], ipratropium-albuterol  Assesment:She was admitted with healthcare associated pneumonia presumably from aspiration. She has cavitary lesions. There was concern that she might have septic emboli so she had echocardiogram that shows that she has a pericardial effusion. No vegetations seen but apparently it was a fairly poor quality study. She has acute on chronic hypoxic respiratory failure. She has severe COPD at baseline. She has malnutrition and some of her edema I think is from that Principal Problem:   HCAP (healthcare-associated pneumonia) Active Problems:   COPD (chronic obstructive pulmonary  disease) ([place])   Acute-on-chronic respiratory failure ([place])  Reflux esophagitis   IDA (iron deficiency anemia)   Acute hypokalemia   Thrombocytosis ([facility])   Leukocytosis   Positive D dimer   Pneumonia of right middle lobe due to infectious organism [facility])    Plan: Continue current treatments    LOS: 3 days   Jakeem Grape L 08/24/2016, 8:34 AM

## 2016-08-24 NOTE — Consult Note (Signed)
Consultation Note Date: 08/24/2016   Patient Name: Leah Dalton  DOB: 12/10/1945  MRN: [id number]  Age / Sex: 71 y.o., female  PCP: Crisoforo Oxford, MD Referring Physician: Eber Jones, MD  Reason for Consultation: Establishing goals of care and Psychosocial/spiritual support  HPI/Patient Profile: 71 y.o. 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   Clinical Assessment and Goals of Care: Leah Dalton is resting quietly in bed. She is sleeping soundly but wakes easily when I call her name and touch her. There is no family present at bedside at this time. Leah Dalton states that she has one child, Leah Dalton who is a Quarry manager for [employer] since [place]. She shares that she moved to [place] to live with her cousin for 3 years, but returned to [place] to be near her family.  We talk about her chronic and acute health problems. Leah Dalton states that she has been hospitalized several times In the last year, first in November 2017. She shares that she became a resident of [name] December 20. She also shares that she has been unable to walk since January 2018.  We review her labs including continued increase white blood cells at 27, and low albumin at 1.7. I share my concern over these lab results. We talk about protein supplements, Leah Dalton is receiving protein supplements it [facility]. We talk about future goals. Leah Dalton states that her goal is to get better, get back to [facility], and hopefully they  can keep her stable.   We talk about "what ifs". She states that she would not want to be "cut on", but then states that she would "if it were needed". She shares her worried that she would not be able to get off of the ventilator if she needed surgery, and I agree that this is a very large risk, and may lead to trach. We also talk about a tube through the skin of her stomach to feed her. She states that she would not want this, but would take it if needed to prolong her life. We talk about code status, CPR. I discussed the concept of treat the treatable, but when her heart naturally stops, we let her go. [name] states that she believes when the heart naturally stops she should be let go. I ask if she has talked about this with Leah Dalton, and she states she has not. I share that it is important for him to know her wishes. I offered to have a meeting with him also.  Healthcare power of attorney HCPOA - [name] states her son, Leah Dalton is her legal healthcare power of attorney. He is her only child.   SUMMARY OF  RECOMMENDATIONS   continue to treat the treatable. The goal is to return to [facility] units he will, with the hopes of being stable. We talk about future choices such as CPR or intubation, PEG tube, surgeries. No decisions at this time.  Code Status/Advance Care Planning:  Full code - [name] states that she feels if her heart naturally stops then let her go, but she has not discussed this with her son Leah Dalton. I encouraged her to think about these choices including intubation, PEG tube, surgeries in the future. [name] states that she would not want surgery, she is fearful that she would never be able to get off the respirator, but she also states "if I need it", she also states the same of PEG tube.  Symptom Management:   per hospitalist and [name], no additional needs at this time.  Palliative Prophylaxis:   Aspiration, Oral Care and Turn Reposition  Additional  Recommendations (Limitations, Scope, Preferences):  Full Scope Treatment  Psycho-social/Spiritual:   Desire for further Chaplaincy support:no  Additional Recommendations: Caregiving  Support/Resources and Education on Hospice  Prognosis:   < 6 months, would not be surprising based on 3 hospitalizations in the last month, frailty, albumin of 1.7.  Discharge Planning: Return to [facility].     Primary Diagnoses: Present on Admission: . IDA (iron deficiency anemia) . COPD (chronic obstructive pulmonary disease) ([place]) . Acute-on-chronic respiratory failure ([place]) . Acute hypokalemia . Reflux esophagitis . Thrombocytosis ([place]) . Leukocytosis . Positive D dimer . HCAP (healthcare-associated pneumonia)   I have reviewed the medical record, interviewed the patient and family, and examined the patient. The following aspects are pertinent.  Past Medical History:  Diagnosis Date  . AVM (arteriovenous malformation) of colon   . COPD (chronic obstructive pulmonary disease) ([place])    O2 dependent, started 05/31/2012  . Diverticula of colon   . Esophagitis   . GERD (gastroesophageal reflux disease)   . 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 History of tobacco abuse   . Osteoporosis   . Polycythemia    Has had to have phlebotomies in the pas   Social History   Social History  . Marital status: Divorced    Spouse name: N/A  . Number of children: N/A  . Years of education: N/A   Social History Main Topics  . Smoking status: Former Smoker    Quit date: 2012  . Smokeless tobacco: Never Used  . Alcohol use No  . Drug use: No  . Sexual activity: Not Asked   Other Topics Concern  . None   Social History Narrative  . None   History reviewed. No pertinent family history. Scheduled Meds: . enoxaparin (LOVENOX) injection  40 mg Subcutaneous Q24H  . fluticasone furoate-vilanterol  1 puff Inhalation Daily  . guaiFENesin  30 mL Oral Q6H  .  lidocaine  1 patch Transdermal Q24H  . mirtazapine  15 mg Oral QHS  . pantoprazole sodium  40 mg Per Tube Daily  . predniSONE  40 mg Oral Q breakfast   Continuous Infusions: . meropenem (MERREM) IV Stopped (08/24/16 1151)  . vancomycin Stopped (08/24/16 0924)   PRN Meds:.diphenhydrAMINE, ipratropium-albuterol Medications Prior to Admission:  Prior to Admission medications   Medication Sig Start Date End Date Taking? Authorizing Provider  acetaminophen (TYLENOL) 325 MG tablet Take 650 mg by mouth every 6 (six) hours as needed for moderate pain.   Yes [provider]  Amino Acids-Protein Hydrolys (  FEEDING SUPPLEMENT, PRO-STAT 64,) LIQD Take 30 mLs by mouth 2 (two) times daily.   Yes [provider]  amLODipine (NORVASC) 5 MG tablet Take 5 mg by mouth daily.   Yes [provider]  aspirin EC 325 MG tablet Take 325 mg by mouth daily.   Yes [provider]  Cholecalciferol (VITAMIN D-3) 1000 units CAPS Take 1 tablet by mouth daily.   Yes [provider]  demeclocycline (DECLOMYCIN) 150 MG tablet Take 150 mg by mouth 2 (two) times daily.   Yes [provider]  docusate sodium (COLACE) 100 MG capsule Take 100 mg by mouth 2 (two) times daily.   Yes [provider]  ferrous sulfate 300 (60 Fe) MG/5ML syrup Take 300 mg by mouth daily.   Yes [provider]  fluticasone furoate-vilanterol (BREO ELLIPTA) 200-25 MCG/INH AEPB Inhale 1 puff into the lungs daily.   Yes [provider]  furosemide (LASIX) 40 MG tablet Take 40 mg by mouth daily.    Yes [provider]  guaiFENesin-dextromethorphan (ROBITUSSIN DM) 100-10 MG/5ML syrup Take 10 mLs by mouth every 4 (four) hours as needed for cough.   Yes [provider]  insulin aspart (NOVOLOG) 100 UNIT/ML injection Inject 0-10 Units into the skin 2 (two) times daily. Sliding Scale:: 60-200= 0 units 201-250= 2 units 251-300= 4 units 301-350= 6 units 351-400= 8  units 401-450=10units   Yes [provider]  ipratropium-albuterol (DUONEB) 0.5-2.5 (3) MG/3ML SOLN Take 3 mLs by nebulization every 6 (six) hours.   Yes [provider]  mirtazapine (REMERON) 15 MG tablet Take 15 mg by mouth at bedtime.   Yes [provider]  omeprazole (PRILOSEC) 20 MG capsule Take 20 mg by mouth daily.   Yes [provider]  ondansetron (ZOFRAN) 4 MG tablet Take 4 mg by mouth every 8 (eight) hours as needed for nausea or vomiting.   Yes [provider]  OXYGEN Inhale 3 L into the lungs daily. During day and evening shifts   Yes [provider]  potassium chloride (KLOR-CON) 20 MEQ packet Take 20 mEq by mouth daily. 03/15/16  Yes Sinda Du, MD  predniSONE (DELTASONE) 10 MG tablet Take 10 mg by mouth daily with breakfast.   Yes [provider]  sodium chloride (OCEAN) 0.65 % SOLN nasal spray Place 1 spray into both nostrils 3 (three) times daily.   Yes [provider]  vitamin C (ASCORBIC ACID) 500 MG tablet Take 500 mg by mouth daily.   Yes [provider]   Allergies  Allergen Reactions  . Codeine Itching  . Daliresp [Roflumilast] Diarrhea  . Keflex [Cephalexin] Other (See Comments)    Gives pt yeast infection  . Penicillins Itching    Has patient had a PCN reaction causing immediate rash, facial/tongue/throat swelling, SOB or lightheadedness with hypotension: No Has patient had a PCN reaction causing severe rash involving mucus membranes or skin necrosis: Yes Has patient had a PCN reaction that required hospitalization No Has patient had a PCN reaction occurring within the last 10 years: No If all of the above answers are "NO", then may proceed with Cephalosporin use.    Review of Systems  Unable to perform ROS: Acuity of condition    Physical Exam  Constitutional: She is oriented to person, place, and time. No distress.  Frail and thin, chronically ill appearing  HENT:  Head:  Normocephalic and atraumatic.  Cardiovascular: Normal rate and regular rhythm.   Pulmonary/Chest: Effort normal.  No respiratory distress.  Productive cough  Abdominal: Soft. She exhibits no distension.  Musculoskeletal: She exhibits edema.  Neurological: She is alert and oriented to person, place, and time.  Skin: Skin is warm and dry.  Nursing note and vitals reviewed.   Vital Signs: BP 126/72 (BP Location: Left Arm)   Pulse 94   Temp 98.7 F (37.1 C) (Oral)   Resp 16   Ht 5\' 4"  (1.626 m)   Wt 61.2 kg (134 lb 14.4 oz)   SpO2 100%   BMI 23.16 kg/m  Pain Assessment: No/denies pain   Pain Score: 0-No pain   SpO2: SpO2: 100 % O2 Device:SpO2: 100 % O2 Flow Rate: .O2 Flow Rate (L/min): 3 L/min  IO: Intake/output summary:  Intake/Output Summary (Last 24 hours) at 08/24/16 1503 Last data filed at 08/24/16 1300  Gross per 24 hour  Intake              980 ml  Output                2 ml  Net              978 ml    LBM: Last BM Date: 08/22/16 Baseline Weight: Weight: 60.8 kg (134 lb) Most recent weight: Weight: 61.2 kg (134 lb 14.4 oz)     Palliative Assessment/Data:   Flowsheet Rows     Most Recent Value  Intake Tab  Referral Department  Hospitalist  Unit at Time of Referral  Med/Surg Unit  Palliative Care Primary Diagnosis  Pulmonary  Date Notified  08/24/16  Palliative Care Type  New Palliative care  Reason for referral  Clarify Goals of Care, Advance Care Planning  Date of Admission  08/21/16  Date first seen by Palliative Care  08/24/16  # of days Palliative referral response time  0 Day(s)  # of days IP prior to Palliative referral  3  Clinical Assessment  Palliative Performance Scale Score  30%  Pain Max last 24 hours  Not able to report  Pain Min Last 24 hours  Not able to report  Dyspnea Max Last 24 Hours  Not able to report  Dyspnea Min Last 24 hours  Not able to report  Psychosocial & Spiritual Assessment  Palliative Care Outcomes  Patient/Family  meeting held?  Yes  Who was at the meeting?  Patient only today  Palliative Care Outcomes  Provided advance care planning, Provided psychosocial or spiritual support, Clarified goals of care      Time In: 1400 Time Out: 1450 Time Total: 50 minutes Greater than 50%  of this time was spent counseling and coordinating care related to the above assessment and plan.  Signed by: Drue Novel, NP   Please contact Palliative Medicine Team phone at [phone] for questions and concerns.  For individual provider: See Shea Evans

## 2016-08-25 ENCOUNTER — Inpatient Hospital Stay (HOSPITAL_BASED_OUTPATIENT_CLINIC_OR_DEPARTMENT_OTHER): Payer: Medicare Other

## 2016-08-25 DIAGNOSIS — Z7189 Other specified counseling: Secondary | ICD-10-CM

## 2016-08-25 DIAGNOSIS — I350 Nonrheumatic aortic (valve) stenosis: Secondary | ICD-10-CM | POA: Diagnosis not present

## 2016-08-25 LAB — BASIC METABOLIC PANEL
Anion gap: 5 (ref 5–15)
BUN: <5 mg/dL — ABNORMAL LOW (ref 6–20)
CHLORIDE: 94 mmol/L — AB (ref 101–111)
CO2: 36 mmol/L — AB (ref 22–32)
Calcium: 8.4 mg/dL — ABNORMAL LOW (ref 8.9–10.3)
Creatinine, Ser: <0.3 mg/dL — ABNORMAL LOW (ref 0.44–1.00)
GLUCOSE: 83 mg/dL (ref 65–99)
POTASSIUM: 3.5 mmol/L (ref 3.5–5.1)
SODIUM: 135 mmol/L (ref 135–145)

## 2016-08-25 LAB — CBC
HEMATOCRIT: 26.2 % — AB (ref 36.0–46.0)
Hemoglobin: 8.2 g/dL — ABNORMAL LOW (ref 12.0–15.0)
MCH: 28 pg (ref 26.0–34.0)
MCHC: 31.3 g/dL (ref 30.0–36.0)
MCV: 89.4 fL (ref 78.0–100.0)
Platelets: 739 10*3/uL — ABNORMAL HIGH (ref 150–400)
RBC: 2.93 MIL/uL — ABNORMAL LOW (ref 3.87–5.11)
RDW: 17.1 % — AB (ref 11.5–15.5)
WBC: 12.8 10*3/uL — AB (ref 4.0–10.5)

## 2016-08-25 LAB — ECHOCARDIOGRAM LIMITED
Height: 64 in
Weight: 2158.4 oz

## 2016-08-25 MED ORDER — TRAZODONE HCL 50 MG PO TABS
50.0000 mg | ORAL_TABLET | Freq: Every evening | ORAL | Status: DC | PRN
  Administered 2016-08-25 – 2016-08-27 (×3): 50 mg via ORAL
  Filled 2016-08-25 (×3): qty 1

## 2016-08-25 NOTE — Progress Notes (Signed)
Daily Progress Note   Patient Name: Leah Dalton       Date: 08/25/2016 DOB: [birth year]  Age: 71 y.o. MRN#: [id number] Attending Physician: Leah Jones, MD Primary Care Physician: Leah Oxford, MD Admit Date: 08/21/2016  Reason for Consultation/Follow-up: Establishing goals of care and Psychosocial/spiritual support  Subjective: Leah Dalton is resting quietly in bed. She greets me making and keeping eye contact as I enter. I share the news that her white blood cells are much improved today and she smiles. I share that I anticipate she'll will return to [facility] sometime this weekend.  We talk about her respiratory status, and I encourage her to turn, cough and deep breathe when she returns to her facility. Leah Dalton states that she thinks she will continue with physical therapy, but is somewhat worried about her ability at this point. I encouraged her to do what she can.  I share that I see and Leah Dalton note that she has elected to, "treat the treatable, but allow a natural death. She agrees. I share that I will make this note in her chart and fill out a document for her. I ask if she has discussed this with her son, she states she has not at this point. Tech and I get her sitting up in bed for lunch, no questions or concerns at this time.  Length of Stay: 4  Current Medications: Scheduled Meds:  . enoxaparin (LOVENOX) injection  40 mg Subcutaneous Q24H  . fluticasone furoate-vilanterol  1 puff Inhalation Daily  . guaiFENesin  30 mL Oral Q6H  . lidocaine  1 patch Transdermal Q24H  . mirtazapine  15 mg Oral QHS  . pantoprazole sodium  40 mg Per Tube Daily  . predniSONE  40 mg Oral Q breakfast    Continuous Infusions: . meropenem (MERREM) IV Stopped  (08/25/16 1057)  . vancomycin Stopped (08/25/16 1127)    PRN Meds: diphenhydrAMINE, ipratropium-albuterol, traZODone  Physical Exam  Constitutional: No distress.  Frail and thin, chronically ill appearing  HENT:  Head: Normocephalic and atraumatic.  Cardiovascular: Normal rate and regular rhythm.   Pulmonary/Chest: Effort normal. No respiratory distress.  Productive cough  Abdominal: Soft. There is no tenderness.  Musculoskeletal: She exhibits edema.  Muscle wasting  Neurological: She is alert.  Skin: Skin is warm and  dry.  Areas of bruising on right lower arm  Nursing note and vitals reviewed.           Vital Signs: BP (!) 151/86 (BP Location: Left Arm)   Pulse 94   Temp 98.5 F (36.9 C) (Oral)   Resp 20   Ht 5\' 4"  (1.626 m)   Wt 61.2 kg (134 lb 14.4 oz)   SpO2 96%   BMI 23.16 kg/m  SpO2: SpO2: 96 % O2 Device: O2 Device: Nasal Cannula O2 Flow Rate: O2 Flow Rate (L/min): 3 L/min  Intake/output summary:  Intake/Output Summary (Last 24 hours) at 08/25/16 1309 Last data filed at 08/25/16 0030  Gross per 24 hour  Intake              540 ml  Output              204 ml  Net              336 ml   LBM: Last BM Date: 08/22/16 Baseline Weight: Weight: 60.8 kg (134 lb) Most recent weight: Weight: 61.2 kg (134 lb 14.4 oz)       Palliative Assessment/Data:    Flowsheet Rows     Most Recent Value  Intake Tab  Referral Department  Hospitalist  Unit at Time of Referral  Med/Surg Unit  Palliative Care Primary Diagnosis  Pulmonary  Date Notified  08/24/16  Palliative Care Type  New Palliative care  Reason for referral  Clarify Goals of Care, Advance Care Planning  Date of Admission  08/21/16  Date first seen by Palliative Care  08/24/16  # of days Palliative referral response time  0 Day(s)  # of days IP prior to Palliative referral  3  Clinical Assessment  Palliative Performance Scale Score  30%  Pain Max last 24 hours  Not able to report  Pain Min Last 24 hours   Not able to report  Dyspnea Max Last 24 Hours  Not able to report  Dyspnea Min Last 24 hours  Not able to report  Psychosocial & Spiritual Assessment  Palliative Care Outcomes  Patient/Family meeting held?  Yes  Who was at the meeting?  Patient only today  Palliative Care Outcomes  Provided advance care planning, Provided psychosocial or spiritual support, Clarified goals of care      Patient Active Problem List   Diagnosis Date Noted  . Palliative care encounter   . Goals of care, counseling/discussion   . DNR (do not resuscitate) discussion   . Hypoalbuminemia   . Thrombocytosis ([place]) 08/21/2016  . Leukocytosis 08/21/2016  . Positive D dimer 08/21/2016  . HCAP (healthcare-associated pneumonia) 08/21/2016  . Pneumonia of right middle lobe due to infectious organism ([place])   . Acute hypokalemia 02/20/2016  . 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 AVM (arteriovenous malformation) of colon 04/10/2014  . IDA (iron deficiency anemia) 04/10/2014  . External hemorrhoids 02/24/2014  . Reflux esophagitis   . COPD (chronic obstructive pulmonary disease) ([place]) 06/07/2012  . Acute-on-chronic respiratory failure ([place]) 06/07/2012  . Polycythemia 06/07/2012  . Hyponatremia 06/07/2012    Palliative Care Assessment & Plan   Patient Profile: 71 y.o. 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  Assessment: End stage COPD; Ms. Dalton is now oxygen dependent, she is had several exacerbations of her COPD this year. She is next out with medical treatment. She is elected no intubation if she were to decline. Frailty, failure to thrive; Leah Dalton  is taking nutritional supplements at her residential SNF, but her albumin remains low at 1.7 this hospitalization. She has been bedbound since January 2018, but continues with physical therapy treatments. She states that she wonders what shall be able to do after this hospitalization.  Recommendations/Plan:  continue to treat the treatable but no extraordinary measures such as CPR or intubation. Return to [facility] with the goal of stabilization.  Goals of Care and Additional Recommendations:  Limitations on Scope of Treatment: Treat the treatable but no CPR or intubation  Code Status:    Code Status Orders        Start     Ordered   08/21/16 1825  Full code  Continuous     08/21/16 1826    Code Status History    Date Active Date Inactive Code Status Order ID Comments User Context   03/09/2016  5:22 PM 03/15/2016  4:41 PM Full Code [id number]  Sinda Du, MD Inpatient   02/20/2016  3:02 PM 02/26/2016  2:31 PM Full Code [id number]  Orvan Falconer, MD Inpatient   02/22/2014 11:14 AM 02/24/2014  3:13 PM Full Code [id number]  Donne Hazel, MD ED   06/07/2012  4:34 PM 06/10/2012  7:58 PM Full Code [id number]  Rexene Alberts, MD ED       Prognosis:   < 6 months, or less would not be surprising based on 3 hospitalizations in the last month, frailty, albumin of 1.7.  Discharge Planning:  Return to [facility] with the goal of stabilization  Care plan was discussed with nursing staff, case manager, social worker, and Dr. Adair Patter on next rounds.  Thank you for allowing the Palliative Medicine Team to assist in the care of this patient.   Time In: 1105 Time Out: 1130 Total Time 25 minutes  Prolonged Time Billed  no       Greater than 50%  of this time was spent counseling and coordinating care related to the above assessment and plan.  Drue Novel, NP  Please contact Palliative Medicine Team phone at [phone] for questions and concerns.

## 2016-08-25 NOTE — Clinical Social Work Note (Signed)
Clinical Social Work Assessment  Patient Details  Name: ROWENA MOILANEN MRN: [id number] Date of Birth: 09-25-1945  Date of referral:  08/25/16               Reason for consult:  Discharge Planning                Permission sought to share information with:  Case Manager, Facility Sport and exercise psychologist, Family Supports Permission granted to share information::  Yes, Verbal Permission Granted  Name::        Agency::  [place]  Relationship::  [place] POA  Contact Information:     Housing/Transportation Living arrangements for the past 2 months:  [place] of Information:  Patient, Medical Team, Tourist information centre manager, Adult Children, Facility Patient Interpreter Needed:  None Criminal Activity/Legal Involvement Pertinent to Current Situation/Hospitalization:  No - Comment as needed Significant Relationships:  Adult Children, Other Family Members, Community Support Lives with:  Facility Resident Do you feel safe going back to the place where you live?  Yes Need for family participation in patient care:  Yes (Comment)  Care giving concerns:  Patient is a current resident at [facility].  Plan is to return at discharge with no care giving concerns. Palliative met with patient and still addressing long term goals of care with patient and POA. Patient  shares that she became a resident of [name] December 20. She also shares that she has been unable to walk since January 2018.    Social Worker assessment / plan:  Plan remains for patient to return to [facility] at discharge. Confirmed with facility and no barriers. Will update FL2 and assist with discharge back to nursing facility once medically stable. She was admitted for PNA and receiving current treatments.  Employment status:  Retired Forensic scientist:  Medicare PT Recommendations:  Not assessed at this time Information / Referral to community  resources:     Patient/Family's Response to care:  Understanding, involved in care  Patient/Family's Understanding of and Emotional Response to Diagnosis, Current Treatment, and Prognosis:  Patient able to verbalize and understand current treatments along with being active in [place] with palliative regarding her plans.  Emotional Assessment Appearance:  Appears stated age Attitude/Demeanor/Rapport:    Affect (typically observed):  Accepting, Adaptable Orientation:  Oriented to Self, Oriented to Place Alcohol / Substance use:  Not Applicable Psych involvement (Current and /or in the community):  No (Comment)  Discharge Needs  Concerns to be addressed:  No discharge needs identified Readmission within the last 30 days:  No Current discharge risk:  None Barriers to Discharge:  No Barriers Identified   [place], LCSW 08/25/2016, 9:11 AM

## 2016-08-25 NOTE — Progress Notes (Signed)
PROGRESS NOTE    Leah Dalton  CNO:[id number] DOB: April 28, 1945 DOA: 08/21/2016 PCP: Crisoforo Oxford, MD    Brief Narrative:  Leah Dalton a 71 y.o.femalewith medical history significant for COPD with chronic hypercarbic respiratory failure, GERD with esophagitis, hereditary hemachromatosis, history of polycythemia requiring therapeutic phlebotomy remotely, now presenting from her nursing facility for evaluation of progressive cough and dyspnea with right-sided pleuritic pain- 1 wk.  ED Course:Upon arrival to the ED, patient is found to be afebrile, saturating adequately on her usual supplemental oxygen, tachypneic in the high 20s, tachycardic to 127, and with stable blood pressure. EKG features sinus tachycardia with rate 120 and PVC. Chest x-ray is notable for right middle lobe pneumonia. Chemistry panel reveals a potassium of 2.7 and bicarbonate of 39. CBC was notable for a leukocytosis to 43,600, and thrombocytosis to 684,000, and a stable normocytic anemia with hemoglobin of 10.0.  CT showing numerous, possibly septic, embolic.  Pulmonology consulted.  Echocardiogram ordered and showed pericardial effusion with prominent fat pad; repeat echo in two days was recommended.  Palliative care was consulted given patient's COPD and functional decline over the past 6 months.   Assessment & Plan:   Principal Problem:   HCAP (healthcare-associated pneumonia) Active Problems:   COPD (chronic obstructive pulmonary disease) (HCC)   Acute-on-chronic respiratory failure (HCC)   Reflux esophagitis   IDA (iron deficiency anemia)   Acute hypokalemia   Thrombocytosis (HCC)   Leukocytosis   Positive D dimer   Pneumonia of right middle lobe due to infectious organism [facility])   Palliative care encounter   Goals of care, counseling/discussion   DNR (do not resuscitate) discussion   Hypoalbuminemia   HCAP   - CT chest with contrast- RML cavitary pneumonia, with fluid level, other areas of pneumonia  with necrotic/cavitary features consider septic embolic process., Small right pleural effusion, recommend chest CT follow-up in 3 months. - echocardiogram (08/23/16) showing Left ventricle: The cavity size was normal. Wall thickness was normal. Systolic function was normal. The estimated ejection fraction was in the range of 60% to 65%. Doppler parameters are consistent with abnormal left ventricular relaxation (grade 1 diastolic dysfunction). Pericardium, extracardiac: There is a mild pericardial effusion adjacent to the RV free wall and RA. Very prominent fat pad adjacent to RV. Discussed appearance with radiology based on recent CT scan which is also consistent with very prominent fat pad. Mixed hemodynamic findings. Some views suggest RA diastolic collapse, there is also evidence of MV inflow respiratory variation. IVC relatively normal, no evidence of RV diastolic collapse. Overall findings are not specific for tamponade. - repeat echo today showing trivial pericardial effusion and prominent pericardial fat pad - will repeat limited echo tomorrow - Blood cultures 2: NG x 4 days - Pulmonology following - Continue vancomycin and meropenem - will need to transition to PO antibiotics if possible  - nebs, mucolytic   COPD with chronic hypercarbic respiratory failure, steroid-dependent - no wheezing, stable. On home O2 - Continue scheduled Breo,  - continue prednisone 40mg  daily   Hypokalemia - 3.5 today - resolved   Leukocytosis, thrombocytosis - likely reactive- 2/2 pneumonia, cavitary lesions on imaging. On chronic prednisone therapy. - peripheral blood smear pending   GERD - continue PPI   Normocytic anemia- 10 >>8.  - hemoccult stool - H/H of 8.2/26.2 today (stable from previously)  D-dimer elevation - D-dimer mildly elevated to 0.83 in Ed, most likely secondary to PNA, cavitary/necrotic lung lesions - [place] of b/l LE negative for  DVT   DVT prophylaxis:sq Lovenox Code  Status:DNR Family Communication:Discussed with patient Disposition Plan:  Likely discharge within 48 hours back to SNF   Consultants:   Pulmonology  Palliative Care  Procedures:   Echocardiogram  Antimicrobials:   Meropenem  Vancomycin    Subjective: Patient in decent spirits today.  She is hopeful to discharge over the weekend back to [facility].  She has decided to choose DNR/ DNI after thinking about it yesterday.  She says she has had a significant cough for the past hour and can't stop coughing.  Objective: Vitals:   08/24/16 1411 08/24/16 2314 08/25/16 0548 08/25/16 0827  BP: 126/72 (!) 151/77 (!) 151/86   Pulse: 94 94 94   Resp: 16 18 20    Temp: 98.7 F (37.1 C) 98.1 F (36.7 C) 98.5 F (36.9 C)   TempSrc: Oral Oral Oral   SpO2: 100% 100% 100% 96%  Weight:      Height:        Intake/Output Summary (Last 24 hours) at 08/25/16 1314 Last data filed at 08/25/16 0030  Gross per 24 hour  Intake              540 ml  Output              204 ml  Net              336 ml   Filed Weights   08/21/16 1517 08/21/16 1948  Weight: 60.8 kg (134 lb) 61.2 kg (134 lb 14.4 oz)    Examination:  General exam: Appears calm and comfortable  Respiratory system: rales in the left and right mid lung, significant rhonchi bilaterally, coughing throughout exam Cardiovascular system: S1 & S2 heard, RRR. No JVD, murmurs, rubs, gallops or clicks. 3+ bilateral edema to the knees Gastrointestinal system: Abdomen is nondistended, soft and nontender. No organomegaly or masses felt. Normal bowel sounds heard. Central nervous system: Alert and oriented. No focal neurological deficits. Extremities: Symmetric 4 x 5 power. Skin: No rashes, lesions or ulcers, scattered ecchymoses Psychiatry:  Mood & affect appropriate   Data Reviewed: I have personally reviewed following labs and imaging studies  CBC:  Recent Labs Lab 08/21/16 1540 08/22/16 0559 08/23/16 0545 08/25/16 0409    WBC 43.6* 39.0* 27.1* 12.8*  NEUTROABS 38.7* 34.5*  --   --   HGB 10.0* 8.7* 8.4* 8.2*  HCT 31.7* 27.4* 26.6* 26.2*  MCV 90.3 89.8 90.5 89.4  PLT 684* 642* 670* 767*   Basic Metabolic Panel:  Recent Labs Lab 08/21/16 1540 08/22/16 0559 08/23/16 0545 08/24/16 0632 08/25/16 0409  NA 134* 135 136 134* 135  K 2.7* 3.3* 4.0 3.9 3.5  CL 84* 92* 95* 94* 94*  CO2 39* 33* 34* 35* 36*  GLUCOSE 113* 77 88 79 83  BUN 15 12 6  5* <5*  CREATININE 0.42* 0.31* <0.30* <0.30* <0.30*  CALCIUM 8.7* 7.9* 8.4* 8.4* 8.4*  MG  --  2.3  --   --   --    GFR: CrCl cannot be calculated (This lab value cannot be used to calculate CrCl because it is not a number: <0.30). Liver Function Tests:  Recent Labs Lab 08/22/16 0559  AST 15  ALT 14  ALKPHOS 150*  BILITOT 0.3  PROT 4.7*  ALBUMIN 1.7*   No results for input(s): LIPASE, AMYLASE in the last 168 hours. No results for input(s): AMMONIA in the last 168 hours. Coagulation Profile: No results for input(s): INR, PROTIME in  the last 168 hours. Cardiac Enzymes: No results for input(s): CKTOTAL, CKMB, CKMBINDEX, TROPONINI in the last 168 hours. BNP (last 3 results) No results for input(s): PROBNP in the last 8760 hours. HbA1C: No results for input(s): HGBA1C in the last 72 hours. CBG:  Recent Labs Lab 08/21/16 1601  GLUCAP 121*   Lipid Profile: No results for input(s): CHOL, HDL, LDLCALC, TRIG, CHOLHDL, LDLDIRECT in the last 72 hours. Thyroid Function Tests: No results for input(s): TSH, T4TOTAL, FREET4, T3FREE, THYROIDAB in the last 72 hours. Anemia Panel: No results for input(s): VITAMINB12, FOLATE, FERRITIN, TIBC, IRON, RETICCTPCT in the last 72 hours. Sepsis Labs:  Recent Labs Lab 08/21/16 1729  LATICACIDVEN 1.63    Recent Results (from the past 240 hour(s))  Blood culture (routine x 2)     Status: None (Preliminary result)   Collection Time: 08/21/16  5:19 PM  Result Value Ref Range Status   Specimen Description BLOOD  LEFT ARM  Final   Special Requests   Final    BOTTLES DRAWN AEROBIC ONLY Blood Culture results may not be optimal due to an inadequate volume of blood received in culture bottles   Culture NO GROWTH 4 DAYS  Final   Report Status PENDING  Incomplete  Blood culture (routine x 2)     Status: None (Preliminary result)   Collection Time: 08/21/16  5:19 PM  Result Value Ref Range Status   Specimen Description LEFT ANTECUBITAL  Final   Special Requests   Final    BOTTLES DRAWN AEROBIC ONLY Blood Culture results may not be optimal due to an inadequate volume of blood received in culture bottles   Culture NO GROWTH 4 DAYS  Final   Report Status PENDING  Incomplete  MRSA PCR Screening     Status: None   Collection Time: 08/22/16  1:40 AM  Result Value Ref Range Status   MRSA by PCR NEGATIVE NEGATIVE Final    Comment:        The GeneXpert MRSA Assay (FDA approved for NASAL specimens only), is one component of a comprehensive MRSA colonization surveillance program. It is not intended to diagnose MRSA infection nor to guide or monitor treatment for MRSA infections.   Culture, sputum-assessment     Status: None   Collection Time: 08/23/16  6:25 PM  Result Value Ref Range Status   Specimen Description EXPECTORATED SPUTUM  Final   Special Requests NONE  Final   Sputum evaluation   Final    THIS SPECIMEN IS ACCEPTABLE FOR SPUTUM CULTURE Performed at [facility]    Report Status 08/23/2016 FINAL  Final  Culture, respiratory (NON-Expectorated)     Status: None (Preliminary result)   Collection Time: 08/23/16  6:25 PM  Result Value Ref Range Status   Specimen Description EXPECTORATED SPUTUM  Final   Special Requests NONE Reflexed from [id number]  Final   Gram Stain   Final    FEW WBC PRESENT, PREDOMINANTLY PMN RARE YEAST Performed at [facility], [place] [address] [place], [place] [address]    Culture FEW GRAM NEGATIVE RODS  Final   Report Status PENDING  Incomplete          Radiology Studies: No results found.      Scheduled Meds: . enoxaparin (LOVENOX) injection  40 mg Subcutaneous Q24H  . fluticasone furoate-vilanterol  1 puff Inhalation Daily  . guaiFENesin  30 mL Oral Q6H  . lidocaine  1 patch Transdermal Q24H  . mirtazapine  15 mg  Oral QHS  . pantoprazole sodium  40 mg Per Tube Daily  . predniSONE  40 mg Oral Q breakfast   Continuous Infusions: . meropenem (MERREM) IV Stopped (08/25/16 1057)  . vancomycin Stopped (08/25/16 1127)     LOS: 4 days    Time spent: 30 minutes    Loretha Stapler, MD [facility] Pager [phone]  If 7PM-7AM, please contact night-coverage [email or web address] Password [email or web address] 08/25/2016, 1:14 PM

## 2016-08-25 NOTE — Progress Notes (Signed)
*  PRELIMINARY RESULTS* Echocardiogram 2D Echocardiogram has been performed.  Leavy Cella 08/25/2016, 10:29 AM

## 2016-08-25 NOTE — Progress Notes (Signed)
Bilateral heel are boggy and red.  The skin is intact.  Bilateral foam dressings were placed.

## 2016-08-25 NOTE — Progress Notes (Signed)
Subjective: She says she feels somewhat better. She has no new complaints. She still coughing up some sputum. She said she didn't sleep very well. This morning she tells me that she would not want CPR or life support. She does want to continue with her other treatments that she is on now.  Objective: Vital signs in last 24 hours: Temp:  [98.1 F (36.7 C)-98.7 F (37.1 C)] 98.5 F (36.9 C) (06/01 0548) Pulse Rate:  [94] 94 (06/01 0548) Resp:  [16-20] 20 (06/01 0548) BP: (126-151)/(72-86) 151/86 (06/01 0548) SpO2:  [96 %-100 %] 96 % (06/01 0827) Weight change:  Last BM Date: 08/22/16  Intake/Output from previous day: 05/31 0701 - 06/01 0700 In: 1120 [P.O.:720; IV Piggyback:400] Out: 208 [Urine:204; Stool:4]  PHYSICAL EXAM General appearance: alert, cooperative and mild distress Resp: rhonchi bilaterally Cardio: regular rate and rhythm, S1, S2 normal, no murmur, click, rub or gallop GI: soft, non-tender; bowel sounds normal; no masses,  no organomegaly Extremities: She still has some edema bilaterally in her legs feet and arms Skin warm and dry  Lab Results:  Results for orders placed or performed during the hospital encounter of 08/21/16 (from the past 48 hour(s))  Culture, sputum-assessment     Status: None   Collection Time: 08/23/16  6:25 PM  Result Value Ref Range   Specimen Description EXPECTORATED SPUTUM    Special Requests NONE    Sputum evaluation      THIS SPECIMEN IS ACCEPTABLE FOR SPUTUM CULTURE Performed at [facility]    Report Status 08/23/2016 FINAL   Culture, respiratory (NON-Expectorated)     Status: None (Preliminary result)   Collection Time: 08/23/16  6:25 PM  Result Value Ref Range   Specimen Description EXPECTORATED SPUTUM    Special Requests NONE Reflexed from [id number]    Gram Stain      FEW WBC PRESENT, PREDOMINANTLY PMN RARE YEAST Performed at [facility], [address] [place], [place] [address]    Culture PENDING    Report  Status PENDING   Basic metabolic panel     Status: Abnormal   Collection Time: 08/24/16  6:32 AM  Result Value Ref Range   Sodium 134 (L) 135 - 145 mmol/L   Potassium 3.9 3.5 - 5.1 mmol/L   Chloride 94 (L) 101 - 111 mmol/L   CO2 35 (H) 22 - 32 mmol/L   Glucose, Bld 79 65 - 99 mg/dL   BUN 5 (L) 6 - 20 mg/dL   Creatinine, Ser <0.30 (L) 0.44 - 1.00 mg/dL   Calcium 8.4 (L) 8.9 - 10.3 mg/dL   GFR calc non Af Amer NOT CALCULATED >60 mL/min   GFR calc Af Amer NOT CALCULATED >60 mL/min    Comment: (NOTE) The eGFR has been calculated using the CKD EPI equation. This calculation has not been validated in all clinical situations. eGFR's persistently <60 mL/min signify possible Chronic Kidney Disease.    Anion gap 5 5 - 15  Vancomycin, trough     Status: Abnormal   Collection Time: 08/24/16  6:32 AM  Result Value Ref Range   Vancomycin Tr 10 (L) 15 - 20 ug/mL  Basic metabolic panel     Status: Abnormal   Collection Time: 08/25/16  4:09 AM  Result Value Ref Range   Sodium 135 135 - 145 mmol/L   Potassium 3.5 3.5 - 5.1 mmol/L   Chloride 94 (L) 101 - 111 mmol/L   CO2 36 (H) 22 - 32 mmol/L  Glucose, Bld 83 65 - 99 mg/dL   BUN <5 (L) 6 - 20 mg/dL   Creatinine, Ser <0.30 (L) 0.44 - 1.00 mg/dL   Calcium 8.4 (L) 8.9 - 10.3 mg/dL   GFR calc non Af Amer NOT CALCULATED >60 mL/min   GFR calc Af Amer NOT CALCULATED >60 mL/min    Comment: (NOTE) The eGFR has been calculated using the CKD EPI equation. This calculation has not been validated in all clinical situations. eGFR's persistently <60 mL/min signify possible Chronic Kidney Disease.    Anion gap 5 5 - 15  CBC     Status: Abnormal   Collection Time: 08/25/16  4:09 AM  Result Value Ref Range   WBC 12.8 (H) 4.0 - 10.5 K/uL   RBC 2.93 (L) 3.87 - 5.11 MIL/uL   Hemoglobin 8.2 (L) 12.0 - 15.0 g/dL   HCT 26.2 (L) 36.0 - 46.0 %   MCV 89.4 78.0 - 100.0 fL   MCH 28.0 26.0 - 34.0 pg   MCHC 31.3 30.0 - 36.0 g/dL   RDW 17.1 (H) 11.5 - 15.5 %    Platelets 739 (H) 150 - 400 K/uL    ABGS No results for input(s): PHART, PO2ART, TCO2, HCO3 in the last 72 hours.  Invalid input(s): PCO2 CULTURES Recent Results (from the past 240 hour(s))  Blood culture (routine x 2)     Status: None (Preliminary result)   Collection Time: 08/21/16  5:19 PM  Result Value Ref Range Status   Specimen Description BLOOD LEFT ARM  Final   Special Requests   Final    BOTTLES DRAWN AEROBIC ONLY Blood Culture results may not be optimal due to an inadequate volume of blood received in culture bottles   Culture NO GROWTH 4 DAYS  Final   Report Status PENDING  Incomplete  Blood culture (routine x 2)     Status: None (Preliminary result)   Collection Time: 08/21/16  5:19 PM  Result Value Ref Range Status   Specimen Description LEFT ANTECUBITAL  Final   Special Requests   Final    BOTTLES DRAWN AEROBIC ONLY Blood Culture results may not be optimal due to an inadequate volume of blood received in culture bottles   Culture NO GROWTH 4 DAYS  Final   Report Status PENDING  Incomplete  MRSA PCR Screening     Status: None   Collection Time: 08/22/16  1:40 AM  Result Value Ref Range Status   MRSA by PCR NEGATIVE NEGATIVE Final    Comment:        The GeneXpert MRSA Assay (FDA approved for NASAL specimens only), is one component of a comprehensive MRSA colonization surveillance program. It is not intended to diagnose MRSA infection nor to guide or monitor treatment for MRSA infections.   Culture, sputum-assessment     Status: None   Collection Time: 08/23/16  6:25 PM  Result Value Ref Range Status   Specimen Description EXPECTORATED SPUTUM  Final   Special Requests NONE  Final   Sputum evaluation   Final    THIS SPECIMEN IS ACCEPTABLE FOR SPUTUM CULTURE Performed at [facility]    Report Status 08/23/2016 FINAL  Final  Culture, respiratory (NON-Expectorated)     Status: None (Preliminary result)   Collection Time: 08/23/16  6:25 PM  Result  Value Ref Range Status   Specimen Description EXPECTORATED SPUTUM  Final   Special Requests NONE Reflexed from [id number]  Final   Gram Stain   Final  FEW WBC PRESENT, PREDOMINANTLY PMN RARE YEAST Performed at [facility], [place] [address], [place], [place] [address]    Culture PENDING  Incomplete   Report Status PENDING  Incomplete   Studies/Results: No results found.  Medications:  Prior to Admission:  Prescriptions Prior to Admission  Medication Sig Dispense Refill Last Dose  . acetaminophen (TYLENOL) 325 MG tablet Take 650 mg by mouth every 6 (six) hours as needed for moderate pain.   08/20/2016 at Unknown time  . Amino Acids-Protein Hydrolys (FEEDING SUPPLEMENT, PRO-STAT 64,) LIQD Take 30 mLs by mouth 2 (two) times daily.   08/21/2016 at Unknown time  . amLODipine (NORVASC) 5 MG tablet Take 5 mg by mouth daily.   08/21/2016 at Unknown time  . aspirin EC 325 MG tablet Take 325 mg by mouth daily.   08/21/2016 at Unknown time  . Cholecalciferol (VITAMIN D-3) 1000 units CAPS Take 1 tablet by mouth daily.   08/21/2016 at Unknown time  . demeclocycline (DECLOMYCIN) 150 MG tablet Take 150 mg by mouth 2 (two) times daily.   08/21/2016 at Unknown time  . docusate sodium (COLACE) 100 MG capsule Take 100 mg by mouth 2 (two) times daily.   08/21/2016 at Unknown time  . ferrous sulfate 300 (60 Fe) MG/5ML syrup Take 300 mg by mouth daily.   08/21/2016 at Unknown time  . fluticasone furoate-vilanterol (BREO ELLIPTA) 200-25 MCG/INH AEPB Inhale 1 puff into the lungs daily.   08/21/2016 at Unknown time  . furosemide (LASIX) 40 MG tablet Take 40 mg by mouth daily.    08/21/2016 at Unknown time  . guaiFENesin-dextromethorphan (ROBITUSSIN DM) 100-10 MG/5ML syrup Take 10 mLs by mouth every 4 (four) hours as needed for cough.   08/20/2016 at Unknown time  . insulin aspart (NOVOLOG) 100 UNIT/ML injection Inject 0-10 Units into the skin 2 (two) times daily. Sliding Scale:: 60-200= 0 units 201-250= 2  units 251-300= 4 units 301-350= 6 units 351-400= 8 units 401-450=10units   08/21/2016 at Unknown time  . ipratropium-albuterol (DUONEB) 0.5-2.5 (3) MG/3ML SOLN Take 3 mLs by nebulization every 6 (six) hours.   08/21/2016 at Unknown time  . mirtazapine (REMERON) 15 MG tablet Take 15 mg by mouth at bedtime.   08/20/2016 at Unknown time  . omeprazole (PRILOSEC) 20 MG capsule Take 20 mg by mouth daily.   08/21/2016 at Unknown time  . ondansetron (ZOFRAN) 4 MG tablet Take 4 mg by mouth every 8 (eight) hours as needed for nausea or vomiting.   Past Week at Unknown time  . OXYGEN Inhale 3 L into the lungs daily. During day and evening shifts   08/21/2016 at Unknown time  . potassium chloride (KLOR-CON) 20 MEQ packet Take 20 mEq by mouth daily.   08/21/2016 at Unknown time  . predniSONE (DELTASONE) 10 MG tablet Take 10 mg by mouth daily with breakfast.   08/21/2016 at Unknown time  . sodium chloride (OCEAN) 0.65 % SOLN nasal spray Place 1 spray into both nostrils 3 (three) times daily.   08/21/2016 at Unknown time  . vitamin C (ASCORBIC ACID) 500 MG tablet Take 500 mg by mouth daily.   08/21/2016 at Unknown time   Scheduled: . enoxaparin (LOVENOX) injection  40 mg Subcutaneous Q24H  . fluticasone furoate-vilanterol  1 puff Inhalation Daily  . guaiFENesin  30 mL Oral Q6H  . lidocaine  1 patch Transdermal Q24H  . mirtazapine  15 mg Oral QHS  . pantoprazole sodium  40 mg Per Tube  Daily  . predniSONE  40 mg Oral Q breakfast   Continuous: . meropenem (MERREM) IV Stopped (08/25/16 0310)  . vancomycin Stopped (08/24/16 2206)   WTU:[id number], ipratropium-albuterol, traZODone  Assesment:She has pneumonia which is cavitary and she is improving. Her white blood count has come down significantly after the change in her antibiotics. She is still coughing up sputum but sounds much better today. She has COPD exacerbation as well and that's getting better. She has acute on chronic hypoxic respiratory failure  which is stable. She has a pericardial effusion and that will be reevaluated today Principal Problem:   HCAP (healthcare-associated pneumonia) Active Problems:   COPD (chronic obstructive pulmonary disease) (HCC)   Acute-on-chronic respiratory failure (HCC)   Reflux esophagitis   IDA (iron deficiency anemia)   Acute hypokalemia   Thrombocytosis (HCC)   Leukocytosis   Positive D dimer   Pneumonia of right middle lobe due to infectious organism [facility])   Palliative care encounter   Goals of care, counseling/discussion   DNR (do not resuscitate) discussion   Hypoalbuminemia    Plan: Continue current treatments    LOS: 4 days   Dayshon Roback L 08/25/2016, 9:00 AM

## 2016-08-25 NOTE — NC FL2 (Signed)
[place] LEVEL OF CARE SCREENING TOOL     IDENTIFICATION  Patient Name: Leah Dalton Birthdate: Mar 24, 1946 Sex: female Admission Date (Current Location): 08/21/2016  [facility] and [place] Number:  [employer] and Address:  [facility] [address] [place]      Provider Number: [id number]  Attending Physician Name and Address:  Eber Jones, MD  Relative Name and Phone Number:       Current Level of Care: Hospital Recommended Level of Care: [place] Prior Approval Number:    Date Approved/Denied:   PASRR Number:    Discharge Plan: SNF    Current Diagnoses: Patient Active Problem List   Diagnosis Date Noted  . Palliative care encounter   . Goals of care, counseling/discussion   . DNR (do not resuscitate) discussion   . Hypoalbuminemia   . Thrombocytosis ([place]) 08/21/2016  . Leukocytosis 08/21/2016  . Positive D dimer 08/21/2016  . HCAP (healthcare-associated pneumonia) 08/21/2016  . Pneumonia of right middle lobe due to infectious organism ([place])   . Acute hypokalemia 02/20/2016  . 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 AVM (arteriovenous malformation) of colon 04/10/2014  . IDA (iron deficiency anemia) 04/10/2014  . External hemorrhoids 02/24/2014  . Reflux esophagitis   . COPD (chronic obstructive pulmonary disease) ([place]) 06/07/2012  . Acute-on-chronic respiratory failure ([place]) 06/07/2012  . Polycythemia 06/07/2012  . Hyponatremia 06/07/2012    Orientation RESPIRATION BLADDER Height & Weight     Self, Place  O2 (3L) Continent Weight: 134 lb 14.4 oz (61.2 kg) Height:  5\' 4"  (162.6 cm)  BEHAVIORAL SYMPTOMS/MOOD NEUROLOGICAL BOWEL NUTRITION STATUS      Continent Diet (see DC summary)  AMBULATORY STATUS COMMUNICATION OF NEEDS Skin   Extensive Assist Verbally Normal                       Personal Care Assistance Level of Assistance  Bathing, Feeding, Dressing Bathing Assistance:  Limited assistance Feeding assistance: Limited assistance Dressing Assistance: Limited assistance     Functional Limitations Info  Sight, Hearing, Speech Sight Info: Adequate Hearing Info: Adequate Speech Info: Adequate    SPECIAL CARE FACTORS FREQUENCY                       Contractures Contractures Info: Not present    Additional Factors Info  Code Status, Allergies Code Status Info: Full Code Allergies Info: Codeine, Daliresp Roflumilast, Keflex Cephalexin, Penicillins           Current Medications (08/25/2016):  This is the current hospital active medication list Current Facility-Administered Medications  Medication Dose Route Frequency Provider Last Rate Last Dose  . diphenhydrAMINE (BENADRYL) capsule 25 mg  25 mg Oral Q6H PRN Opyd, Ilene Qua, MD      . enoxaparin (LOVENOX) injection 40 mg  40 mg Subcutaneous Q24H Opyd, Ilene Qua, MD   40 mg at 08/24/16 1719  . fluticasone furoate-vilanterol (BREO ELLIPTA) 200-25 MCG/INH 1 puff  1 puff Inhalation Daily Opyd, Ilene Qua, MD   1 puff at 08/25/16 0826  . guaiFENesin (ROBITUSSIN) 100 MG/5ML solution 600 mg  30 mL Oral Q6H Eber Jones, MD   600 mg at 08/25/16 0601  . ipratropium-albuterol (DUONEB) 0.5-2.5 (3) MG/3ML nebulizer solution 3 mL  3 mL Nebulization Q4H PRN Opyd, Ilene Qua, MD      . lidocaine (LIDODERM) 5 % 1 patch  1 patch Transdermal Q24H Eber Jones, MD  1 patch at 08/24/16 0825  . meropenem (MERREM) 1 g in sodium chloride 0.9 % 100 mL IVPB  1 g Intravenous Q8H Emokpae, Ejiroghene E, MD   Stopped at 08/25/16 0310  . mirtazapine (REMERON) tablet 15 mg  15 mg Oral QHS Opyd, Ilene Qua, MD   15 mg at 08/24/16 2106  . pantoprazole sodium (PROTONIX) 40 mg/20 mL oral suspension 40 mg  40 mg Per Tube Daily Eber Jones, MD   40 mg at 08/24/16 1000  . predniSONE (DELTASONE) tablet 40 mg  40 mg Oral Q breakfast Emokpae, Ejiroghene E, MD   40 mg at 08/24/16 0824  . traZODone (DESYREL) tablet  50 mg  50 mg Oral QHS PRN Sinda Du, MD      . vancomycin (VANCOCIN) IVPB 1000 mg/200 mL premix  1,000 mg Intravenous Q12H Eber Jones, MD   Stopped at 08/24/16 2206     Discharge Medications: Please see discharge summary for a list of discharge medications.  Relevant Imaging Results:  Relevant Lab Results:   Additional Information SSN: [id number]  [place], LCSW

## 2016-08-26 LAB — CULTURE, RESPIRATORY W GRAM STAIN

## 2016-08-26 LAB — CULTURE, BLOOD (ROUTINE X 2)
CULTURE: NO GROWTH
CULTURE: NO GROWTH

## 2016-08-26 LAB — CULTURE, RESPIRATORY

## 2016-08-26 MED ORDER — DEXTROSE 5 % IV SOLN
2.0000 g | Freq: Three times a day (TID) | INTRAVENOUS | Status: DC
  Administered 2016-08-26 – 2016-08-28 (×7): 2 g via INTRAVENOUS
  Filled 2016-08-26 (×10): qty 2

## 2016-08-26 MED ORDER — ACETAMINOPHEN 325 MG PO TABS
650.0000 mg | ORAL_TABLET | Freq: Four times a day (QID) | ORAL | Status: DC | PRN
  Administered 2016-08-26 – 2016-08-28 (×5): 650 mg via ORAL
  Filled 2016-08-26 (×6): qty 2

## 2016-08-26 NOTE — Progress Notes (Signed)
Pharmacy Antibiotic Note  Leah Dalton is a 71 y.o. female admitted on 08/21/2016 with pneumonia.  Pharmacy has been consulted for Cefepime dosing. Patient is now afebrile, WBC improved.  Dr. Adair Patter talked with ID MD, there recommendation was for 2 weeks IV antipseudomonal antibiotic therapy. In addition, consider TEE with the results of the CT chest that showed RML cavitary PNA. Marland Kitchen   Plan: Cefepime 2gm IV q8hours F/U clinical progress, monitor V/S, labs  Height: 5\' 4"  (162.6 cm) Weight: 134 lb 14.4 oz (61.2 kg) IBW/kg (Calculated) : 54.7  Temp (24hrs), Avg:98 F (36.7 C), Min:97.7 F (36.5 C), Max:98.2 F (36.8 C)   Recent Labs Lab 08/21/16 1540 08/21/16 1729 08/22/16 0559 08/23/16 0545 08/24/16 0632 08/25/16 0409  WBC 43.6*  --  39.0* 27.1*  --  12.8*  CREATININE 0.42*  --  0.31* <0.30* <0.30* <0.30*  LATICACIDVEN  --  1.63  --   --   --   --   VANCOTROUGH  --   --   --   --  10*  --     Estimated CrCl is 60-65 mls/min  CrCl cannot be calculated (This lab value cannot be used to calculate CrCl because it is not a number: <0.30).    Allergies  Allergen Reactions  . Codeine Itching  . Daliresp [Roflumilast] Diarrhea  . Keflex [Cephalexin] Other (See Comments)    Gives pt yeast infection  . Penicillins Itching    Has patient had a PCN reaction causing immediate rash, facial/tongue/throat swelling, SOB or lightheadedness with hypotension: No Has patient had a PCN reaction causing severe rash involving mucus membranes or skin necrosis: Yes Has patient had a PCN reaction that required hospitalization No Has patient had a PCN reaction occurring within the last 10 years: No If all of the above answers are "NO", then may proceed with Cephalosporin use.     Antimicrobials this admission: Vancomycin 5/28>> 6/2 Merrem 5/29 >> 6/2  Dose adjustments this admission: N/A  Microbiology results: 5/28 BCx: ngtd 5/30 Sputum: Pseudomonas A.  Pan-sensitive 5/29 MRSA PCR:  negative  Thank you for allowing pharmacy to be a part of this patient's care.  Isac Sarna, BS [place], [place] Clinical Pharmacist Pager [phone] 08/26/2016 12:11 PM

## 2016-08-26 NOTE — Progress Notes (Signed)
PROGRESS NOTE    Leah ALBERT  EGB:[id number] DOB: 1945/04/01 DOA: 08/21/2016 PCP: Crisoforo Oxford, MD    Brief Narrative:  Akayla Brass Halbrookis a 71 y.o.femalewith medical history significant for COPD with chronic hypercarbic respiratory failure, GERD with esophagitis, hereditary hemachromatosis, history of polycythemia requiring therapeutic phlebotomy remotely, now presenting from her nursing facility for evaluation of progressive cough and dyspnea with right-sided pleuritic pain- 1 wk.  ED Course:Upon arrival to the ED, patient is found to be afebrile, saturating adequately on her usual supplemental oxygen, tachypneic in the high 20s, tachycardic to 127, and with stable blood pressure. EKG features sinus tachycardia with rate 120 and PVC. Chest x-ray is notable for right middle lobe pneumonia. Chemistry panel reveals a potassium of 2.7 and bicarbonate of 39. CBC was notable for a leukocytosis to 43,600, and thrombocytosis to 684,000, and a stable normocytic anemia with hemoglobin of 10.0.  CT showing numerous, possibly septic, embolic.  Pulmonology consulted.  Echocardiogram ordered and showed pericardial effusion with prominent fat pad; repeat echo in two days was recommended.  Palliative care was consulted given patient's COPD and functional decline over the past 6 months.   Assessment & Plan:   Principal Problem:   HCAP (healthcare-associated pneumonia) Active Problems:   COPD (chronic obstructive pulmonary disease) (HCC)   Acute-on-chronic respiratory failure (HCC)   Reflux esophagitis   IDA (iron deficiency anemia)   Acute hypokalemia   Thrombocytosis (HCC)   Leukocytosis   Positive D dimer   Pneumonia of right middle lobe due to infectious organism [facility])   Palliative care encounter   Goals of care, counseling/discussion   DNR (do not resuscitate) discussion   Hypoalbuminemia   HCAP   - CT chest with contrast- RML cavitary pneumonia, with fluid level, other areas of pneumonia  with necrotic/cavitary features consider septic embolic process., Small right pleural effusion, recommend chest CT follow-up in 3 months. - echocardiogram (08/23/16) showing Left ventricle: The cavity size was normal. Wall thickness was normal. Systolic function was normal. The estimated ejection fraction was in the range of 60% to 65%. Doppler parameters are consistent with abnormal left ventricular relaxation (grade 1 diastolic dysfunction). Pericardium, extracardiac: There is a mild pericardial effusion adjacent to the RV free wall and RA. Very prominent fat pad adjacent to RV. Discussed appearance with radiology based on recent CT scan which is also consistent with very prominent fat pad. Mixed hemodynamic findings. Some views suggest RA diastolic collapse, there is also evidence of MV inflow respiratory variation. IVC relatively normal, no evidence of RV diastolic collapse. Overall findings are not specific for tamponade. - repeat echo today showing trivial pericardial effusion and prominent pericardial fat pad - may need to consider TEE  - Blood cultures 2: NG x 4 days - Pulmonology following - transition to Cefepime given patients Pseudomonas in sputum - discussed on phone with infectious disease- will need at least 2 weeks of IV antibiotic therapy, perhaps even 4 weeks - will discuss need for TEE with [name] tomorrow - will need transition to oral antibiotics after IV antibiotic treatment course  COPD with chronic hypercarbic respiratory failure, steroid-dependent - no wheezing, stable. On home O2 - Continue scheduled Breo,  - continue prednisone 40mg  daily  - need to decrease dose tomorrow  Hypokalemia - 3.5 yesterday - resolved  Leukocytosis, thrombocytosis - improved leukocytosis to 12.8  GERD - continue PPI   Normocytic anemia- 10 >>8.  - hemoccult stool - H/H of 8.2/26.2  - repeat CBC in am  D-dimer elevation - D-dimer mildly elevated to 0.83 in Ed, most  likely secondary to PNA, cavitary/necrotic lung lesions - [place] of b/l LE negative for DVT   DVT prophylaxis:sq Lovenox Code Status:DNR Family Communication:Discussed with patient Disposition Plan:  Pending discussion with [name]   Consultants:   Pulmonology  Palliative Care  Procedures:   Echocardiogram  Antimicrobials:   Meropenem  Vancomycin    Subjective: Patient sleeping.  When awoken she reports that she feels as though her breathing is about the same as yesterday- still has a significant cough.  Objective: Vitals:   08/25/16 1445 08/25/16 2026 08/26/16 0601 08/26/16 0755  BP: 127/66 (!) 150/80 (!) 144/84   Pulse: 97 88 91   Resp: 18 18 18    Temp: 98.2 F (36.8 C) 98.1 F (36.7 C) 97.7 F (36.5 C)   TempSrc: Oral Oral Oral   SpO2: 100% 100% 100% 98%  Weight:      Height:        Intake/Output Summary (Last 24 hours) at 08/26/16 1219 Last data filed at 08/26/16 0900  Gross per 24 hour  Intake              240 ml  Output                0 ml  Net              240 ml   Filed Weights   08/21/16 1517 08/21/16 1948  Weight: 60.8 kg (134 lb) 61.2 kg (134 lb 14.4 oz)    Examination:  General exam: Appears calm and comfortable  Respiratory system: rales in the middle lung zones bilaterally, rhonchi throughout otherwise Cardiovascular system: S1 & S2 heard, RRR. No JVD, murmurs, rubs, gallops or clicks. 3+ bilateral pedal edema and 2+ edema to the knees Gastrointestinal system: Abdomen is nondistended, soft and nontender. No organomegaly or masses felt. Normal bowel sounds heard. Central nervous system: Alert and oriented. No focal neurological deficits. Extremities: Symmetric 4 x 5 power. Skin: No rashes, lesions or ulcers, scattered ecchymoses Psychiatry:  Mood & affect appropriate   Data Reviewed: I have personally reviewed following labs and imaging studies  CBC:  Recent Labs Lab 08/21/16 1540 08/22/16 0559 08/23/16 0545  08/25/16 0409  WBC 43.6* 39.0* 27.1* 12.8*  NEUTROABS 38.7* 34.5*  --   --   HGB 10.0* 8.7* 8.4* 8.2*  HCT 31.7* 27.4* 26.6* 26.2*  MCV 90.3 89.8 90.5 89.4  PLT 684* 642* 670* 283*   Basic Metabolic Panel:  Recent Labs Lab 08/21/16 1540 08/22/16 0559 08/23/16 0545 08/24/16 0632 08/25/16 0409  NA 134* 135 136 134* 135  K 2.7* 3.3* 4.0 3.9 3.5  CL 84* 92* 95* 94* 94*  CO2 39* 33* 34* 35* 36*  GLUCOSE 113* 77 88 79 83  BUN 15 12 6  5* <5*  CREATININE 0.42* 0.31* <0.30* <0.30* <0.30*  CALCIUM 8.7* 7.9* 8.4* 8.4* 8.4*  MG  --  2.3  --   --   --    GFR: CrCl cannot be calculated (This lab value cannot be used to calculate CrCl because it is not a number: <0.30). Liver Function Tests:  Recent Labs Lab 08/22/16 0559  AST 15  ALT 14  ALKPHOS 150*  BILITOT 0.3  PROT 4.7*  ALBUMIN 1.7*   No results for input(s): LIPASE, AMYLASE in the last 168 hours. No results for input(s): AMMONIA in the last 168 hours. Coagulation Profile: No results for input(s): INR, PROTIME in  the last 168 hours. Cardiac Enzymes: No results for input(s): CKTOTAL, CKMB, CKMBINDEX, TROPONINI in the last 168 hours. BNP (last 3 results) No results for input(s): PROBNP in the last 8760 hours. HbA1C: No results for input(s): HGBA1C in the last 72 hours. CBG:  Recent Labs Lab 08/21/16 1601  GLUCAP 121*   Lipid Profile: No results for input(s): CHOL, HDL, LDLCALC, TRIG, CHOLHDL, LDLDIRECT in the last 72 hours. Thyroid Function Tests: No results for input(s): TSH, T4TOTAL, FREET4, T3FREE, THYROIDAB in the last 72 hours. Anemia Panel: No results for input(s): VITAMINB12, FOLATE, FERRITIN, TIBC, IRON, RETICCTPCT in the last 72 hours. Sepsis Labs:  Recent Labs Lab 08/21/16 1729  LATICACIDVEN 1.63    Recent Results (from the past 240 hour(s))  Blood culture (routine x 2)     Status: None   Collection Time: 08/21/16  5:19 PM  Result Value Ref Range Status   Specimen Description BLOOD LEFT ARM   Final   Special Requests   Final    BOTTLES DRAWN AEROBIC ONLY Blood Culture results may not be optimal due to an inadequate volume of blood received in culture bottles   Culture NO GROWTH 5 DAYS  Final   Report Status 08/26/2016 FINAL  Final  Blood culture (routine x 2)     Status: None   Collection Time: 08/21/16  5:19 PM  Result Value Ref Range Status   Specimen Description LEFT ANTECUBITAL  Final   Special Requests   Final    BOTTLES DRAWN AEROBIC ONLY Blood Culture results may not be optimal due to an inadequate volume of blood received in culture bottles   Culture NO GROWTH 5 DAYS  Final   Report Status 08/26/2016 FINAL  Final  MRSA PCR Screening     Status: None   Collection Time: 08/22/16  1:40 AM  Result Value Ref Range Status   MRSA by PCR NEGATIVE NEGATIVE Final    Comment:        The GeneXpert MRSA Assay (FDA approved for NASAL specimens only), is one component of a comprehensive MRSA colonization surveillance program. It is not intended to diagnose MRSA infection nor to guide or monitor treatment for MRSA infections.   Culture, sputum-assessment     Status: None   Collection Time: 08/23/16  6:25 PM  Result Value Ref Range Status   Specimen Description EXPECTORATED SPUTUM  Final   Special Requests NONE  Final   Sputum evaluation   Final    THIS SPECIMEN IS ACCEPTABLE FOR SPUTUM CULTURE Performed at [facility]    Report Status 08/23/2016 FINAL  Final  Culture, respiratory (NON-Expectorated)     Status: None   Collection Time: 08/23/16  6:25 PM  Result Value Ref Range Status   Specimen Description EXPECTORATED SPUTUM  Final   Special Requests NONE Reflexed from [id number]  Final   Gram Stain   Final    FEW WBC PRESENT, PREDOMINANTLY PMN RARE YEAST Performed at [facility], [address] [place], [place] [address]    Culture FEW PSEUDOMONAS AERUGINOSA  Final   Report Status 08/26/2016 FINAL  Final   Organism ID, Bacteria PSEUDOMONAS AERUGINOSA   Final      Susceptibility   Pseudomonas aeruginosa - MIC*    CEFTAZIDIME 4 SENSITIVE Sensitive     CIPROFLOXACIN <=0.25 SENSITIVE Sensitive     GENTAMICIN 2 SENSITIVE Sensitive     IMIPENEM 2 SENSITIVE Sensitive     PIP/TAZO 8 SENSITIVE Sensitive  CEFEPIME 2 SENSITIVE Sensitive     * FEW PSEUDOMONAS AERUGINOSA         Radiology Studies: No results found.      Scheduled Meds: . enoxaparin (LOVENOX) injection  40 mg Subcutaneous Q24H  . fluticasone furoate-vilanterol  1 puff Inhalation Daily  . guaiFENesin  30 mL Oral Q6H  . lidocaine  1 patch Transdermal Q24H  . mirtazapine  15 mg Oral QHS  . pantoprazole sodium  40 mg Per Tube Daily  . predniSONE  40 mg Oral Q breakfast   Continuous Infusions: . meropenem (MERREM) IV Stopped (08/26/16 0939)     LOS: 5 days    Time spent: 30 minutes    Loretha Stapler, MD [facility] Pager [phone]  If 7PM-7AM, please contact night-coverage [email or web address] Password [email or web address] 08/26/2016, 12:19 PM

## 2016-08-27 LAB — BASIC METABOLIC PANEL
ANION GAP: 5 (ref 5–15)
BUN: 7 mg/dL (ref 6–20)
CALCIUM: 8.4 mg/dL — AB (ref 8.9–10.3)
CO2: 35 mmol/L — ABNORMAL HIGH (ref 22–32)
Chloride: 97 mmol/L — ABNORMAL LOW (ref 101–111)
Creatinine, Ser: <0.3 mg/dL — ABNORMAL LOW (ref 0.44–1.00)
GLUCOSE: 73 mg/dL (ref 65–99)
Potassium: 3.6 mmol/L (ref 3.5–5.1)
SODIUM: 137 mmol/L (ref 135–145)

## 2016-08-27 LAB — CBC WITH DIFFERENTIAL/PLATELET
BAND NEUTROPHILS: 0 %
BASOS ABS: 0 10*3/uL (ref 0.0–0.1)
BASOS PCT: 0 %
Blasts: 0 %
EOS PCT: 0 %
Eosinophils Absolute: 0 10*3/uL (ref 0.0–0.7)
HCT: 25.5 % — ABNORMAL LOW (ref 36.0–46.0)
Hemoglobin: 8.2 g/dL — ABNORMAL LOW (ref 12.0–15.0)
LYMPHS ABS: 2 10*3/uL (ref 0.7–4.0)
Lymphocytes Relative: 21 %
MCH: 28.8 pg (ref 26.0–34.0)
MCHC: 32.2 g/dL (ref 30.0–36.0)
MCV: 89.5 fL (ref 78.0–100.0)
METAMYELOCYTES PCT: 0 %
MONO ABS: 0.1 10*3/uL (ref 0.1–1.0)
MONOS PCT: 1 %
Myelocytes: 4 %
NEUTROS ABS: 7.4 10*3/uL (ref 1.7–7.7)
Neutrophils Relative %: 74 %
Other: 0 %
PLATELETS: 729 10*3/uL — AB (ref 150–400)
Promyelocytes Absolute: 0 %
RBC: 2.85 MIL/uL — AB (ref 3.87–5.11)
RDW: 17.1 % — AB (ref 11.5–15.5)
WBC: 9.5 10*3/uL (ref 4.0–10.5)
nRBC: 0 /100 WBC

## 2016-08-27 MED ORDER — PREDNISONE 10 MG PO TABS
10.0000 mg | ORAL_TABLET | Freq: Every day | ORAL | Status: DC
  Administered 2016-08-28: 10 mg via ORAL
  Filled 2016-08-27: qty 1

## 2016-08-27 NOTE — Progress Notes (Signed)
PROGRESS NOTE    Leah Dalton  MOQ:[id number] DOB: 10/26/1945 DOA: 08/21/2016 PCP: Crisoforo Oxford, MD    Brief Narrative:  Shell Blanchette Halbrookis a 71 y.o.femalewith medical history significant for COPD with chronic hypercarbic respiratory failure, GERD with esophagitis, hereditary hemachromatosis, history of polycythemia requiring therapeutic phlebotomy remotely, now presenting from her nursing facility for evaluation of progressive cough and dyspnea with right-sided pleuritic pain- 1 wk.  ED Course:Upon arrival to the ED, patient is found to be afebrile, saturating adequately on her usual supplemental oxygen, tachypneic in the high 20s, tachycardic to 127, and with stable blood pressure. EKG features sinus tachycardia with rate 120 and PVC. Chest x-ray is notable for right middle lobe pneumonia. Chemistry panel reveals a potassium of 2.7 and bicarbonate of 39. CBC was notable for a leukocytosis to 43,600, and thrombocytosis to 684,000, and a stable normocytic anemia with hemoglobin of 10.0.  CT showing numerous, possibly septic, embolic.  Pulmonology consulted.  Echocardiogram ordered and showed pericardial effusion with prominent fat pad; repeat echo in two days was recommended.  Palliative care was consulted given patient's COPD and functional decline over the past 6 months.   Assessment & Plan:   Principal Problem:   HCAP (healthcare-associated pneumonia) Active Problems:   COPD (chronic obstructive pulmonary disease) (HCC)   Acute-on-chronic respiratory failure (HCC)   Reflux esophagitis   IDA (iron deficiency anemia)   Acute hypokalemia   Thrombocytosis (HCC)   Leukocytosis   Positive D dimer   Pneumonia of right middle lobe due to infectious organism [facility])   Palliative care encounter   Goals of care, counseling/discussion   DNR (do not resuscitate) discussion   Hypoalbuminemia   HCAP   - CT chest with contrast- RML cavitary pneumonia, with fluid level, other areas of pneumonia  with necrotic/cavitary features consider septic embolic process., Small right pleural effusion, recommend chest CT follow-up in 3 months. - echocardiogram (08/23/16) showing Left ventricle: The cavity size was normal. Wall thickness was normal. Systolic function was normal. The estimated ejection fraction was in the range of 60% to 65%. Doppler parameters are consistent with abnormal left ventricular relaxation (grade 1 diastolic dysfunction). Pericardium, extracardiac: There is a mild pericardial effusion adjacent to the RV free wall and RA. Very prominent fat pad adjacent to RV. Discussed appearance with radiology based on recent CT scan which is also consistent with very prominent fat pad. Mixed hemodynamic findings. Some views suggest RA diastolic collapse, there is also evidence of MV inflow respiratory variation. IVC relatively normal, no evidence of RV diastolic collapse. Overall findings are not specific for tamponade. - repeat echo today showing trivial pericardial effusion and prominent pericardial fat pad - consult cardiology for evaluation about whether or not TEE appropriate - Blood cultures 2: NG x 5 days - Pulmonology following - continue Cefepime - discussed on phone with infectious disease- will need at least 2 weeks of IV antibiotic therapy, perhaps even 4 weeks - will need transition to oral antibiotics after IV antibiotic treatment course  COPD with chronic hypercarbic respiratory failure, steroid-dependent - no wheezing, stable. On home O2 - Continue scheduled Breo,  - decreasing prednisone to 10mg  today  Hypokalemia - 3.6 today - resolved  Leukocytosis, thrombocytosis - improved leukocytosis to 9.5  GERD - continue PPI   Normocytic anemia- 10 >>8.  - hemoccult stool - H/H of 8.2/25.5  D-dimer elevation - D-dimer mildly elevated to 0.83 in Ed, most likely secondary to PNA, cavitary/necrotic lung lesions - [place] of b/l LE  negative for DVT   DVT  prophylaxis:sq Lovenox Code Status:DNR Family Communication:Discussed with patient and her son Disposition Plan:  Pending TEE; will likely discharge tomorrow   Consultants:   Pulmonology  Palliative Care  Cardiology  Procedures:   Echocardiogram  Antimicrobials:   Meropenem  Vancomycin    Subjective: Patient talking with her son.  Slightly more short of breath today.  Wants to discuss possible TEE with her son- isn't sure she wants it even if she is cleared by cardiology to have it.  Reports that she just wants to restart her rehab at the [facility].  Objective: Vitals:   08/26/16 1400 08/26/16 2032 08/27/16 0502 08/27/16 0748  BP: 115/78 (!) 146/81 138/87   Pulse: 96 94 86   Resp: 18 18 18    Temp: 98.1 F (36.7 C) 98.3 F (36.8 C) 98.6 F (37 C)   TempSrc:  Oral Oral   SpO2: 96% 100% 100% 98%  Weight:      Height:        Intake/Output Summary (Last 24 hours) at 08/27/16 1408 Last data filed at 08/27/16 1118  Gross per 24 hour  Intake              580 ml  Output              150 ml  Net              430 ml   Filed Weights   08/21/16 1517 08/21/16 1948  Weight: 60.8 kg (134 lb) 61.2 kg (134 lb 14.4 oz)    Examination:  General exam:Appears calm and comfortable  Respiratory system: rhonchi throughout lung fields, patient speaking in phrases and appears more short of breath Cardiovascular system:S1 &S2 heard, RRR. No JVD, murmurs, rubs, gallops or clicks. 3+ bilateral pedal edema and 2+ edema to the knees Gastrointestinal system:Abdomen is nondistended, soft and nontender. No organomegaly or masses felt. Normal bowel sounds heard. Central nervous system:Alert and oriented. No focal neurological deficits. Extremities: Symmetric 4x 5 power. Skin: No rashes, lesions or ulcers, scattered ecchymoses Psychiatry:Mood &affect appropriate  Data Reviewed: I have personally reviewed following labs and imaging studies  CBC:  Recent Labs Lab  08/21/16 1540 08/22/16 0559 08/23/16 0545 08/25/16 0409 08/27/16 0732  WBC 43.6* 39.0* 27.1* 12.8* 9.5  NEUTROABS 38.7* 34.5*  --   --  7.4  HGB 10.0* 8.7* 8.4* 8.2* 8.2*  HCT 31.7* 27.4* 26.6* 26.2* 25.5*  MCV 90.3 89.8 90.5 89.4 89.5  PLT 684* 642* 670* 739* 196*   Basic Metabolic Panel:  Recent Labs Lab 08/22/16 0559 08/23/16 0545 08/24/16 0632 08/25/16 0409 08/27/16 0732  NA 135 136 134* 135 137  K 3.3* 4.0 3.9 3.5 3.6  CL 92* 95* 94* 94* 97*  CO2 33* 34* 35* 36* 35*  GLUCOSE 77 88 79 83 73  BUN 12 6 5* <5* 7  CREATININE 0.31* <0.30* <0.30* <0.30* <0.30*  CALCIUM 7.9* 8.4* 8.4* 8.4* 8.4*  MG 2.3  --   --   --   --    GFR: CrCl cannot be calculated (This lab value cannot be used to calculate CrCl because it is not a number: <0.30). Liver Function Tests:  Recent Labs Lab 08/22/16 0559  AST 15  ALT 14  ALKPHOS 150*  BILITOT 0.3  PROT 4.7*  ALBUMIN 1.7*   No results for input(s): LIPASE, AMYLASE in the last 168 hours. No results for input(s): AMMONIA in the last 168 hours. Coagulation Profile:  No results for input(s): INR, PROTIME in the last 168 hours. Cardiac Enzymes: No results for input(s): CKTOTAL, CKMB, CKMBINDEX, TROPONINI in the last 168 hours. BNP (last 3 results) No results for input(s): PROBNP in the last 8760 hours. HbA1C: No results for input(s): HGBA1C in the last 72 hours. CBG:  Recent Labs Lab 08/21/16 1601  GLUCAP 121*   Lipid Profile: No results for input(s): CHOL, HDL, LDLCALC, TRIG, CHOLHDL, LDLDIRECT in the last 72 hours. Thyroid Function Tests: No results for input(s): TSH, T4TOTAL, FREET4, T3FREE, THYROIDAB in the last 72 hours. Anemia Panel: No results for input(s): VITAMINB12, FOLATE, FERRITIN, TIBC, IRON, RETICCTPCT in the last 72 hours. Sepsis Labs:  Recent Labs Lab 08/21/16 1729  LATICACIDVEN 1.63    Recent Results (from the past 240 hour(s))  Blood culture (routine x 2)     Status: None   Collection Time:  08/21/16  5:19 PM  Result Value Ref Range Status   Specimen Description BLOOD LEFT ARM  Final   Special Requests   Final    BOTTLES DRAWN AEROBIC ONLY Blood Culture results may not be optimal due to an inadequate volume of blood received in culture bottles   Culture NO GROWTH 5 DAYS  Final   Report Status 08/26/2016 FINAL  Final  Blood culture (routine x 2)     Status: None   Collection Time: 08/21/16  5:19 PM  Result Value Ref Range Status   Specimen Description LEFT ANTECUBITAL  Final   Special Requests   Final    BOTTLES DRAWN AEROBIC ONLY Blood Culture results may not be optimal due to an inadequate volume of blood received in culture bottles   Culture NO GROWTH 5 DAYS  Final   Report Status 08/26/2016 FINAL  Final  MRSA PCR Screening     Status: None   Collection Time: 08/22/16  1:40 AM  Result Value Ref Range Status   MRSA by PCR NEGATIVE NEGATIVE Final    Comment:        The GeneXpert MRSA Assay (FDA approved for NASAL specimens only), is one component of a comprehensive MRSA colonization surveillance program. It is not intended to diagnose MRSA infection nor to guide or monitor treatment for MRSA infections.   Culture, sputum-assessment     Status: None   Collection Time: 08/23/16  6:25 PM  Result Value Ref Range Status   Specimen Description EXPECTORATED SPUTUM  Final   Special Requests NONE  Final   Sputum evaluation   Final    THIS SPECIMEN IS ACCEPTABLE FOR SPUTUM CULTURE Performed at [facility]    Report Status 08/23/2016 FINAL  Final  Culture, respiratory (NON-Expectorated)     Status: None   Collection Time: 08/23/16  6:25 PM  Result Value Ref Range Status   Specimen Description EXPECTORATED SPUTUM  Final   Special Requests NONE Reflexed from [id number]  Final   Gram Stain   Final    FEW WBC PRESENT, PREDOMINANTLY PMN RARE YEAST Performed at [facility], [address] [place], [place] [address]    Culture FEW PSEUDOMONAS AERUGINOSA  Final    Report Status 08/26/2016 FINAL  Final   Organism ID, Bacteria PSEUDOMONAS AERUGINOSA  Final      Susceptibility   Pseudomonas aeruginosa - MIC*    CEFTAZIDIME 4 SENSITIVE Sensitive     CIPROFLOXACIN <=0.25 SENSITIVE Sensitive     GENTAMICIN 2 SENSITIVE Sensitive     IMIPENEM 2 SENSITIVE Sensitive     PIP/TAZO  8 SENSITIVE Sensitive     CEFEPIME 2 SENSITIVE Sensitive     * FEW PSEUDOMONAS AERUGINOSA         Radiology Studies: No results found.      Scheduled Meds: . enoxaparin (LOVENOX) injection  40 mg Subcutaneous Q24H  . fluticasone furoate-vilanterol  1 puff Inhalation Daily  . guaiFENesin  30 mL Oral Q6H  . lidocaine  1 patch Transdermal Q24H  . mirtazapine  15 mg Oral QHS  . pantoprazole sodium  40 mg Per Tube Daily  . predniSONE  40 mg Oral Q breakfast   Continuous Infusions: . ceFEPime (MAXIPIME) IV 2 g (08/27/16 1326)     LOS: 6 days    Time spent: 25 minutes    Loretha Stapler, MD [facility] Pager [phone]  If 7PM-7AM, please contact night-coverage [email or web address] Password [email or web address] 08/27/2016, 2:08 PM

## 2016-08-27 NOTE — Progress Notes (Signed)
Subjective: She says she feels a little better. She is still a little confused about exactly what's going on and I told her again that she has a severe pneumonia and he is caused by Pseudomonas and that will require IV treatment for at least 2 weeks and I would probably plan for 1 month. She's worried about whether her nursing home will be able to do that and I assured her that we can probably get that done at her nursing home. She has no other new complaints.  Objective: Vital signs in last 24 hours: Temp:  [98.1 F (36.7 C)-98.6 F (37 C)] 98.6 F (37 C) (06/03 0502) Pulse Rate:  [86-96] 86 (06/03 0502) Resp:  [18] 18 (06/03 0502) BP: (115-146)/(78-87) 138/87 (06/03 0502) SpO2:  [96 %-100 %] 98 % (06/03 0748) Weight change:  Last BM Date: 08/25/16  Intake/Output from previous day: 06/02 0701 - 06/03 0700 In: 820 [P.O.:720; IV Piggyback:100] Out: 600 [Urine:600]  PHYSICAL EXAM General appearance: alert, cooperative and mild distress Resp: rhonchi bilaterally Cardio: regular rate and rhythm, S1, S2 normal, no murmur, click, rub or gallop GI: soft, non-tender; bowel sounds normal; no masses,  no organomegaly Extremities: She still has significant edema in dependent areas and in her feet Skin warm and dry. Mucous membranes are moist  Lab Results:  Results for orders placed or performed during the hospital encounter of 08/21/16 (from the past 48 hour(s))  CBC with Differential/Platelet     Status: Abnormal (Preliminary result)   Collection Time: 08/27/16  7:32 AM  Result Value Ref Range   WBC 9.5 4.0 - 10.5 K/uL   RBC 2.85 (L) 3.87 - 5.11 MIL/uL   Hemoglobin 8.2 (L) 12.0 - 15.0 g/dL   HCT 25.5 (L) 36.0 - 46.0 %   MCV 89.5 78.0 - 100.0 fL   MCH 28.8 26.0 - 34.0 pg   MCHC 32.2 30.0 - 36.0 g/dL   RDW 17.1 (H) 11.5 - 15.5 %   Platelets 729 (H) 150 - 400 K/uL   Neutrophils Relative % PENDING %   Neutro Abs PENDING 1.7 - 7.7 K/uL   Band Neutrophils PENDING %   Lymphocytes  Relative PENDING %   Lymphs Abs PENDING 0.7 - 4.0 K/uL   Monocytes Relative PENDING %   Monocytes Absolute PENDING 0.1 - 1.0 K/uL   Eosinophils Relative PENDING %   Eosinophils Absolute PENDING 0.0 - 0.7 K/uL   Basophils Relative PENDING %   Basophils Absolute PENDING 0.0 - 0.1 K/uL   WBC Morphology PENDING    RBC Morphology PENDING    Smear Review PENDING    nRBC PENDING 0 /100 WBC   Metamyelocytes Relative PENDING %   Myelocytes PENDING %   Promyelocytes Absolute PENDING %   Blasts PENDING %  Basic metabolic panel     Status: Abnormal   Collection Time: 08/27/16  7:32 AM  Result Value Ref Range   Sodium 137 135 - 145 mmol/L   Potassium 3.6 3.5 - 5.1 mmol/L   Chloride 97 (L) 101 - 111 mmol/L   CO2 35 (H) 22 - 32 mmol/L   Glucose, Bld 73 65 - 99 mg/dL   BUN 7 6 - 20 mg/dL   Creatinine, Ser <0.30 (L) 0.44 - 1.00 mg/dL   Calcium 8.4 (L) 8.9 - 10.3 mg/dL   GFR calc non Af Amer NOT CALCULATED >60 mL/min   GFR calc Af Amer NOT CALCULATED >60 mL/min    Comment: (NOTE) The eGFR has been calculated  using the CKD EPI equation. This calculation has not been validated in all clinical situations. eGFR's persistently <60 mL/min signify possible Chronic Kidney Disease.    Anion gap 5 5 - 15    ABGS No results for input(s): PHART, PO2ART, TCO2, HCO3 in the last 72 hours.  Invalid input(s): PCO2 CULTURES Recent Results (from the past 240 hour(s))  Blood culture (routine x 2)     Status: None   Collection Time: 08/21/16  5:19 PM  Result Value Ref Range Status   Specimen Description BLOOD LEFT ARM  Final   Special Requests   Final    BOTTLES DRAWN AEROBIC ONLY Blood Culture results may not be optimal due to an inadequate volume of blood received in culture bottles   Culture NO GROWTH 5 DAYS  Final   Report Status 08/26/2016 FINAL  Final  Blood culture (routine x 2)     Status: None   Collection Time: 08/21/16  5:19 PM  Result Value Ref Range Status   Specimen Description LEFT  ANTECUBITAL  Final   Special Requests   Final    BOTTLES DRAWN AEROBIC ONLY Blood Culture results may not be optimal due to an inadequate volume of blood received in culture bottles   Culture NO GROWTH 5 DAYS  Final   Report Status 08/26/2016 FINAL  Final  MRSA PCR Screening     Status: None   Collection Time: 08/22/16  1:40 AM  Result Value Ref Range Status   MRSA by PCR NEGATIVE NEGATIVE Final    Comment:        The GeneXpert MRSA Assay (FDA approved for NASAL specimens only), is one component of a comprehensive MRSA colonization surveillance program. It is not intended to diagnose MRSA infection nor to guide or monitor treatment for MRSA infections.   Culture, sputum-assessment     Status: None   Collection Time: 08/23/16  6:25 PM  Result Value Ref Range Status   Specimen Description EXPECTORATED SPUTUM  Final   Special Requests NONE  Final   Sputum evaluation   Final    THIS SPECIMEN IS ACCEPTABLE FOR SPUTUM CULTURE Performed at [facility]    Report Status 08/23/2016 FINAL  Final  Culture, respiratory (NON-Expectorated)     Status: None   Collection Time: 08/23/16  6:25 PM  Result Value Ref Range Status   Specimen Description EXPECTORATED SPUTUM  Final   Special Requests NONE Reflexed from [id number]  Final   Gram Stain   Final    FEW WBC PRESENT, PREDOMINANTLY PMN RARE YEAST Performed at [facility], [address], [place], [place] [address]    Culture FEW PSEUDOMONAS AERUGINOSA  Final   Report Status 08/26/2016 FINAL  Final   Organism ID, Bacteria PSEUDOMONAS AERUGINOSA  Final      Susceptibility   Pseudomonas aeruginosa - MIC*    CEFTAZIDIME 4 SENSITIVE Sensitive     CIPROFLOXACIN <=0.25 SENSITIVE Sensitive     GENTAMICIN 2 SENSITIVE Sensitive     IMIPENEM 2 SENSITIVE Sensitive     PIP/TAZO 8 SENSITIVE Sensitive     CEFEPIME 2 SENSITIVE Sensitive     * FEW PSEUDOMONAS AERUGINOSA   Studies/Results: No results found.  Medications:  Prior to  Admission:  Prescriptions Prior to Admission  Medication Sig Dispense Refill Last Dose  . acetaminophen (TYLENOL) 325 MG tablet Take 650 mg by mouth every 6 (six) hours as needed for moderate pain.   08/20/2016 at Unknown time  . Amino  Acids-Protein Hydrolys (FEEDING SUPPLEMENT, PRO-STAT 64,) LIQD Take 30 mLs by mouth 2 (two) times daily.   08/21/2016 at Unknown time  . amLODipine (NORVASC) 5 MG tablet Take 5 mg by mouth daily.   08/21/2016 at Unknown time  . aspirin EC 325 MG tablet Take 325 mg by mouth daily.   08/21/2016 at Unknown time  . Cholecalciferol (VITAMIN D-3) 1000 units CAPS Take 1 tablet by mouth daily.   08/21/2016 at Unknown time  . demeclocycline (DECLOMYCIN) 150 MG tablet Take 150 mg by mouth 2 (two) times daily.   08/21/2016 at Unknown time  . docusate sodium (COLACE) 100 MG capsule Take 100 mg by mouth 2 (two) times daily.   08/21/2016 at Unknown time  . ferrous sulfate 300 (60 Fe) MG/5ML syrup Take 300 mg by mouth daily.   08/21/2016 at Unknown time  . fluticasone furoate-vilanterol (BREO ELLIPTA) 200-25 MCG/INH AEPB Inhale 1 puff into the lungs daily.   08/21/2016 at Unknown time  . furosemide (LASIX) 40 MG tablet Take 40 mg by mouth daily.    08/21/2016 at Unknown time  . guaiFENesin-dextromethorphan (ROBITUSSIN DM) 100-10 MG/5ML syrup Take 10 mLs by mouth every 4 (four) hours as needed for cough.   08/20/2016 at Unknown time  . insulin aspart (NOVOLOG) 100 UNIT/ML injection Inject 0-10 Units into the skin 2 (two) times daily. Sliding Scale:: 60-200= 0 units 201-250= 2 units 251-300= 4 units 301-350= 6 units 351-400= 8 units 401-450=10units   08/21/2016 at Unknown time  . ipratropium-albuterol (DUONEB) 0.5-2.5 (3) MG/3ML SOLN Take 3 mLs by nebulization every 6 (six) hours.   08/21/2016 at Unknown time  . mirtazapine (REMERON) 15 MG tablet Take 15 mg by mouth at bedtime.   08/20/2016 at Unknown time  . omeprazole (PRILOSEC) 20 MG capsule Take 20 mg by mouth daily.   08/21/2016 at  Unknown time  . ondansetron (ZOFRAN) 4 MG tablet Take 4 mg by mouth every 8 (eight) hours as needed for nausea or vomiting.   Past Week at Unknown time  . OXYGEN Inhale 3 L into the lungs daily. During day and evening shifts   08/21/2016 at Unknown time  . potassium chloride (KLOR-CON) 20 MEQ packet Take 20 mEq by mouth daily.   08/21/2016 at Unknown time  . predniSONE (DELTASONE) 10 MG tablet Take 10 mg by mouth daily with breakfast.   08/21/2016 at Unknown time  . sodium chloride (OCEAN) 0.65 % SOLN nasal spray Place 1 spray into both nostrils 3 (three) times daily.   08/21/2016 at Unknown time  . vitamin C (ASCORBIC ACID) 500 MG tablet Take 500 mg by mouth daily.   08/21/2016 at Unknown time   Scheduled: . enoxaparin (LOVENOX) injection  40 mg Subcutaneous Q24H  . fluticasone furoate-vilanterol  1 puff Inhalation Daily  . guaiFENesin  30 mL Oral Q6H  . lidocaine  1 patch Transdermal Q24H  . mirtazapine  15 mg Oral QHS  . pantoprazole sodium  40 mg Per Tube Daily  . predniSONE  40 mg Oral Q breakfast   Continuous: . ceFEPime (MAXIPIME) IV Stopped (08/27/16 0532)   MGQ:[id number], diphenhydrAMINE, ipratropium-albuterol, traZODone  Assesment:She was admitted with healthcare associated pneumonia and COPD exacerbation with acute on chronic hypoxic respiratory failure. Her sputum is growing Pseudomonas and that's going to require long-term treatment. There is concern that she might have vegetations and septic emboli. Discussed with Dr.Kadolph . TEE may be helpful but she is very weak. It would also be a possibility just to  treat her for endocarditis. Principal Problem:   HCAP (healthcare-associated pneumonia) Active Problems:   COPD (chronic obstructive pulmonary disease) (HCC)   Acute-on-chronic respiratory failure (HCC)   Reflux esophagitis   IDA (iron deficiency anemia)   Acute hypokalemia   Thrombocytosis (HCC)   Leukocytosis   Positive D dimer   Pneumonia of right middle lobe due to  infectious organism [facility])   Palliative care encounter   Goals of care, counseling/discussion   DNR (do not resuscitate) discussion   Hypoalbuminemia    Plan: As above    LOS: 6 days   Rosette Bellavance L 08/27/2016, 8:29 AM

## 2016-08-28 DIAGNOSIS — R279 Unspecified lack of coordination: Secondary | ICD-10-CM | POA: Diagnosis not present

## 2016-08-28 DIAGNOSIS — Z7951 Long term (current) use of inhaled steroids: Secondary | ICD-10-CM | POA: Diagnosis not present

## 2016-08-28 DIAGNOSIS — Z7189 Other specified counseling: Secondary | ICD-10-CM | POA: Diagnosis not present

## 2016-08-28 DIAGNOSIS — E872 Acidosis: Secondary | ICD-10-CM | POA: Diagnosis present

## 2016-08-28 DIAGNOSIS — J159 Unspecified bacterial pneumonia: Secondary | ICD-10-CM | POA: Diagnosis not present

## 2016-08-28 DIAGNOSIS — R739 Hyperglycemia, unspecified: Secondary | ICD-10-CM | POA: Diagnosis not present

## 2016-08-28 DIAGNOSIS — J9612 Chronic respiratory failure with hypercapnia: Secondary | ICD-10-CM | POA: Diagnosis present

## 2016-08-28 DIAGNOSIS — E871 Hypo-osmolality and hyponatremia: Secondary | ICD-10-CM | POA: Diagnosis not present

## 2016-08-28 DIAGNOSIS — J9611 Chronic respiratory failure with hypoxia: Secondary | ICD-10-CM | POA: Diagnosis present

## 2016-08-28 DIAGNOSIS — E039 Hypothyroidism, unspecified: Secondary | ICD-10-CM | POA: Diagnosis not present

## 2016-08-28 DIAGNOSIS — R2681 Unsteadiness on feet: Secondary | ICD-10-CM | POA: Diagnosis not present

## 2016-08-28 DIAGNOSIS — K219 Gastro-esophageal reflux disease without esophagitis: Secondary | ICD-10-CM | POA: Diagnosis present

## 2016-08-28 DIAGNOSIS — K210 Gastro-esophageal reflux disease with esophagitis: Secondary | ICD-10-CM | POA: Diagnosis not present

## 2016-08-28 DIAGNOSIS — S81801D Unspecified open wound, right lower leg, subsequent encounter: Secondary | ICD-10-CM | POA: Diagnosis not present

## 2016-08-28 DIAGNOSIS — D509 Iron deficiency anemia, unspecified: Secondary | ICD-10-CM | POA: Diagnosis not present

## 2016-08-28 DIAGNOSIS — S51801D Unspecified open wound of right forearm, subsequent encounter: Secondary | ICD-10-CM | POA: Diagnosis not present

## 2016-08-28 DIAGNOSIS — J15212 Pneumonia due to Methicillin resistant Staphylococcus aureus: Secondary | ICD-10-CM | POA: Diagnosis not present

## 2016-08-28 DIAGNOSIS — R05 Cough: Secondary | ICD-10-CM | POA: Diagnosis not present

## 2016-08-28 DIAGNOSIS — Z515 Encounter for palliative care: Secondary | ICD-10-CM | POA: Diagnosis not present

## 2016-08-28 DIAGNOSIS — D500 Iron deficiency anemia secondary to blood loss (chronic): Secondary | ICD-10-CM | POA: Diagnosis not present

## 2016-08-28 DIAGNOSIS — J440 Chronic obstructive pulmonary disease with acute lower respiratory infection: Secondary | ICD-10-CM | POA: Diagnosis present

## 2016-08-28 DIAGNOSIS — N170 Acute kidney failure with tubular necrosis: Secondary | ICD-10-CM | POA: Diagnosis present

## 2016-08-28 DIAGNOSIS — Z794 Long term (current) use of insulin: Secondary | ICD-10-CM | POA: Diagnosis not present

## 2016-08-28 DIAGNOSIS — E8809 Other disorders of plasma-protein metabolism, not elsewhere classified: Secondary | ICD-10-CM | POA: Diagnosis not present

## 2016-08-28 DIAGNOSIS — R7989 Other specified abnormal findings of blood chemistry: Secondary | ICD-10-CM | POA: Diagnosis not present

## 2016-08-28 DIAGNOSIS — E119 Type 2 diabetes mellitus without complications: Secondary | ICD-10-CM | POA: Diagnosis not present

## 2016-08-28 DIAGNOSIS — E7800 Pure hypercholesterolemia, unspecified: Secondary | ICD-10-CM | POA: Diagnosis not present

## 2016-08-28 DIAGNOSIS — Z7401 Bed confinement status: Secondary | ICD-10-CM | POA: Diagnosis not present

## 2016-08-28 DIAGNOSIS — J181 Lobar pneumonia, unspecified organism: Secondary | ICD-10-CM | POA: Diagnosis not present

## 2016-08-28 DIAGNOSIS — J439 Emphysema, unspecified: Secondary | ICD-10-CM | POA: Diagnosis not present

## 2016-08-28 DIAGNOSIS — R278 Other lack of coordination: Secondary | ICD-10-CM | POA: Diagnosis not present

## 2016-08-28 DIAGNOSIS — E43 Unspecified severe protein-calorie malnutrition: Secondary | ICD-10-CM | POA: Diagnosis not present

## 2016-08-28 DIAGNOSIS — B9562 Methicillin resistant Staphylococcus aureus infection as the cause of diseases classified elsewhere: Secondary | ICD-10-CM | POA: Diagnosis not present

## 2016-08-28 DIAGNOSIS — D72823 Leukemoid reaction: Secondary | ICD-10-CM | POA: Diagnosis not present

## 2016-08-28 DIAGNOSIS — F331 Major depressive disorder, recurrent, moderate: Secondary | ICD-10-CM | POA: Diagnosis not present

## 2016-08-28 DIAGNOSIS — F339 Major depressive disorder, recurrent, unspecified: Secondary | ICD-10-CM | POA: Diagnosis not present

## 2016-08-28 DIAGNOSIS — M810 Age-related osteoporosis without current pathological fracture: Secondary | ICD-10-CM | POA: Diagnosis present

## 2016-08-28 DIAGNOSIS — R404 Transient alteration of awareness: Secondary | ICD-10-CM | POA: Diagnosis not present

## 2016-08-28 DIAGNOSIS — J441 Chronic obstructive pulmonary disease with (acute) exacerbation: Secondary | ICD-10-CM | POA: Diagnosis not present

## 2016-08-28 DIAGNOSIS — Z741 Need for assistance with personal care: Secondary | ICD-10-CM | POA: Diagnosis not present

## 2016-08-28 DIAGNOSIS — R531 Weakness: Secondary | ICD-10-CM | POA: Diagnosis not present

## 2016-08-28 DIAGNOSIS — D751 Secondary polycythemia: Secondary | ICD-10-CM | POA: Diagnosis not present

## 2016-08-28 DIAGNOSIS — Z87891 Personal history of nicotine dependence: Secondary | ICD-10-CM | POA: Diagnosis not present

## 2016-08-28 DIAGNOSIS — J9610 Chronic respiratory failure, unspecified whether with hypoxia or hypercapnia: Secondary | ICD-10-CM | POA: Diagnosis not present

## 2016-08-28 DIAGNOSIS — T501X5A Adverse effect of loop [high-ceiling] diuretics, initial encounter: Secondary | ICD-10-CM | POA: Diagnosis present

## 2016-08-28 DIAGNOSIS — Z7982 Long term (current) use of aspirin: Secondary | ICD-10-CM | POA: Diagnosis not present

## 2016-08-28 DIAGNOSIS — J189 Pneumonia, unspecified organism: Secondary | ICD-10-CM | POA: Diagnosis not present

## 2016-08-28 DIAGNOSIS — Z7952 Long term (current) use of systemic steroids: Secondary | ICD-10-CM | POA: Diagnosis not present

## 2016-08-28 DIAGNOSIS — R1319 Other dysphagia: Secondary | ICD-10-CM | POA: Diagnosis not present

## 2016-08-28 DIAGNOSIS — R600 Localized edema: Secondary | ICD-10-CM | POA: Diagnosis not present

## 2016-08-28 DIAGNOSIS — J9621 Acute and chronic respiratory failure with hypoxia: Secondary | ICD-10-CM | POA: Diagnosis not present

## 2016-08-28 DIAGNOSIS — Z66 Do not resuscitate: Secondary | ICD-10-CM | POA: Diagnosis present

## 2016-08-28 DIAGNOSIS — Z79899 Other long term (current) drug therapy: Secondary | ICD-10-CM | POA: Diagnosis not present

## 2016-08-28 DIAGNOSIS — N179 Acute kidney failure, unspecified: Secondary | ICD-10-CM | POA: Diagnosis not present

## 2016-08-28 DIAGNOSIS — D72829 Elevated white blood cell count, unspecified: Secondary | ICD-10-CM | POA: Diagnosis not present

## 2016-08-28 DIAGNOSIS — I10 Essential (primary) hypertension: Secondary | ICD-10-CM | POA: Diagnosis not present

## 2016-08-28 DIAGNOSIS — R0602 Shortness of breath: Secondary | ICD-10-CM | POA: Diagnosis not present

## 2016-08-28 DIAGNOSIS — R718 Other abnormality of red blood cells: Secondary | ICD-10-CM | POA: Diagnosis not present

## 2016-08-28 DIAGNOSIS — M6281 Muscle weakness (generalized): Secondary | ICD-10-CM | POA: Diagnosis not present

## 2016-08-28 DIAGNOSIS — F064 Anxiety disorder due to known physiological condition: Secondary | ICD-10-CM | POA: Diagnosis not present

## 2016-08-28 DIAGNOSIS — R54 Age-related physical debility: Secondary | ICD-10-CM | POA: Diagnosis not present

## 2016-08-28 DIAGNOSIS — D649 Anemia, unspecified: Secondary | ICD-10-CM | POA: Diagnosis not present

## 2016-08-28 DIAGNOSIS — Z90710 Acquired absence of both cervix and uterus: Secondary | ICD-10-CM | POA: Diagnosis not present

## 2016-08-28 DIAGNOSIS — E559 Vitamin D deficiency, unspecified: Secondary | ICD-10-CM | POA: Diagnosis not present

## 2016-08-28 DIAGNOSIS — Y95 Nosocomial condition: Secondary | ICD-10-CM | POA: Diagnosis present

## 2016-08-28 DIAGNOSIS — I330 Acute and subacute infective endocarditis: Secondary | ICD-10-CM | POA: Diagnosis not present

## 2016-08-28 DIAGNOSIS — Z6823 Body mass index (BMI) 23.0-23.9, adult: Secondary | ICD-10-CM | POA: Diagnosis not present

## 2016-08-28 DIAGNOSIS — J449 Chronic obstructive pulmonary disease, unspecified: Secondary | ICD-10-CM | POA: Diagnosis not present

## 2016-08-28 DIAGNOSIS — Z9981 Dependence on supplemental oxygen: Secondary | ICD-10-CM | POA: Diagnosis not present

## 2016-08-28 DIAGNOSIS — D638 Anemia in other chronic diseases classified elsewhere: Secondary | ICD-10-CM | POA: Diagnosis present

## 2016-08-28 DIAGNOSIS — D473 Essential (hemorrhagic) thrombocythemia: Secondary | ICD-10-CM | POA: Diagnosis not present

## 2016-08-28 DIAGNOSIS — E876 Hypokalemia: Secondary | ICD-10-CM | POA: Diagnosis not present

## 2016-08-28 LAB — BASIC METABOLIC PANEL
Anion gap: 6 (ref 5–15)
BUN: 9 mg/dL (ref 6–20)
CALCIUM: 8.8 mg/dL — AB (ref 8.9–10.3)
CO2: 33 mmol/L — ABNORMAL HIGH (ref 22–32)
Chloride: 104 mmol/L (ref 101–111)
Glucose, Bld: 80 mg/dL (ref 65–99)
Potassium: 3.6 mmol/L (ref 3.5–5.1)
SODIUM: 143 mmol/L (ref 135–145)

## 2016-08-28 MED ORDER — DEXTROSE 5 % IV SOLN: 2.0000 g | Freq: Three times a day (TID) | INTRAVENOUS | Status: AC

## 2016-08-28 NOTE — Discharge Summary (Signed)
Physician Discharge Summary  Leah Dalton:[id number] DOB: [birth year] DOA: 08/21/2016  PCP: Crisoforo Oxford, MD  Admit date: 08/21/2016 Discharge date: 08/28/2016  Admitted From: [facility] Disposition:   [facility]   Recommendations for Outpatient Follow-up:  1. Follow up with PCP in 1-2 weeks 2. See [name] in 1-2 weeks 3. Will need 6 weeks of IV antibiotics 4. Please obtain BMP/CBC in one week then weekly  Home Health: No  Equipment/Devices: Oxygen at 3-4 L [place], PICC line   Discharge Condition: Stable  CODE STATUS: DNR  Diet recommendation: Heart Healthy  Brief/Interim Summary: Leah S Halbrookis a 71 y.o.femalewith medical history significant for COPD with chronic hypercarbic respiratory failure, GERD with esophagitis, hereditary hemachromatosis, history of polycythemia requiring therapeutic phlebotomy remotely, now presenting from her nursing facility for evaluation of progressive cough and dyspnea with right-sided pleuritic pain- 1 wk. ED Course:Upon arrival to the ED, patient is found to be afebrile, saturating adequately on her usual supplemental oxygen, tachypneic in the high 20s, tachycardic to 127, and with stable blood pressure. EKG features sinus tachycardia with rate 120 and PVC. Chest x-ray is notable for right middle lobe pneumonia. Chemistry panel reveals a potassium of 2.7 and bicarbonate of 39. CBC was notable for a leukocytosis to 43,600, and thrombocytosis to 684,000, and a stable normocytic anemia with hemoglobin of 10.0.  CT showing numerous, possibly septic, embolic.  Pulmonology consulted.  Echocardiogram ordered and showed pericardial effusion with prominent fat pad; repeat echo in two days was recommended.  Palliative care was consulted given patient's COPD and functional decline over the past 6 months.  Patient decided to become a DNR.  Infection was discussed with Dr. Leonie Man of infectious disease who recommended TEE to evaluate for vegetations on heart  valves.  Discussed TEE with Dr. Domenic Polite of cardiology.  Given patient's respiratory status and that she was already concerned about anesthesia decision to treat with 6 weeks of IV antibiotics for possible endocarditis was made.  She was stable to return to the [facility] on 08/28/16 to finish her antibiotics.  Discharge Diagnoses:  Principal Problem:   HCAP (healthcare-associated pneumonia) Active Problems:   COPD (chronic obstructive pulmonary disease) (HCC)   Acute-on-chronic respiratory failure (HCC)   Reflux esophagitis   IDA (iron deficiency anemia)   Acute hypokalemia   Thrombocytosis (HCC)   Leukocytosis   Positive D dimer   Pneumonia of right middle lobe due to infectious organism [facility])   Palliative care encounter   Goals of care, counseling/discussion   DNR (do not resuscitate) discussion   Hypoalbuminemia    Discharge Instructions  Discharge Instructions    Call MD for:  difficulty breathing, headache or visual disturbances    Complete by:  As directed    Call MD for:  extreme fatigue    Complete by:  As directed    Call MD for:  hives    Complete by:  As directed    Call MD for:  persistant dizziness or light-headedness    Complete by:  As directed    Call MD for:  persistant nausea and vomiting    Complete by:  As directed    Call MD for:  severe uncontrolled pain    Complete by:  As directed    Call MD for:  temperature >100.4    Complete by:  As directed    Diet - low sodium heart healthy    Complete by:  As directed    Discharge instructions  Complete by:  As directed    Follow up with [name] Will need IV antibiotics until July 9 Continue PT per facility protocol Consider use of compression stockings/ TED hose Protein shake QID   Increase activity slowly    Complete by:  As directed      Allergies as of 08/28/2016      Reactions   Codeine Itching   Daliresp [roflumilast] Diarrhea   Keflex [cephalexin] Other (See Comments)   Gives pt yeast  infection   Penicillins Itching   Has patient had a PCN reaction causing immediate rash, facial/tongue/throat swelling, SOB or lightheadedness with hypotension: No Has patient had a PCN reaction causing severe rash involving mucus membranes or skin necrosis: Yes Has patient had a PCN reaction that required hospitalization No Has patient had a PCN reaction occurring within the last 10 years: No If all of the above answers are "NO", then may proceed with Cephalosporin use.      Medication List    TAKE these medications   acetaminophen 325 MG tablet Commonly known as:  TYLENOL Take 650 mg by mouth every 6 (six) hours as needed for moderate pain.   amLODipine 5 MG tablet Commonly known as:  NORVASC Take 5 mg by mouth daily.   aspirin EC 325 MG tablet Take 325 mg by mouth daily.   BREO ELLIPTA 200-25 MCG/INH Aepb Generic drug:  fluticasone furoate-vilanterol Inhale 1 puff into the lungs daily.   ceFEPIme 2 g in dextrose 5 % 50 mL Inject 2 g into the vein every 8 (eight) hours.   demeclocycline 150 MG tablet Commonly known as:  DECLOMYCIN Take 150 mg by mouth 2 (two) times daily.   docusate sodium 100 MG capsule Commonly known as:  COLACE Take 100 mg by mouth 2 (two) times daily.   feeding supplement (PRO-STAT 64) Liqd Take 30 mLs by mouth 2 (two) times daily.   ferrous sulfate 300 (60 Fe) MG/5ML syrup Take 300 mg by mouth daily.   furosemide 40 MG tablet Commonly known as:  LASIX Take 40 mg by mouth daily.   guaiFENesin-dextromethorphan 100-10 MG/5ML syrup Commonly known as:  ROBITUSSIN DM Take 10 mLs by mouth every 4 (four) hours as needed for cough.   insulin aspart 100 UNIT/ML injection Commonly known as:  novoLOG Inject 0-10 Units into the skin 2 (two) times daily. Sliding Scale:: 60-200= 0 units 201-250= 2 units 251-300= 4 units 301-350= 6 units 351-400= 8 units 401-450=10units   ipratropium-albuterol 0.5-2.5 (3) MG/3ML Soln Commonly known as:  DUONEB Take 3  mLs by nebulization every 6 (six) hours.   mirtazapine 15 MG tablet Commonly known as:  REMERON Take 15 mg by mouth at bedtime.   omeprazole 20 MG capsule Commonly known as:  PRILOSEC Take 20 mg by mouth daily.   ondansetron 4 MG tablet Commonly known as:  ZOFRAN Take 4 mg by mouth every 8 (eight) hours as needed for nausea or vomiting.   OXYGEN Inhale 3 L into the lungs daily. During day and evening shifts   potassium chloride 20 MEQ packet Commonly known as:  KLOR-CON Take 20 mEq by mouth daily.   predniSONE 10 MG tablet Commonly known as:  DELTASONE Take 10 mg by mouth daily with breakfast.   sodium chloride 0.65 % Soln nasal spray Commonly known as:  OCEAN Place 1 spray into both nostrils 3 (three) times daily.   vitamin C 500 MG tablet Commonly known as:  ASCORBIC ACID Take 500 mg by  mouth daily.   Vitamin D-3 1000 units Caps Take 1 tablet by mouth daily.       Allergies  Allergen Reactions  . Codeine Itching  . Daliresp [Roflumilast] Diarrhea  . Keflex [Cephalexin] Other (See Comments)    Gives pt yeast infection  . Penicillins Itching    Has patient had a PCN reaction causing immediate rash, facial/tongue/throat swelling, SOB or lightheadedness with hypotension: No Has patient had a PCN reaction causing severe rash involving mucus membranes or skin necrosis: Yes Has patient had a PCN reaction that required hospitalization No Has patient had a PCN reaction occurring within the last 10 years: No If all of the above answers are "NO", then may proceed with Cephalosporin use.     Consultations:  Pulmonology  ID (on phone)  Discussed TEE with cardiology   Procedures/Studies: Dg Chest 2 View  Result Date: 08/21/2016 CLINICAL DATA:  Cough. EXAM: CHEST  2 VIEW COMPARISON:  April 20, 2016 FINDINGS: Right middle lobe infiltrate, new in the interval. No other interval changes or acute abnormalities. IMPRESSION: 1. Right middle lobe pneumonia.  Recommend  follow-up to resolution. 2. Mild anterior wedging of a lower thoracic vertebral body in similar in the interval given difference in positioning. Electronically Signed   By: Dorise Bullion III M.D   On: 08/21/2016 16:56   Ct Chest W Contrast  Result Date: 08/22/2016 CLINICAL DATA:  Pneumonia.  Significantly this leukocytosis (43) EXAM: CT CHEST WITH CONTRAST TECHNIQUE: Multidetector CT imaging of the chest was performed during intravenous contrast administration. CONTRAST:  67mL ISOVUE-300 IOPAMIDOL (ISOVUE-300) INJECTION 61% COMPARISON:  09/30/2008 FINDINGS: Cardiovascular: Normal heart size. Trace pericardial fluid that is low-density. No acute vascular finding. Atherosclerotic calcification. Mediastinum/Nodes: Mild prominence of hilar lymph nodes, considered reactive in this setting. Lungs/Pleura: There are patchy areas of airspace disease seen in the right upper lobe (low-density lobulated abnormality seen anteriorly and more consolidative hypoenhancing consolidations seen medially). There is extensive consolidation in the right middle lobe with an internal cavitation containing fluid level. There is no enhancing capsule to the cavity; size is at least 2 cm. Small nodular opacity in the medial left upper lobe. 9 mm solid nodule in the posterior right upper lobe which does not appear similar to the inflammatory/infectious opacities. Centrilobular emphysema and diffuse airway thickening. Trace right pleural effusion which appears layering. Upper Abdomen: No acute finding. There may be steatosis of the liver. Musculoskeletal: Anterior left third and fourth rib fractures with subacute to chronic appearance. Multiple healing right rib fractures. Chronic appearing T12 superior endplate deformity. IMPRESSION: 1. Right middle lobe cavitary pneumonia with fluid level. The cavity measures at least 2 cm. 2. There are other areas of pneumonia with necrotic/cavitary features, consider septic embolic process. 3. Small  layering right pleural effusion. 4. 9 mm right upper lobe nodule that does not appear infectious/inflammatory. Recommend chest CT follow-up in 3 months. 5. Healing bilateral rib fractures. 6.  Emphysema (ICD10-J43.9). Electronically Signed   By: Monte Fantasia M.D.   On: 08/22/2016 16:27   US Venous Img Lower Bilateral  Result Date: 08/22/2016 CLINICAL DATA:  71 year old female with bilateral lower extremity edema, shortness of breath, abnormal D-dimer. EXAM: BILATERAL LOWER EXTREMITY VENOUS DOPPLER ULTRASOUND TECHNIQUE: Gray-scale sonography with graded compression, as well as color Doppler and duplex ultrasound were performed to evaluate the lower extremity deep venous systems from the level of the common femoral vein and including the common femoral, femoral, profunda femoral, popliteal and calf veins including the posterior tibial, peroneal  and gastrocnemius veins when visible. The superficial great saphenous vein was also interrogated. Spectral Doppler was utilized to evaluate flow at rest and with distal augmentation maneuvers in the common femoral, femoral and popliteal veins. COMPARISON:  None. FINDINGS: RIGHT LOWER EXTREMITY Common Femoral Vein: No evidence of thrombus. Normal compressibility, respiratory phasicity and response to augmentation. Saphenofemoral Junction: No evidence of thrombus. Normal compressibility and flow on color Doppler imaging. Profunda Femoral Vein: No evidence of thrombus. Normal compressibility and flow on color Doppler imaging. Femoral Vein: No evidence of thrombus. Normal compressibility, respiratory phasicity and response to augmentation. Popliteal Vein: No evidence of thrombus. Normal compressibility, respiratory phasicity and response to augmentation. Calf Veins: No evidence of thrombus. Normal compressibility and flow on color Doppler imaging. Superficial Great Saphenous Vein: No evidence of thrombus. Normal compressibility and flow on color Doppler imaging. Venous  Reflux:  None. Other Findings:  None. LEFT LOWER EXTREMITY Common Femoral Vein: No evidence of thrombus. Normal compressibility, respiratory phasicity and response to augmentation. Saphenofemoral Junction: No evidence of thrombus. Normal compressibility and flow on color Doppler imaging. Profunda Femoral Vein: No evidence of thrombus. Normal compressibility and flow on color Doppler imaging. Femoral Vein: No evidence of thrombus. Normal compressibility, respiratory phasicity and response to augmentation. Popliteal Vein: No evidence of thrombus. Normal compressibility, respiratory phasicity and response to augmentation. Calf Veins: No evidence of thrombus. Normal compressibility and flow on color Doppler imaging. Superficial Great Saphenous Vein: No evidence of thrombus. Normal compressibility and flow on color Doppler imaging. Venous Reflux:  None. Other Findings:  None. IMPRESSION: No evidence of DVT within either lower extremity. Electronically Signed   By: Genevie Ann M.D.   On: 08/22/2016 08:58   Echocardiogram: Left ventricle: The cavity size was normal. Wall thickness was normal. Systolic function was normal. The estimated ejection fraction was in the range of 60% to 65%. Doppler parameters are consistent with abnormal left ventricular relaxation (grade 1 diastolic dysfunction). - Aortic valve: Valve area (VTI): 3.6 cm^2. Valve area (Vmax): 3.29 cm^2. - Pericardium, extracardiac: There is a mild pericardial effusion adjacent to the RV free wall and RA. Very prominent fat pad adjacent to RV. Discussed appearance with radiology based on recent CT scan which is also consistent with very prominent fat pad. Mixed hemodynamic findings. Some views suggest RA diastolic collapse, there is also evidence of MV inflow respiratory variation. IVC relatively normal, no evidence of RV diastolic collapse. Overall findings are not specific for tamponade. Technically difficult study. - Based on mixed hemodynamic findings in  setting of pericardial effusion would repeat limited echo in 2 days to evaluate effusion.  Subjective: Patient voices that she feels well this morning- about as well as she normally feels.  Says her breathing is back to baseline.  Is anxious to go back to the [facility] to restart her rehab.  She does not want to wear TED hose.  Discharge Exam: Vitals:   08/28/16 0536 08/28/16 1100  BP: (!) 149/76   Pulse: 97 (!) 120  Resp: 20   Temp: 97.7 F (36.5 C)    Vitals:   08/27/16 2150 08/28/16 0536 08/28/16 0841 08/28/16 1100  BP: (!) 142/79 (!) 149/76    Pulse: (!) 101 97  (!) 120  Resp: 20 20    Temp: 98.4 F (36.9 C) 97.7 F (36.5 C)    TempSrc: Oral Oral    SpO2: 100% 100% 99% 96%  Weight:      Height:        General:  Pt is alert, awake, not in acute distress Cardiovascular: RRR, S1/S2 +, no rubs, no gallops Respiratory: rhonchi through lung fields bialterally Abdominal: Soft, NT, ND, bowel sounds + Extremities: 3+ edema of feet bilaterally, 2+ edema to knees bilaterally    The results of significant diagnostics from this hospitalization (including imaging, microbiology, ancillary and laboratory) are listed below for reference.     Microbiology: Recent Results (from the past 240 hour(s))  Blood culture (routine x 2)     Status: None   Collection Time: 08/21/16  5:19 PM  Result Value Ref Range Status   Specimen Description BLOOD LEFT ARM  Final   Special Requests   Final    BOTTLES DRAWN AEROBIC ONLY Blood Culture results may not be optimal due to an inadequate volume of blood received in culture bottles   Culture NO GROWTH 5 DAYS  Final   Report Status 08/26/2016 FINAL  Final  Blood culture (routine x 2)     Status: None   Collection Time: 08/21/16  5:19 PM  Result Value Ref Range Status   Specimen Description LEFT ANTECUBITAL  Final   Special Requests   Final    BOTTLES DRAWN AEROBIC ONLY Blood Culture results may not be optimal due to an inadequate volume of  blood received in culture bottles   Culture NO GROWTH 5 DAYS  Final   Report Status 08/26/2016 FINAL  Final  MRSA PCR Screening     Status: None   Collection Time: 08/22/16  1:40 AM  Result Value Ref Range Status   MRSA by PCR NEGATIVE NEGATIVE Final    Comment:        The GeneXpert MRSA Assay (FDA approved for NASAL specimens only), is one component of a comprehensive MRSA colonization surveillance program. It is not intended to diagnose MRSA infection nor to guide or monitor treatment for MRSA infections.   Culture, sputum-assessment     Status: None   Collection Time: 08/23/16  6:25 PM  Result Value Ref Range Status   Specimen Description EXPECTORATED SPUTUM  Final   Special Requests NONE  Final   Sputum evaluation   Final    THIS SPECIMEN IS ACCEPTABLE FOR SPUTUM CULTURE Performed at [facility]    Report Status 08/23/2016 FINAL  Final  Culture, respiratory (NON-Expectorated)     Status: None   Collection Time: 08/23/16  6:25 PM  Result Value Ref Range Status   Specimen Description EXPECTORATED SPUTUM  Final   Special Requests NONE Reflexed from [id number]  Final   Gram Stain   Final    FEW WBC PRESENT, PREDOMINANTLY PMN RARE YEAST Performed at [facility], [address] [place], [place] [address]    Culture FEW PSEUDOMONAS AERUGINOSA  Final   Report Status 08/26/2016 FINAL  Final   Organism ID, Bacteria PSEUDOMONAS AERUGINOSA  Final      Susceptibility   Pseudomonas aeruginosa - MIC*    CEFTAZIDIME 4 SENSITIVE Sensitive     CIPROFLOXACIN <=0.25 SENSITIVE Sensitive     GENTAMICIN 2 SENSITIVE Sensitive     IMIPENEM 2 SENSITIVE Sensitive     PIP/TAZO 8 SENSITIVE Sensitive     CEFEPIME 2 SENSITIVE Sensitive     * FEW PSEUDOMONAS AERUGINOSA     Labs: BNP (last 3 results)  Recent Labs  02/20/16 1013 03/09/16 1824  BNP 24.0 53.6   Basic Metabolic Panel:  Recent Labs Lab 08/22/16 0559 08/23/16 0545 08/24/16 4680 08/25/16 0409  08/27/16 0732 08/28/16 3212  NA 135 136 134* 135 137 143  K 3.3* 4.0 3.9 3.5 3.6 3.6  CL 92* 95* 94* 94* 97* 104  CO2 33* 34* 35* 36* 35* 33*  GLUCOSE 77 88 79 83 73 80  BUN 12 6 5* <5* 7 9  CREATININE 0.31* <0.30* <0.30* <0.30* <0.30* <0.30*  CALCIUM 7.9* 8.4* 8.4* 8.4* 8.4* 8.8*  MG 2.3  --   --   --   --   --    Liver Function Tests:  Recent Labs Lab 08/22/16 0559  AST 15  ALT 14  ALKPHOS 150*  BILITOT 0.3  PROT 4.7*  ALBUMIN 1.7*   No results for input(s): LIPASE, AMYLASE in the last 168 hours. No results for input(s): AMMONIA in the last 168 hours. CBC:  Recent Labs Lab 08/21/16 1540 08/22/16 0559 08/23/16 0545 08/25/16 0409 08/27/16 0732  WBC 43.6* 39.0* 27.1* 12.8* 9.5  NEUTROABS 38.7* 34.5*  --   --  7.4  HGB 10.0* 8.7* 8.4* 8.2* 8.2*  HCT 31.7* 27.4* 26.6* 26.2* 25.5*  MCV 90.3 89.8 90.5 89.4 89.5  PLT 684* 642* 670* 739* 729*   Cardiac Enzymes: No results for input(s): CKTOTAL, CKMB, CKMBINDEX, TROPONINI in the last 168 hours. BNP: Invalid input(s): POCBNP CBG:  Recent Labs Lab 08/21/16 1601  GLUCAP 121*   D-Dimer No results for input(s): DDIMER in the last 72 hours. Hgb A1c No results for input(s): HGBA1C in the last 72 hours. Lipid Profile No results for input(s): CHOL, HDL, LDLCALC, TRIG, CHOLHDL, LDLDIRECT in the last 72 hours. Thyroid function studies No results for input(s): TSH, T4TOTAL, T3FREE, THYROIDAB in the last 72 hours.  Invalid input(s): FREET3 Anemia work up No results for input(s): VITAMINB12, FOLATE, FERRITIN, TIBC, IRON, RETICCTPCT in the last 72 hours. Urinalysis No results found for: COLORURINE, APPEARANCEUR, [place], [place], [place], [place], [place], [place], [place], UROBILINOGEN, NITRITE, LEUKOCYTESUR Sepsis Labs Invalid input(s): PROCALCITONIN,  WBC,  LACTICIDVEN Microbiology Recent Results (from the past 240 hour(s))  Blood culture (routine x 2)     Status: None   Collection Time: 08/21/16  5:19 PM   Result Value Ref Range Status   Specimen Description BLOOD LEFT ARM  Final   Special Requests   Final    BOTTLES DRAWN AEROBIC ONLY Blood Culture results may not be optimal due to an inadequate volume of blood received in culture bottles   Culture NO GROWTH 5 DAYS  Final   Report Status 08/26/2016 FINAL  Final  Blood culture (routine x 2)     Status: None   Collection Time: 08/21/16  5:19 PM  Result Value Ref Range Status   Specimen Description LEFT ANTECUBITAL  Final   Special Requests   Final    BOTTLES DRAWN AEROBIC ONLY Blood Culture results may not be optimal due to an inadequate volume of blood received in culture bottles   Culture NO GROWTH 5 DAYS  Final   Report Status 08/26/2016 FINAL  Final  MRSA PCR Screening     Status: None   Collection Time: 08/22/16  1:40 AM  Result Value Ref Range Status   MRSA by PCR NEGATIVE NEGATIVE Final    Comment:        The GeneXpert MRSA Assay (FDA approved for NASAL specimens only), is one component of a comprehensive MRSA colonization surveillance program. It is not intended to diagnose MRSA infection nor to guide or monitor treatment for MRSA infections.   Culture, sputum-assessment     Status: None   Collection  Time: 08/23/16  6:25 PM  Result Value Ref Range Status   Specimen Description EXPECTORATED SPUTUM  Final   Special Requests NONE  Final   Sputum evaluation   Final    THIS SPECIMEN IS ACCEPTABLE FOR SPUTUM CULTURE Performed at [facility]    Report Status 08/23/2016 FINAL  Final  Culture, respiratory (NON-Expectorated)     Status: None   Collection Time: 08/23/16  6:25 PM  Result Value Ref Range Status   Specimen Description EXPECTORATED SPUTUM  Final   Special Requests NONE Reflexed from [id number]  Final   Gram Stain   Final    FEW WBC PRESENT, PREDOMINANTLY PMN RARE YEAST Performed at [facility], [address] [place], [place] [address]    Culture FEW PSEUDOMONAS AERUGINOSA  Final   Report Status  08/26/2016 FINAL  Final   Organism ID, Bacteria PSEUDOMONAS AERUGINOSA  Final      Susceptibility   Pseudomonas aeruginosa - MIC*    CEFTAZIDIME 4 SENSITIVE Sensitive     CIPROFLOXACIN <=0.25 SENSITIVE Sensitive     GENTAMICIN 2 SENSITIVE Sensitive     IMIPENEM 2 SENSITIVE Sensitive     PIP/TAZO 8 SENSITIVE Sensitive     CEFEPIME 2 SENSITIVE Sensitive     * FEW PSEUDOMONAS AERUGINOSA     Time coordinating discharge: Over 30 minutes  SIGNED:   Loretha Stapler, MD  [facility] 08/28/2016, 12:38 PM Pager [phone] If 7PM-7AM, please contact night-coverage [email or web address] Password [email or web address]

## 2016-08-28 NOTE — Progress Notes (Signed)
Daily Progress Note   Patient Name: Leah Dalton       Date: 08/28/2016 DOB: 1945/12/20  Age: 71 y.o. MRN#: [id number] Attending Physician: No att. providers found Primary Care Physician: Crisoforo Oxford, MD Admit Date: 08/21/2016  Reason for Consultation/Follow-up: Establishing goals of care and Psychosocial/spiritual support  Subjective: Leah Dalton is sitting quietly in bed. She greets me making and keeping eye contact as I enter. She is, as usual pleasant and cooperative. No family at bedside at this time. We talk about her need for up to 4 weeks of IV antibiotics at the [facility] [place]. I share that she will be able to receive antibiotics in place through her PIC line.  We talk about increased risks for yeast on the skin, and CDiff. No questions at this time.   We talk about future choices, returning to the hospital. Also briefly discuss the benefits of hospice in place. Leah Dalton states that she is familiar with hospice. She she tells a story of her father's benefit from hospice services.  Leah Dalton states that she relies on her daughter-in-law to help with these choices, her daughter-in-law's mother lives in the same facility.   Length of Stay: 7  Current Medications: Scheduled Meds:  . enoxaparin (LOVENOX) injection  40 mg Subcutaneous Q24H  . fluticasone furoate-vilanterol  1 puff Inhalation Daily  . guaiFENesin  30 mL Oral Q6H  . lidocaine  1 patch Transdermal Q24H  . mirtazapine  15 mg Oral QHS  . pantoprazole sodium  40 mg Per Tube Daily  . predniSONE  10 mg Oral Q breakfast    Continuous Infusions: . ceFEPime (MAXIPIME) IV Stopped (08/28/16 1320)    PRN Meds: acetaminophen, diphenhydrAMINE, ipratropium-albuterol, traZODone  Physical Exam  Constitutional:  She is oriented to person, place, and time. No distress.  Frail, chronically/acutely ill appearing  HENT:  Head: Atraumatic.  Some temporal wasting  Cardiovascular: Normal rate.   Pulmonary/Chest:  Able to have a conversation without shortness of breath, ongoing productive cough  Abdominal: Soft. She exhibits no distension.  Musculoskeletal: She exhibits edema.  Muscle weakness, wheelchair/bedbound at the state  Neurological: She is alert and oriented to person, place, and time.  Skin: Skin is warm and dry.  Nursing note and vitals reviewed.  Vital Signs: BP (!) 149/76 (BP Location: Left Arm)   Pulse (!) 120 Comment: seated EOB, remains elevated x55minutes, suspected orthostatic response.   Temp 97.7 F (36.5 C) (Oral)   Resp 20   Ht 5\' 4"  (1.626 m)   Wt 61.2 kg (134 lb 14.4 oz)   SpO2 96%   BMI 23.16 kg/m  SpO2: SpO2: 96 % O2 Device: O2 Device: Nasal Cannula O2 Flow Rate: O2 Flow Rate (L/min): 3.5 L/min  Intake/output summary:  Intake/Output Summary (Last 24 hours) at 08/28/16 1514 Last data filed at 08/28/16 0944  Gross per 24 hour  Intake              680 ml  Output              600 ml  Net               80 ml   LBM: Last BM Date: 08/25/16 Baseline Weight: Weight: 60.8 kg (134 lb) Most recent weight: Weight: 61.2 kg (134 lb 14.4 oz)       Palliative Assessment/Data:    Flowsheet Rows     Most Recent Value  Intake Tab  Referral Department  Hospitalist  Unit at Time of Referral  Med/Surg Unit  Palliative Care Primary Diagnosis  Pulmonary  Date Notified  08/24/16  Palliative Care Type  New Palliative care  Reason for referral  Clarify Goals of Care, Advance Care Planning  Date of Admission  08/21/16  Date first seen by Palliative Care  08/24/16  # of days Palliative referral response time  0 Day(s)  # of days IP prior to Palliative referral  3  Clinical Assessment  Palliative Performance Scale Score  30%  Pain Max last 24 hours  Not able to report   Pain Min Last 24 hours  Not able to report  Dyspnea Max Last 24 Hours  Not able to report  Dyspnea Min Last 24 hours  Not able to report  Psychosocial & Spiritual Assessment  Palliative Care Outcomes  Patient/Family meeting held?  Yes  Who was at the meeting?  Patient only today  Palliative Care Outcomes  Provided advance care planning, Provided psychosocial or spiritual support, Clarified goals of care      Patient Active Problem List   Diagnosis Date Noted  . Palliative care encounter   . Goals of care, counseling/discussion   . DNR (do not resuscitate) discussion   . Hypoalbuminemia   . Thrombocytosis ([place]) 08/21/2016  . Leukocytosis 08/21/2016  . Positive D dimer 08/21/2016  . HCAP (healthcare-associated pneumonia) 08/21/2016  . Pneumonia of right middle lobe due to infectious organism ([place])   . Acute hypokalemia 02/20/2016  . 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 AVM (arteriovenous malformation) of colon 04/10/2014  . IDA (iron deficiency anemia) 04/10/2014  . External hemorrhoids 02/24/2014  . Reflux esophagitis   . COPD (chronic obstructive pulmonary disease) ([place]) 06/07/2012  . Acute-on-chronic respiratory failure ([place]) 06/07/2012  . Polycythemia 06/07/2012  . Hyponatremia 06/07/2012    Palliative Care Assessment & Plan   Patient Profile: 71 y.o.femalewith past medical history of AVmalformation of colon, COPD oxygen dependent since March 2014, esophagitis, GERD, history of tobacco abuse, osteoporosis, polycythemia, bedbound status since January 2018, SNF resident since December 2017admitted on 5/28/2018with H CAP, COPD with chronic hypercarbia respiratory failure, steroid dependent, failure to thrive as evidenced by albumin of 1.7, frailty..  Assessment: End stage COPD; Leah Dalton is now oxygen dependent, she is had several exacerbations of her COPD  this year. She is next out with medical treatment. She has elected no intubation if she were to decline. She has been  diagnosed with a cavitary pneumonia with Pseudomonas. She will require approximately 4 weeks of IV antibiotics at her facility. Frailty, failure to thrive; Leah Dalton is taking nutritional supplements at her residential SNF, but her albumin remains low at 1.7 this hospitalization. She has been bedbound since January 2018, but continues with physical therapy treatments. She states that she wonders what shall be able to do after this hospitalization.  Recommendations/Plan:  continue to treat the treatable but no extraordinary measures such as CPR or intubation. Return to [facility] [place] with the goal of stabilization.  Goals of Care and Additional Recommendations:  Limitations on Scope of Treatment: Treat the treatable but no CPR or intubation  Code Status:    Code Status Orders        Start     Ordered   08/25/16 1321  Do not attempt resuscitation (DNR)  Continuous    Question Answer Comment  In the event of cardiac or respiratory ARREST Do not call a "code blue"   In the event of cardiac or respiratory ARREST Do not perform Intubation, CPR, defibrillation or ACLS   In the event of cardiac or respiratory ARREST Use medication by any route, position, wound care, and other measures to relive pain and suffering. May use oxygen, suction and manual treatment of airway obstruction as needed for comfort.      08/25/16 1320    Code Status History    Date Active Date Inactive Code Status Order ID Comments User Context   08/21/2016  6:26 PM 08/25/2016  1:20 PM Full Code [id number]  Vianne Bulls, MD ED   03/09/2016  5:22 PM 03/15/2016  4:41 PM Full Code [id number]  Sinda Du, MD Inpatient   02/20/2016  3:02 PM 02/26/2016  2:31 PM Full Code [id number]  Orvan Falconer, MD Inpatient   02/22/2014 11:14 AM 02/24/2014  3:13 PM Full Code [id number]  Donne Hazel, MD ED   06/07/2012  4:34 PM 06/10/2012  7:58 PM Full Code [id number]  Rexene Alberts, MD ED       Prognosis:   < 6 months or less  would not be surprising based on 3 hospitalizations in the last month, frailty, albumin of 1.7, And cavitary pneumonia with Pseudomonas.   Discharge Planning:  Return to [name] [name] with the goal of stabilization  Care plan was discussed with nursing staff, case manager, social worker, and Dr. Adair Patter on next rounds.   Thank you for allowing the Palliative Medicine Team to assist in the care of this patient.   Time In: 1040 Time Out: 1100 Total Time 20 minutes Prolonged Time Billed  no       Greater than 50%  of this time was spent counseling and coordinating care related to the above assessment and plan.  Drue Novel, NP  Please contact Palliative Medicine Team phone at [phone] for questions and concerns.

## 2016-08-28 NOTE — Progress Notes (Signed)
Subjective: She says she feels okay. She is still coughing occasionally productively. She still has some chest discomfort when she coughs. No other new complaints. Her breathing is better. She's not having any abdominal pain. She still has swelling of her feet  Objective: Vital signs in last 24 hours: Temp:  [97.7 F (36.5 C)-98.4 F (36.9 C)] 97.7 F (36.5 C) (06/04 0536) Pulse Rate:  [97-107] 97 (06/04 0536) Resp:  [20] 20 (06/04 0536) BP: (142-156)/(76-82) 149/76 (06/04 0536) SpO2:  [100 %] 100 % (06/04 0536) Weight change:  Last BM Date: 08/25/16  Intake/Output from previous day: 06/03 0701 - 06/04 0700 In: 920 [P.O.:720; IV Piggyback:200] Out: 850 [Urine:850]  PHYSICAL EXAM General appearance: alert, cooperative and no distress Resp: rhonchi bilaterally Cardio: regular rate and rhythm, S1, S2 normal, no murmur, click, rub or gallop GI: soft, non-tender; bowel sounds normal; no masses,  no organomegaly Extremities: She still has significant edema of her legs and feet and some in the dependent areas of her arms EOMI. Pupils react  Lab Results:  Results for orders placed or performed during the hospital encounter of 08/21/16 (from the past 48 hour(s))  CBC with Differential/Platelet     Status: Abnormal   Collection Time: 08/27/16  7:32 AM  Result Value Ref Range   WBC 9.5 4.0 - 10.5 K/uL   RBC 2.85 (L) 3.87 - 5.11 MIL/uL   Hemoglobin 8.2 (L) 12.0 - 15.0 g/dL   HCT 25.5 (L) 36.0 - 46.0 %   MCV 89.5 78.0 - 100.0 fL   MCH 28.8 26.0 - 34.0 pg   MCHC 32.2 30.0 - 36.0 g/dL   RDW 17.1 (H) 11.5 - 15.5 %   Platelets 729 (H) 150 - 400 K/uL   Neutrophils Relative % 74 %   Lymphocytes Relative 21 %   Monocytes Relative 1 %   Eosinophils Relative 0 %   Basophils Relative 0 %   Band Neutrophils 0 %   Metamyelocytes Relative 0 %   Myelocytes 4 %   Promyelocytes Absolute 0 %   Blasts 0 %   nRBC 0 0 /100 WBC   Other 0 %   Neutro Abs 7.4 1.7 - 7.7 K/uL   Lymphs Abs 2.0 0.7 -  4.0 K/uL   Monocytes Absolute 0.1 0.1 - 1.0 K/uL   Eosinophils Absolute 0.0 0.0 - 0.7 K/uL   Basophils Absolute 0.0 0.0 - 0.1 K/uL   WBC Morphology MILD LEFT SHIFT (1-5% METAS, OCC MYELO, OCC BANDS)   Basic metabolic panel     Status: Abnormal   Collection Time: 08/27/16  7:32 AM  Result Value Ref Range   Sodium 137 135 - 145 mmol/L   Potassium 3.6 3.5 - 5.1 mmol/L   Chloride 97 (L) 101 - 111 mmol/L   CO2 35 (H) 22 - 32 mmol/L   Glucose, Bld 73 65 - 99 mg/dL   BUN 7 6 - 20 mg/dL   Creatinine, Ser <0.30 (L) 0.44 - 1.00 mg/dL   Calcium 8.4 (L) 8.9 - 10.3 mg/dL   GFR calc non Af Amer NOT CALCULATED >60 mL/min   GFR calc Af Amer NOT CALCULATED >60 mL/min    Comment: (NOTE) The eGFR has been calculated using the CKD EPI equation. This calculation has not been validated in all clinical situations. eGFR's persistently <60 mL/min signify possible Chronic Kidney Disease.    Anion gap 5 5 - 15  Basic metabolic panel     Status: Abnormal   Collection Time:  08/28/16  4:28 AM  Result Value Ref Range   Sodium 143 135 - 145 mmol/L   Potassium 3.6 3.5 - 5.1 mmol/L   Chloride 104 101 - 111 mmol/L   CO2 33 (H) 22 - 32 mmol/L   Glucose, Bld 80 65 - 99 mg/dL   BUN 9 6 - 20 mg/dL   Creatinine, Ser <0.30 (L) 0.44 - 1.00 mg/dL   Calcium 8.8 (L) 8.9 - 10.3 mg/dL   GFR calc non Af Amer NOT CALCULATED >60 mL/min   GFR calc Af Amer NOT CALCULATED >60 mL/min    Comment: (NOTE) The eGFR has been calculated using the CKD EPI equation. This calculation has not been validated in all clinical situations. eGFR's persistently <60 mL/min signify possible Chronic Kidney Disease.    Anion gap 6 5 - 15    ABGS No results for input(s): PHART, PO2ART, TCO2, HCO3 in the last 72 hours.  Invalid input(s): PCO2 CULTURES Recent Results (from the past 240 hour(s))  Blood culture (routine x 2)     Status: None   Collection Time: 08/21/16  5:19 PM  Result Value Ref Range Status   Specimen Description BLOOD  LEFT ARM  Final   Special Requests   Final    BOTTLES DRAWN AEROBIC ONLY Blood Culture results may not be optimal due to an inadequate volume of blood received in culture bottles   Culture NO GROWTH 5 DAYS  Final   Report Status 08/26/2016 FINAL  Final  Blood culture (routine x 2)     Status: None   Collection Time: 08/21/16  5:19 PM  Result Value Ref Range Status   Specimen Description LEFT ANTECUBITAL  Final   Special Requests   Final    BOTTLES DRAWN AEROBIC ONLY Blood Culture results may not be optimal due to an inadequate volume of blood received in culture bottles   Culture NO GROWTH 5 DAYS  Final   Report Status 08/26/2016 FINAL  Final  MRSA PCR Screening     Status: None   Collection Time: 08/22/16  1:40 AM  Result Value Ref Range Status   MRSA by PCR NEGATIVE NEGATIVE Final    Comment:        The GeneXpert MRSA Assay (FDA approved for NASAL specimens only), is one component of a comprehensive MRSA colonization surveillance program. It is not intended to diagnose MRSA infection nor to guide or monitor treatment for MRSA infections.   Culture, sputum-assessment     Status: None   Collection Time: 08/23/16  6:25 PM  Result Value Ref Range Status   Specimen Description EXPECTORATED SPUTUM  Final   Special Requests NONE  Final   Sputum evaluation   Final    THIS SPECIMEN IS ACCEPTABLE FOR SPUTUM CULTURE Performed at [facility]    Report Status 08/23/2016 FINAL  Final  Culture, respiratory (NON-Expectorated)     Status: None   Collection Time: 08/23/16  6:25 PM  Result Value Ref Range Status   Specimen Description EXPECTORATED SPUTUM  Final   Special Requests NONE Reflexed from [id number]  Final   Gram Stain   Final    FEW WBC PRESENT, PREDOMINANTLY PMN RARE YEAST Performed at [facility], [address], [place], [place] [address]    Culture FEW PSEUDOMONAS AERUGINOSA  Final   Report Status 08/26/2016 FINAL  Final   Organism ID, Bacteria PSEUDOMONAS  AERUGINOSA  Final      Susceptibility   Pseudomonas aeruginosa - MIC*  CEFTAZIDIME 4 SENSITIVE Sensitive     CIPROFLOXACIN <=0.25 SENSITIVE Sensitive     GENTAMICIN 2 SENSITIVE Sensitive     IMIPENEM 2 SENSITIVE Sensitive     PIP/TAZO 8 SENSITIVE Sensitive     CEFEPIME 2 SENSITIVE Sensitive     * FEW PSEUDOMONAS AERUGINOSA   Studies/Results: No results found.  Medications:  Prior to Admission:  Prescriptions Prior to Admission  Medication Sig Dispense Refill Last Dose  . acetaminophen (TYLENOL) 325 MG tablet Take 650 mg by mouth every 6 (six) hours as needed for moderate pain.   08/20/2016 at Unknown time  . Amino Acids-Protein Hydrolys (FEEDING SUPPLEMENT, PRO-STAT 64,) LIQD Take 30 mLs by mouth 2 (two) times daily.   08/21/2016 at Unknown time  . amLODipine (NORVASC) 5 MG tablet Take 5 mg by mouth daily.   08/21/2016 at Unknown time  . aspirin EC 325 MG tablet Take 325 mg by mouth daily.   08/21/2016 at Unknown time  . Cholecalciferol (VITAMIN D-3) 1000 units CAPS Take 1 tablet by mouth daily.   08/21/2016 at Unknown time  . demeclocycline (DECLOMYCIN) 150 MG tablet Take 150 mg by mouth 2 (two) times daily.   08/21/2016 at Unknown time  . docusate sodium (COLACE) 100 MG capsule Take 100 mg by mouth 2 (two) times daily.   08/21/2016 at Unknown time  . ferrous sulfate 300 (60 Fe) MG/5ML syrup Take 300 mg by mouth daily.   08/21/2016 at Unknown time  . fluticasone furoate-vilanterol (BREO ELLIPTA) 200-25 MCG/INH AEPB Inhale 1 puff into the lungs daily.   08/21/2016 at Unknown time  . furosemide (LASIX) 40 MG tablet Take 40 mg by mouth daily.    08/21/2016 at Unknown time  . guaiFENesin-dextromethorphan (ROBITUSSIN DM) 100-10 MG/5ML syrup Take 10 mLs by mouth every 4 (four) hours as needed for cough.   08/20/2016 at Unknown time  . insulin aspart (NOVOLOG) 100 UNIT/ML injection Inject 0-10 Units into the skin 2 (two) times daily. Sliding Scale:: 60-200= 0 units 201-250= 2 units 251-300= 4  units 301-350= 6 units 351-400= 8 units 401-450=10units   08/21/2016 at Unknown time  . ipratropium-albuterol (DUONEB) 0.5-2.5 (3) MG/3ML SOLN Take 3 mLs by nebulization every 6 (six) hours.   08/21/2016 at Unknown time  . mirtazapine (REMERON) 15 MG tablet Take 15 mg by mouth at bedtime.   08/20/2016 at Unknown time  . omeprazole (PRILOSEC) 20 MG capsule Take 20 mg by mouth daily.   08/21/2016 at Unknown time  . ondansetron (ZOFRAN) 4 MG tablet Take 4 mg by mouth every 8 (eight) hours as needed for nausea or vomiting.   Past Week at Unknown time  . OXYGEN Inhale 3 L into the lungs daily. During day and evening shifts   08/21/2016 at Unknown time  . potassium chloride (KLOR-CON) 20 MEQ packet Take 20 mEq by mouth daily.   08/21/2016 at Unknown time  . predniSONE (DELTASONE) 10 MG tablet Take 10 mg by mouth daily with breakfast.   08/21/2016 at Unknown time  . sodium chloride (OCEAN) 0.65 % SOLN nasal spray Place 1 spray into both nostrils 3 (three) times daily.   08/21/2016 at Unknown time  . vitamin C (ASCORBIC ACID) 500 MG tablet Take 500 mg by mouth daily.   08/21/2016 at Unknown time   Scheduled: . enoxaparin (LOVENOX) injection  40 mg Subcutaneous Q24H  . fluticasone furoate-vilanterol  1 puff Inhalation Daily  . guaiFENesin  30 mL Oral Q6H  . lidocaine  1 patch Transdermal  Q24H  . mirtazapine  15 mg Oral QHS  . pantoprazole sodium  40 mg Per Tube Daily  . predniSONE  10 mg Oral Q breakfast   Continuous: . ceFEPime (MAXIPIME) IV Stopped (08/28/16 5749)   TXL:[id number], diphenhydrAMINE, ipratropium-albuterol, traZODone  Assesment:She is here with healthcare associated pneumonia. This is cavitary by CT. She has multiple areas and there is concern that this may be from septic emboli. She is growing Pseudomonas in her sputum which will require prolonged antibiotics. TEE has been recommended to make sure she doesn't have vegetations considering the worry about septic emboli. She has  significant malnutrition and I think that's causing her edema Principal Problem:   HCAP (healthcare-associated pneumonia) Active Problems:   COPD (chronic obstructive pulmonary disease) ([facility])   Acute-on-chronic respiratory failure ([facility])   Reflux esophagitis   IDA (iron deficiency anemia)   Acute hypokalemia   Thrombocytosis ([facility])   Leukocytosis   Positive D dimer   Pneumonia of right middle lobe due to infectious organism [facility])   Palliative care encounter   Goals of care, counseling/discussion   DNR (do not resuscitate) discussion   Hypoalbuminemia    Plan: No change in treatments. Cardiology has been consulted regarding TEE    LOS: 7 days   Mairyn Lenahan L 08/28/2016, 8:13 AM

## 2016-08-28 NOTE — Progress Notes (Signed)
Put in order for patient to be NPO until cardiology sees this AM, in case of TEE today.

## 2016-08-28 NOTE — Care Management Important Message (Signed)
Important Message  Patient Details  Name: Leah Dalton MRN: [id number] Date of Birth: 05/23/1945   Medicare Important Message Given:  Yes    Sherald Barge, RN 08/28/2016, 12:41 PM

## 2016-08-28 NOTE — Progress Notes (Signed)
Called report to [facility] nurse. Waiting for EMS to pick up patient and transport.  Patient is being discharged with a PICC line to continue IV antibiotics for about 6 weeks.

## 2016-08-28 NOTE — Progress Notes (Addendum)
LCSW following for disposition:  Plan to return today to [facility] [place].  Facility requested MAR hx and LCSW was able to send through the HUB requested medications. Will send DC summary and remaining clinicals to facility once available. Patient will transport by EMS. LCSW will update family once DC is finalized. Son and daughter in law in room, aware of dc plan. No barriers with return.  No other needs at this time. Report Number:  [phone]   Lane Hacker, MSW  Clinical Social Work: System Wide Float Coverage for :  [phone]

## 2016-08-28 NOTE — Evaluation (Signed)
Physical Therapy Evaluation Patient Details Name: Leah Dalton MRN: [id number] DOB: Jan 10, 1946 Today's Date: 08/28/2016   History of Present Illness  Leah Dalton is a 71yo white female who comes to [facility] from [facility] on 5/28 after worsening cought and Rt pleuritic pain. PMH: COPD, polycythemia, LEE. Since admitted pt noted to be hypoxic and hypokalemic, (+) d-Dimer test with chest CT suspicious for multifocal septic emboli. Day of PT eval, recent labs showing Hb: 8.2, HCT: 25.5, lytes WNL. Pt has been working with rehab at [facility], with improved strength but has been unable to maintain standing independnently.   Clinical Impression  Pt admitted with above diagnosis. Pt currently with functional limitations due to the deficits listed below (see "PT Problem List"). Upon entry, the patient is received semirecumbent in bed, no family/caregiver present.   The pt is awake and agreeable to participate. No acute distress noted at this time, pt only reporting some mild pain in chest, improved since admission. SaO2 stable during evaluation of functional mobility, however with increasing tachycardia with upright posture, 120s seated EOB. Visible pedal edema with thinning of skill, and stage 3 pitting edema to the level of the fibular head bilat. Pt reports this to be chronic and improved since admission. Heels cannot be visualized due to prophylactic dressing.The pt is alert and oriented x3, pleasant, conversational, and following simple and multi-step commands consistently. Pt received on and remaining on 3.5L O2 throughout evaluation (3L is baseline), with noted saturation of >89%, throughout. Functional mobility assessment demonstrates severe weakness, the pt now requiring TotalA physical assistance for bed mobility, the patient reports she feels mildly more weak than her baseline. Pt will benefit from skilled PT intervention to increase independence and safety with basic mobility in  preparation for discharge to the venue listed below.       Follow Up Recommendations SNF    Equipment Recommendations  None recommended by PT    Recommendations for Other Services       Precautions / Restrictions Precautions Precautions: Fall Restrictions Weight Bearing Restrictions: No      Mobility  Bed Mobility Overal bed mobility: Needs Assistance Bed Mobility: Supine to Sit;Sit to Supine (Scooting)     Supine to sit: HOB elevated;Mod assist (heavy assist required to mobility BLE. ) Sit to supine: HOB elevated;Max assist (heavy assist required to mobility BLE.)   General bed mobility comments: Scooting toward [facility] requires TotalA +2. Tachycardia (120s) increases with sitting EOB, likely orthstatic, pt dizzy. SaO2: 96% on 3.5L.   Transfers                 General transfer comment: Not attempted to due to tachycardia with sitting.   Ambulation/Gait                Stairs            Wheelchair Mobility    Modified Rankin (Stroke Patients Only)       Balance                                             Pertinent Vitals/Pain Pain Assessment: Faces Faces Pain Scale: Hurts little more Pain Location: Left chest, mild improvement since admitted  Pain Intervention(s): Limited activity within patient's tolerance;Monitored during session;Premedicated before session;Repositioned    Home Living Family/patient expects to be discharged to:: Skilled nursing facility  Prior Function Level of Independence: Needs assistance   Gait / Transfers Assistance Needed: Mod-MaxA for transfers to [facility]; can perform some self-propelled WC mobility.   ADL's / Homemaking Assistance Needed: Total assistance for toiletting, bathing, dressing; can feed self   Comments: Has not been able to AMB or stand independently x6M.      Hand Dominance        Extremity/Trunk Assessment   Upper Extremity Assessment Upper  Extremity Assessment: Generalized weakness;Overall WFL for tasks assessed    Lower Extremity Assessment Lower Extremity Assessment: Generalized weakness    Cervical / Trunk Assessment Cervical / Trunk Assessment:  (very weak)  Communication   Communication: No difficulties  Cognition Arousal/Alertness: Awake/alert Behavior During Therapy: WFL for tasks assessed/performed Overall Cognitive Status: Within Functional Limits for tasks assessed                                        General Comments      Exercises     Assessment/Plan    PT Assessment Patient needs continued PT services  PT Problem List Decreased strength;Decreased activity tolerance;Decreased range of motion;Decreased mobility;Decreased safety awareness;Pain;Cardiopulmonary status limiting activity       PT Treatment Interventions Gait training;DME instruction;Functional mobility training;Therapeutic activities;Therapeutic exercise    PT Goals (Current goals can be found in the Care Plan section)  Acute Rehab PT Goals Patient Stated Goal: improve strength and ability to stand/AMb PT Goal Formulation: With patient Time For Goal Achievement: 09/11/16 Potential to Achieve Goals: Fair    Frequency Min 2X/week   Barriers to discharge        Co-evaluation               AM-PAC PT "6 Clicks" Daily Activity  Outcome Measure Difficulty turning over in bed (including adjusting bedclothes, sheets and blankets)?: Total Difficulty moving from lying on back to sitting on the side of the bed? : Total Difficulty sitting down on and standing up from a chair with arms (e.g., wheelchair, bedside commode, etc,.)?: Total Help needed moving to and from a bed to chair (including a wheelchair)?: Total Help needed walking in hospital room?: Total Help needed climbing 3-5 steps with a railing? : Total 6 Click Score: 6    End of Session Equipment Utilized During Treatment: Oxygen Activity Tolerance:  Patient limited by fatigue;Patient limited by pain Patient left: in bed;with call bell/phone within reach;with bed alarm set;Other (comment) (feel elevated on pillow, heels floating) Nurse Communication: Mobility status PT Visit Diagnosis: Unsteadiness on feet (R26.81);Muscle weakness (generalized) (M62.81)    Time: 3149-7026 PT Time Calculation (min) (ACUTE ONLY): 16 min   Charges:   PT Evaluation $PT Eval Moderate Complexity: 1 Procedure PT Treatments $Therapeutic Activity: 8-22 mins   PT G Codes:       12:35 PM, 06-Sep-2016 Etta Grandchild, PT, DPT Physical Therapist - [place] [phone] (Office)    Krystyna Cleckley C 09-06-16, 12:30 PM

## 2016-08-29 DIAGNOSIS — S51801D Unspecified open wound of right forearm, subsequent encounter: Secondary | ICD-10-CM | POA: Diagnosis not present

## 2016-08-29 DIAGNOSIS — R05 Cough: Secondary | ICD-10-CM | POA: Diagnosis not present

## 2016-08-29 DIAGNOSIS — E876 Hypokalemia: Secondary | ICD-10-CM | POA: Diagnosis not present

## 2016-08-29 DIAGNOSIS — R54 Age-related physical debility: Secondary | ICD-10-CM | POA: Diagnosis not present

## 2016-08-29 DIAGNOSIS — K219 Gastro-esophageal reflux disease without esophagitis: Secondary | ICD-10-CM | POA: Diagnosis not present

## 2016-08-29 DIAGNOSIS — J449 Chronic obstructive pulmonary disease, unspecified: Secondary | ICD-10-CM | POA: Diagnosis not present

## 2016-08-29 DIAGNOSIS — R739 Hyperglycemia, unspecified: Secondary | ICD-10-CM | POA: Diagnosis not present

## 2016-08-29 DIAGNOSIS — I10 Essential (primary) hypertension: Secondary | ICD-10-CM | POA: Diagnosis not present

## 2016-08-29 DIAGNOSIS — M810 Age-related osteoporosis without current pathological fracture: Secondary | ICD-10-CM | POA: Diagnosis not present

## 2016-08-29 DIAGNOSIS — R600 Localized edema: Secondary | ICD-10-CM | POA: Diagnosis not present

## 2016-08-29 DIAGNOSIS — D649 Anemia, unspecified: Secondary | ICD-10-CM | POA: Diagnosis not present

## 2016-08-29 DIAGNOSIS — S81801D Unspecified open wound, right lower leg, subsequent encounter: Secondary | ICD-10-CM | POA: Diagnosis not present

## 2016-09-01 DIAGNOSIS — E876 Hypokalemia: Secondary | ICD-10-CM | POA: Diagnosis not present

## 2016-09-01 DIAGNOSIS — R54 Age-related physical debility: Secondary | ICD-10-CM | POA: Diagnosis not present

## 2016-09-01 DIAGNOSIS — D649 Anemia, unspecified: Secondary | ICD-10-CM | POA: Diagnosis not present

## 2016-09-01 DIAGNOSIS — R600 Localized edema: Secondary | ICD-10-CM | POA: Diagnosis not present

## 2016-09-01 DIAGNOSIS — I330 Acute and subacute infective endocarditis: Secondary | ICD-10-CM | POA: Diagnosis not present

## 2016-09-01 DIAGNOSIS — M6281 Muscle weakness (generalized): Secondary | ICD-10-CM | POA: Diagnosis not present

## 2016-09-01 DIAGNOSIS — I10 Essential (primary) hypertension: Secondary | ICD-10-CM | POA: Diagnosis not present

## 2016-09-01 DIAGNOSIS — R739 Hyperglycemia, unspecified: Secondary | ICD-10-CM | POA: Diagnosis not present

## 2016-09-01 DIAGNOSIS — E871 Hypo-osmolality and hyponatremia: Secondary | ICD-10-CM | POA: Diagnosis not present

## 2016-09-01 DIAGNOSIS — J181 Lobar pneumonia, unspecified organism: Secondary | ICD-10-CM | POA: Diagnosis not present

## 2016-09-01 DIAGNOSIS — K219 Gastro-esophageal reflux disease without esophagitis: Secondary | ICD-10-CM | POA: Diagnosis not present

## 2016-09-01 DIAGNOSIS — J449 Chronic obstructive pulmonary disease, unspecified: Secondary | ICD-10-CM | POA: Diagnosis not present

## 2016-09-06 DIAGNOSIS — R54 Age-related physical debility: Secondary | ICD-10-CM | POA: Diagnosis not present

## 2016-09-06 DIAGNOSIS — R600 Localized edema: Secondary | ICD-10-CM | POA: Diagnosis not present

## 2016-09-06 DIAGNOSIS — R739 Hyperglycemia, unspecified: Secondary | ICD-10-CM | POA: Diagnosis not present

## 2016-09-06 DIAGNOSIS — M6281 Muscle weakness (generalized): Secondary | ICD-10-CM | POA: Diagnosis not present

## 2016-09-06 DIAGNOSIS — I330 Acute and subacute infective endocarditis: Secondary | ICD-10-CM | POA: Diagnosis not present

## 2016-09-06 DIAGNOSIS — K219 Gastro-esophageal reflux disease without esophagitis: Secondary | ICD-10-CM | POA: Diagnosis not present

## 2016-09-06 DIAGNOSIS — I10 Essential (primary) hypertension: Secondary | ICD-10-CM | POA: Diagnosis not present

## 2016-09-06 DIAGNOSIS — D649 Anemia, unspecified: Secondary | ICD-10-CM | POA: Diagnosis not present

## 2016-09-06 DIAGNOSIS — E871 Hypo-osmolality and hyponatremia: Secondary | ICD-10-CM | POA: Diagnosis not present

## 2016-09-06 DIAGNOSIS — E876 Hypokalemia: Secondary | ICD-10-CM | POA: Diagnosis not present

## 2016-09-06 DIAGNOSIS — J181 Lobar pneumonia, unspecified organism: Secondary | ICD-10-CM | POA: Diagnosis not present

## 2016-09-06 DIAGNOSIS — J449 Chronic obstructive pulmonary disease, unspecified: Secondary | ICD-10-CM | POA: Diagnosis not present

## 2016-09-13 ENCOUNTER — Encounter (HOSPITAL_COMMUNITY): Payer: Self-pay | Admitting: Cardiology

## 2016-09-13 ENCOUNTER — Inpatient Hospital Stay (HOSPITAL_COMMUNITY)
Admission: EM | Admit: 2016-09-13 | Discharge: 2016-09-24 | DRG: 682 | Disposition: E | Payer: Medicare Other | Attending: Internal Medicine | Admitting: Internal Medicine

## 2016-09-13 ENCOUNTER — Emergency Department (HOSPITAL_COMMUNITY): Payer: Medicare Other

## 2016-09-13 DIAGNOSIS — D72829 Elevated white blood cell count, unspecified: Secondary | ICD-10-CM | POA: Diagnosis present

## 2016-09-13 DIAGNOSIS — Z87891 Personal history of nicotine dependence: Secondary | ICD-10-CM

## 2016-09-13 DIAGNOSIS — N179 Acute kidney failure, unspecified: Secondary | ICD-10-CM | POA: Diagnosis present

## 2016-09-13 DIAGNOSIS — Z7401 Bed confinement status: Secondary | ICD-10-CM

## 2016-09-13 DIAGNOSIS — I10 Essential (primary) hypertension: Secondary | ICD-10-CM | POA: Diagnosis present

## 2016-09-13 DIAGNOSIS — R739 Hyperglycemia, unspecified: Secondary | ICD-10-CM | POA: Diagnosis present

## 2016-09-13 DIAGNOSIS — E8809 Other disorders of plasma-protein metabolism, not elsewhere classified: Secondary | ICD-10-CM | POA: Diagnosis present

## 2016-09-13 DIAGNOSIS — R32 Unspecified urinary incontinence: Secondary | ICD-10-CM | POA: Diagnosis present

## 2016-09-13 DIAGNOSIS — Z515 Encounter for palliative care: Secondary | ICD-10-CM | POA: Diagnosis present

## 2016-09-13 DIAGNOSIS — E872 Acidosis: Secondary | ICD-10-CM | POA: Diagnosis present

## 2016-09-13 DIAGNOSIS — R0602 Shortness of breath: Secondary | ICD-10-CM | POA: Diagnosis not present

## 2016-09-13 DIAGNOSIS — D473 Essential (hemorrhagic) thrombocythemia: Secondary | ICD-10-CM

## 2016-09-13 DIAGNOSIS — D75839 Thrombocytosis, unspecified: Secondary | ICD-10-CM | POA: Diagnosis present

## 2016-09-13 DIAGNOSIS — D649 Anemia, unspecified: Secondary | ICD-10-CM | POA: Diagnosis present

## 2016-09-13 DIAGNOSIS — D751 Secondary polycythemia: Secondary | ICD-10-CM | POA: Diagnosis present

## 2016-09-13 DIAGNOSIS — J449 Chronic obstructive pulmonary disease, unspecified: Secondary | ICD-10-CM | POA: Diagnosis present

## 2016-09-13 DIAGNOSIS — Z79899 Other long term (current) drug therapy: Secondary | ICD-10-CM

## 2016-09-13 DIAGNOSIS — Z7952 Long term (current) use of systemic steroids: Secondary | ICD-10-CM

## 2016-09-13 DIAGNOSIS — Z7951 Long term (current) use of inhaled steroids: Secondary | ICD-10-CM

## 2016-09-13 DIAGNOSIS — E43 Unspecified severe protein-calorie malnutrition: Secondary | ICD-10-CM | POA: Diagnosis not present

## 2016-09-13 DIAGNOSIS — Z825 Family history of asthma and other chronic lower respiratory diseases: Secondary | ICD-10-CM

## 2016-09-13 DIAGNOSIS — E871 Hypo-osmolality and hyponatremia: Secondary | ICD-10-CM | POA: Diagnosis not present

## 2016-09-13 DIAGNOSIS — E860 Dehydration: Secondary | ICD-10-CM | POA: Diagnosis present

## 2016-09-13 DIAGNOSIS — D638 Anemia in other chronic diseases classified elsewhere: Secondary | ICD-10-CM | POA: Diagnosis present

## 2016-09-13 DIAGNOSIS — N170 Acute kidney failure with tubular necrosis: Secondary | ICD-10-CM | POA: Diagnosis not present

## 2016-09-13 DIAGNOSIS — Z8701 Personal history of pneumonia (recurrent): Secondary | ICD-10-CM

## 2016-09-13 DIAGNOSIS — J189 Pneumonia, unspecified organism: Secondary | ICD-10-CM | POA: Diagnosis not present

## 2016-09-13 DIAGNOSIS — M810 Age-related osteoporosis without current pathological fracture: Secondary | ICD-10-CM | POA: Diagnosis present

## 2016-09-13 DIAGNOSIS — T501X5A Adverse effect of loop [high-ceiling] diuretics, initial encounter: Secondary | ICD-10-CM | POA: Diagnosis present

## 2016-09-13 DIAGNOSIS — Y95 Nosocomial condition: Secondary | ICD-10-CM | POA: Diagnosis present

## 2016-09-13 DIAGNOSIS — Z7982 Long term (current) use of aspirin: Secondary | ICD-10-CM

## 2016-09-13 DIAGNOSIS — R718 Other abnormality of red blood cells: Secondary | ICD-10-CM | POA: Diagnosis not present

## 2016-09-13 DIAGNOSIS — J9611 Chronic respiratory failure with hypoxia: Secondary | ICD-10-CM | POA: Diagnosis present

## 2016-09-13 DIAGNOSIS — J440 Chronic obstructive pulmonary disease with acute lower respiratory infection: Secondary | ICD-10-CM | POA: Diagnosis not present

## 2016-09-13 DIAGNOSIS — J9612 Chronic respiratory failure with hypercapnia: Secondary | ICD-10-CM | POA: Diagnosis present

## 2016-09-13 DIAGNOSIS — Z90710 Acquired absence of both cervix and uterus: Secondary | ICD-10-CM

## 2016-09-13 DIAGNOSIS — Z66 Do not resuscitate: Secondary | ICD-10-CM | POA: Diagnosis present

## 2016-09-13 DIAGNOSIS — Z794 Long term (current) use of insulin: Secondary | ICD-10-CM

## 2016-09-13 DIAGNOSIS — Z9981 Dependence on supplemental oxygen: Secondary | ICD-10-CM

## 2016-09-13 DIAGNOSIS — Z6823 Body mass index (BMI) 23.0-23.9, adult: Secondary | ICD-10-CM

## 2016-09-13 DIAGNOSIS — D509 Iron deficiency anemia, unspecified: Secondary | ICD-10-CM | POA: Diagnosis present

## 2016-09-13 DIAGNOSIS — K219 Gastro-esophageal reflux disease without esophagitis: Secondary | ICD-10-CM | POA: Diagnosis present

## 2016-09-13 HISTORY — DX: Pneumonia, unspecified organism: J18.9

## 2016-09-13 LAB — CBC WITH DIFFERENTIAL/PLATELET
Basophils Absolute: 0 10*3/uL (ref 0.0–0.1)
Basophils Relative: 0 %
EOS ABS: 0.2 10*3/uL (ref 0.0–0.7)
EOS PCT: 1 %
HCT: 25 % — ABNORMAL LOW (ref 36.0–46.0)
Hemoglobin: 8.1 g/dL — ABNORMAL LOW (ref 12.0–15.0)
LYMPHS ABS: 1.5 10*3/uL (ref 0.7–4.0)
LYMPHS PCT: 9 %
MCH: 29.6 pg (ref 26.0–34.0)
MCHC: 32.4 g/dL (ref 30.0–36.0)
MCV: 91.2 fL (ref 78.0–100.0)
MONO ABS: 1.3 10*3/uL — AB (ref 0.1–1.0)
Monocytes Relative: 7 %
Neutro Abs: 14.6 10*3/uL — ABNORMAL HIGH (ref 1.7–7.7)
Neutrophils Relative %: 83 %
PLATELETS: 471 10*3/uL — AB (ref 150–400)
RBC: 2.74 MIL/uL — AB (ref 3.87–5.11)
RDW: 19.2 % — AB (ref 11.5–15.5)
WBC: 17.6 10*3/uL — ABNORMAL HIGH (ref 4.0–10.5)

## 2016-09-13 LAB — COMPREHENSIVE METABOLIC PANEL
ALT: 17 U/L (ref 14–54)
ANION GAP: 7 (ref 5–15)
AST: 15 U/L (ref 15–41)
Albumin: 2 g/dL — ABNORMAL LOW (ref 3.5–5.0)
Alkaline Phosphatase: 72 U/L (ref 38–126)
BUN: 61 mg/dL — ABNORMAL HIGH (ref 6–20)
CHLORIDE: 105 mmol/L (ref 101–111)
CO2: 21 mmol/L — AB (ref 22–32)
CREATININE: 1.42 mg/dL — AB (ref 0.44–1.00)
Calcium: 9.8 mg/dL (ref 8.9–10.3)
GFR calc Af Amer: 42 mL/min — ABNORMAL LOW (ref >60)
GFR, EST NON AFRICAN AMERICAN: 36 mL/min — AB (ref >60)
Glucose, Bld: 110 mg/dL — ABNORMAL HIGH (ref 65–99)
POTASSIUM: 5.2 mmol/L — AB (ref 3.5–5.1)
SODIUM: 133 mmol/L — AB (ref 135–145)
Total Bilirubin: 0.4 mg/dL (ref 0.3–1.2)
Total Protein: 5.1 g/dL — ABNORMAL LOW (ref 6.5–8.1)

## 2016-09-13 LAB — PROTIME-INR
INR: 1.04
PROTHROMBIN TIME: 13.6 s (ref 11.4–15.2)

## 2016-09-13 LAB — APTT: APTT: 24 s (ref 24–36)

## 2016-09-13 LAB — GLUCOSE, CAPILLARY: GLUCOSE-CAPILLARY: 146 mg/dL — AB (ref 65–99)

## 2016-09-13 LAB — MRSA PCR SCREENING: MRSA BY PCR: NEGATIVE

## 2016-09-13 MED ORDER — INSULIN ASPART 100 UNIT/ML ~~LOC~~ SOLN: 0.0000 [IU] | Freq: Two times a day (BID) | SUBCUTANEOUS | Status: DC

## 2016-09-13 MED ORDER — ONDANSETRON HCL 4 MG PO TABS: 4.0000 mg | ORAL_TABLET | Freq: Four times a day (QID) | ORAL | Status: DC | PRN

## 2016-09-13 MED ORDER — ACETAMINOPHEN 325 MG PO TABS: 650.0000 mg | ORAL_TABLET | Freq: Four times a day (QID) | ORAL | Status: DC | PRN

## 2016-09-13 MED ORDER — ONDANSETRON HCL 4 MG/2ML IJ SOLN
4.0000 mg | Freq: Once | INTRAMUSCULAR | Status: DC
  Filled 2016-09-13: qty 2

## 2016-09-13 MED ORDER — SODIUM CHLORIDE 0.9 % IV SOLN
INTRAVENOUS | Status: DC
  Administered 2016-09-13 – 2016-09-15 (×3): via INTRAVENOUS

## 2016-09-13 MED ORDER — ONDANSETRON HCL 4 MG/2ML IJ SOLN
4.0000 mg | Freq: Four times a day (QID) | INTRAMUSCULAR | Status: DC | PRN
  Administered 2016-09-15 – 2016-09-16 (×3): 4 mg via INTRAVENOUS
  Filled 2016-09-13 (×3): qty 2

## 2016-09-13 MED ORDER — VITAMIN D 1000 UNITS PO TABS
1000.0000 [IU] | ORAL_TABLET | Freq: Every day | ORAL | Status: DC
  Administered 2016-09-13 – 2016-09-17 (×5): 1000 [IU] via ORAL
  Filled 2016-09-13 (×6): qty 1

## 2016-09-13 MED ORDER — ACETAMINOPHEN 650 MG RE SUPP: 650.0000 mg | Freq: Four times a day (QID) | RECTAL | Status: DC | PRN

## 2016-09-13 MED ORDER — SALINE SPRAY 0.65 % NA SOLN
1.0000 | Freq: Three times a day (TID) | NASAL | Status: DC
  Administered 2016-09-15: 1 via NASAL
  Filled 2016-09-13 (×3): qty 44

## 2016-09-13 MED ORDER — FLUTICASONE FUROATE-VILANTEROL 200-25 MCG/INH IN AEPB
1.0000 | INHALATION_SPRAY | Freq: Every day | RESPIRATORY_TRACT | Status: DC
  Administered 2016-09-14 – 2016-09-18 (×5): 1 via RESPIRATORY_TRACT
  Filled 2016-09-13: qty 28

## 2016-09-13 MED ORDER — PREDNISONE 10 MG PO TABS
10.0000 mg | ORAL_TABLET | Freq: Every day | ORAL | Status: DC
  Administered 2016-09-14: 10 mg via ORAL
  Filled 2016-09-13: qty 1

## 2016-09-13 MED ORDER — IPRATROPIUM-ALBUTEROL 0.5-2.5 (3) MG/3ML IN SOLN
3.0000 mL | Freq: Four times a day (QID) | RESPIRATORY_TRACT | Status: DC
  Administered 2016-09-13 – 2016-09-14 (×5): 3 mL via RESPIRATORY_TRACT
  Filled 2016-09-13 (×6): qty 3

## 2016-09-13 MED ORDER — BISACODYL 5 MG PO TBEC: 5.0000 mg | DELAYED_RELEASE_TABLET | Freq: Every day | ORAL | Status: DC | PRN

## 2016-09-13 MED ORDER — SODIUM CHLORIDE 0.9 % IV BOLUS (SEPSIS)
500.0000 mL | Freq: Once | INTRAVENOUS | Status: AC
  Administered 2016-09-13: 500 mL via INTRAVENOUS

## 2016-09-13 MED ORDER — ASPIRIN EC 325 MG PO TBEC
325.0000 mg | DELAYED_RELEASE_TABLET | Freq: Every day | ORAL | Status: DC
  Administered 2016-09-13 – 2016-09-17 (×5): 325 mg via ORAL
  Filled 2016-09-13 (×6): qty 1

## 2016-09-13 MED ORDER — ENOXAPARIN SODIUM 30 MG/0.3ML ~~LOC~~ SOLN
30.0000 mg | SUBCUTANEOUS | Status: DC
  Administered 2016-09-13 – 2016-09-16 (×4): 30 mg via SUBCUTANEOUS
  Filled 2016-09-13 (×4): qty 0.3

## 2016-09-13 MED ORDER — AMLODIPINE BESYLATE 5 MG PO TABS
5.0000 mg | ORAL_TABLET | Freq: Every day | ORAL | Status: DC
  Administered 2016-09-13 – 2016-09-17 (×5): 5 mg via ORAL
  Filled 2016-09-13 (×6): qty 1

## 2016-09-13 MED ORDER — GUAIFENESIN-DM 100-10 MG/5ML PO SYRP
10.0000 mL | ORAL_SOLUTION | ORAL | Status: DC | PRN
  Administered 2016-09-13 – 2016-09-17 (×9): 10 mL via ORAL
  Filled 2016-09-13 (×9): qty 10

## 2016-09-13 MED ORDER — POLYETHYLENE GLYCOL 3350 17 G PO PACK
17.0000 g | PACK | Freq: Every day | ORAL | Status: DC
  Administered 2016-09-14 – 2016-09-17 (×4): 17 g via ORAL
  Filled 2016-09-13 (×6): qty 1

## 2016-09-13 MED ORDER — DEMECLOCYCLINE HCL 150 MG PO TABS
150.0000 mg | ORAL_TABLET | Freq: Two times a day (BID) | ORAL | Status: DC
  Administered 2016-09-13: 150 mg via ORAL
  Filled 2016-09-13 (×4): qty 1

## 2016-09-13 MED ORDER — DOCUSATE SODIUM 100 MG PO CAPS
100.0000 mg | ORAL_CAPSULE | Freq: Two times a day (BID) | ORAL | Status: DC
  Administered 2016-09-13 – 2016-09-15 (×4): 100 mg via ORAL
  Filled 2016-09-13 (×7): qty 1

## 2016-09-13 MED ORDER — FERROUS SULFATE 300 (60 FE) MG/5ML PO SYRP
300.0000 mg | ORAL_SOLUTION | Freq: Every day | ORAL | Status: DC
  Administered 2016-09-13 – 2016-09-17 (×5): 300 mg via ORAL
  Filled 2016-09-13 (×8): qty 5

## 2016-09-13 MED ORDER — PANTOPRAZOLE SODIUM 40 MG PO TBEC
40.0000 mg | DELAYED_RELEASE_TABLET | Freq: Every day | ORAL | Status: DC
  Administered 2016-09-13 – 2016-09-17 (×5): 40 mg via ORAL
  Filled 2016-09-13 (×5): qty 1

## 2016-09-13 MED ORDER — RISAQUAD PO CAPS
1.0000 | ORAL_CAPSULE | Freq: Every day | ORAL | Status: DC
  Administered 2016-09-13 – 2016-09-17 (×5): 1 via ORAL
  Filled 2016-09-13 (×6): qty 1

## 2016-09-13 MED ORDER — FLEET ENEMA 7-19 GM/118ML RE ENEM: 1.0000 | ENEMA | Freq: Once | RECTAL | Status: DC | PRN

## 2016-09-13 MED ORDER — PRO-STAT 64 PO LIQD
30.0000 mL | Freq: Two times a day (BID) | ORAL | Status: DC
  Administered 2016-09-13 – 2016-09-15 (×4): 30 mL via ORAL
  Administered 2016-09-16: 21:00:00 via ORAL
  Administered 2016-09-16 – 2016-09-17 (×3): 30 mL via ORAL
  Filled 2016-09-13 (×9): qty 30

## 2016-09-13 MED ORDER — MIRTAZAPINE 15 MG PO TABS
15.0000 mg | ORAL_TABLET | Freq: Every day | ORAL | Status: DC
  Administered 2016-09-13 – 2016-09-17 (×5): 15 mg via ORAL
  Filled 2016-09-13 (×5): qty 1

## 2016-09-13 MED ORDER — DEXTROSE 5 % IV SOLN
2.0000 g | INTRAVENOUS | Status: DC
  Filled 2016-09-13: qty 2

## 2016-09-13 MED ORDER — VITAMIN C 500 MG PO TABS
500.0000 mg | ORAL_TABLET | Freq: Every day | ORAL | Status: DC
  Administered 2016-09-13 – 2016-09-17 (×5): 500 mg via ORAL
  Filled 2016-09-13 (×9): qty 1

## 2016-09-13 NOTE — ED Notes (Signed)
Pt denies nausea at present.

## 2016-09-13 NOTE — H&P (Signed)
History and Physical    Leah Dalton FGH:[id number] DOB: Feb 12, 1946 DOA: 09/18/2016  PCP: Crisoforo Oxford, MD Consultants:  Luan Pulling - pulmonology Patient coming from: [facility] - has been there since December (permanent resident); [facility]: son  Chief Complaint:  Sent from NH for anemia, ?transfusion  HPI: Leah Dalton is a 71 y.o. female with medical history significant of COPD with chronic hypercarbic respiratory failure, GERD with esophagitis, hereditary hemachromatosis, history of polycythemia requiring therapeutic phlebotomy remotely, and recent (5/28-6/4) admission for HCAP with ?endocarditis (unable to perform TEE so empirically treating with Cefepime x 6 weeks) sent from NH for evaluation of abnormal lab.  She is continuing to receive IV abx.  Coughs intermittently since discharge, no change.  She was sent because her hemoglobin was low (reportedly 6), "they thought I needed a blood transfusion."  She is uncertain why they checked it, just SOB and tired.  She has COPD and is often SOB, especially with the summer heat here and humidity up.  Appetite was awful the last 6 months with early satiety, recently started eating better after starting Megace; she was told that it would boost her appetite and "it has, I'm back to being hungry again."  Has not been drinking much this week; she thinks they do not give her enough access to ice water at the [facility].  During her last hospitalization, pulmonary and palliative care were consulted due to her COPD and recent functional decline.  She was made DNR.   ED Course:  Stable Hgb (despite being sent in for this) but creatinine 1.42 (up from 0.03).  Review of Systems: As per HPI; otherwise review of systems reviewed and negative.   Ambulatory Status:  Bedbound, unable to stand/bear weight  Past Medical History:  Diagnosis Date  . AVM (arteriovenous malformation) of colon   . COPD (chronic obstructive pulmonary disease) ([place])    O2 dependent,  started 05/31/2012  . Diverticula of colon   . Esophagitis   . GERD (gastroesophageal reflux disease)   . 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 History of tobacco abuse   . Osteoporosis   . Pneumonia   . Polycythemia    Has had to have phlebotomies in the pas    Past Surgical History:  Procedure Laterality Date  . ABDOMINAL HYSTERECTOMY    . CATARACT EXTRACTION    . 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 Engorged internal hemorrhoids; colonic diverticulosis.  Marland Kitchen ESOPHAGOGASTRODUODENOSCOPY N/A 02/23/2014   RMR:  Ulcerative/erosive esophagitis. small hiatal hernia    Social History   Social History  . Marital status: Divorced    Spouse name: N/A  . Number of children: N/A  . Years of education: N/A   Occupational History  . Not on file.   Social History Main Topics  . Smoking status: Former Smoker    Packs/day: 1.00    Years: 50.00    Quit date: 2012  . Smokeless tobacco: Never Used  . Alcohol use No  . Drug use: No  . Sexual activity: Not on file   Other Topics Concern  . Not on file   Social History Narrative  . No narrative on file    Allergies  Allergen Reactions  . Codeine Itching  . Daliresp [Roflumilast] Diarrhea  . Keflex [Cephalexin] Other (See Comments)    Gives pt yeast infection  . Penicillins Itching    Has patient had a  PCN reaction causing immediate rash, facial/tongue/throat swelling, SOB or lightheadedness with hypotension: No Has patient had a PCN reaction causing severe rash involving mucus membranes or skin necrosis: Yes Has patient had a PCN reaction that required hospitalization No Has patient had a PCN reaction occurring within the last 10 years: No If all of the above answers are "NO", then may proceed with Cephalosporin use.     History reviewed. No pertinent family history.  Prior to Admission medications   Medication Sig Start Date End Date Taking?  Authorizing Provider  acetaminophen (TYLENOL) 325 MG tablet Take 650 mg by mouth every 6 (six) hours as needed for moderate pain.   Yes [provider]  Amino Acids-Protein Hydrolys (FEEDING SUPPLEMENT, PRO-STAT 64,) LIQD Take 30 mLs by mouth 2 (two) times daily.   Yes [provider]  amLODipine (NORVASC) 5 MG tablet Take 5 mg by mouth daily.   Yes [provider]  aspirin EC 325 MG tablet Take 325 mg by mouth daily.   Yes [provider]  ceFEPIme 2 g in dextrose 5 % 50 mL Inject 2 g into the vein every 8 (eight) hours. 08/28/16 10/02/16 Yes Eber Jones, MD  Cholecalciferol (VITAMIN D-3) 1000 units CAPS Take 1 tablet by mouth daily.   Yes [provider]  demeclocycline (DECLOMYCIN) 150 MG tablet Take 150 mg by mouth 2 (two) times daily.   Yes [provider]  docusate sodium (COLACE) 100 MG capsule Take 100 mg by mouth 2 (two) times daily.   Yes [provider]  ferrous sulfate 300 (60 Fe) MG/5ML syrup Take 300 mg by mouth daily.   Yes [provider]  fluticasone furoate-vilanterol (BREO ELLIPTA) 200-25 MCG/INH AEPB Inhale 1 puff into the lungs daily.   Yes [provider]  furosemide (LASIX) 40 MG tablet Take 40 mg by mouth daily.    Yes [provider]  guaiFENesin-dextromethorphan (ROBITUSSIN DM) 100-10 MG/5ML syrup Take 10 mLs by mouth every 4 (four) hours as needed for cough.   Yes [provider]  insulin aspart (NOVOLOG) 100 UNIT/ML injection Inject 0-10 Units into the skin 2 (two) times daily. Sliding Scale:: 60-200= 0 units 201-250= 2 units 251-300= 4 units 301-350= 6 units 351-400= 8 units 401-450=10units   Yes [provider]  ipratropium-albuterol (DUONEB) 0.5-2.5 (3) MG/3ML SOLN Take 3 mLs by nebulization every 6 (six) hours.   Yes [provider]  Lactobacillus (ACIDOPHILUS PO) Take 1 tablet by mouth daily.   Yes [provider]  megestrol  (MEGACE) 40 MG/ML suspension Take by mouth daily.   Yes [provider]  mirtazapine (REMERON) 15 MG tablet Take 15 mg by mouth at bedtime.   Yes [provider]  omeprazole (PRILOSEC) 20 MG capsule Take 20 mg by mouth daily.   Yes [provider]  ondansetron (ZOFRAN) 4 MG tablet Take 4 mg by mouth every 8 (eight) hours as needed for nausea or vomiting.   Yes [provider]  OXYGEN Inhale 3 L into the lungs daily. During day and evening shifts   Yes [provider]  potassium chloride (KLOR-CON) 20 MEQ packet Take 20 mEq by mouth daily. 03/15/16  Yes Sinda Du, MD  sodium chloride (OCEAN) 0.65 % SOLN nasal spray Place 1 spray into both nostrils 3 (three) times daily.   Yes [provider]  vitamin C (ASCORBIC ACID) 500 MG tablet Take 500 mg by mouth daily.   Yes [provider]  predniSONE (  DELTASONE) 10 MG tablet Take 10 mg by mouth daily with breakfast.    [provider]    Physical Exam: Vitals:   09/01/2016 1315 08/31/2016 1452 08/31/2016 1700 09/07/2016 1947  BP:  127/74 115/63   Pulse: 99  (!) 103   Resp:   20   Temp:   98.5 F (36.9 C)   TempSrc:   Oral   SpO2: 100%  100% 99%  Weight:   60.8 kg (134 lb)   Height:   5\' 4"  (1.626 m)      General: Appears calm and comfortable and is NAD; intermittent wet cough.  Her hair is falling out in strands all over pillow and bedding. Eyes:  PERRL, EOMI, normal lids, iris ENT:  grossly normal hearing, lips & tongue, mmm Neck:  no LAD, masses or thyromegaly Cardiovascular:  RRR, no m/r/g.  Respiratory:  CTA bilaterally, no w/r/r. Mildly increasedrespiratory effort despite [place] O2. Abdomen:  soft, ntnd, NABS Skin:  no rash or induration seen on limited exam, diffuse ecchymoses Musculoskeletal:  Marked B (L>R which is apparently chronic and unchanged) LE edema with atrophy of LE Psychiatric:  grossly normal mood and affect, speech fluent and appropriate,  AOx3 Neurologic:  CN 2-12 grossly intact, moves all extremities in coordinated fashion, sensation intact  Labs on Admission: I have personally reviewed following labs and imaging studies  CBC:  Recent Labs Lab 09/04/2016 1216  WBC 17.6*  NEUTROABS 14.6*  HGB 8.1*  HCT 25.0*  MCV 91.2  PLT 301*   Basic Metabolic Panel:  Recent Labs Lab 09/10/2016 1216  NA 133*  K 5.2*  CL 105  CO2 21*  GLUCOSE 110*  BUN 61*  CREATININE 1.42*  CALCIUM 9.8   GFR: Estimated Creatinine Clearance: 31.4 mL/min (A) (by C-G formula based on SCr of 1.42 mg/dL (H)). Liver Function Tests:  Recent Labs Lab 09/02/2016 1216  AST 15  ALT 17  ALKPHOS 72  BILITOT 0.4  PROT 5.1*  ALBUMIN 2.0*   No results for input(s): LIPASE, AMYLASE in the last 168 hours. No results for input(s): AMMONIA in the last 168 hours. Coagulation Profile:  Recent Labs Lab 08/29/2016 1251  INR 1.04   Cardiac Enzymes: No results for input(s): CKTOTAL, CKMB, CKMBINDEX, TROPONINI in the last 168 hours. BNP (last 3 results) No results for input(s): PROBNP in the last 8760 hours. HbA1C: No results for input(s): HGBA1C in the last 72 hours. CBG: No results for input(s): GLUCAP in the last 168 hours. Lipid Profile: No results for input(s): CHOL, HDL, LDLCALC, TRIG, CHOLHDL, LDLDIRECT in the last 72 hours. Thyroid Function Tests: No results for input(s): TSH, T4TOTAL, FREET4, T3FREE, THYROIDAB in the last 72 hours. Anemia Panel: No results for input(s): VITAMINB12, FOLATE, FERRITIN, TIBC, IRON, RETICCTPCT in the last 72 hours. Urine analysis: No results found for: COLORURINE, APPEARANCEUR, LABSPEC, PHURINE, GLUCOSEU, HGBUR, BILIRUBINUR, KETONESUR, PROTEINUR, UROBILINOGEN, NITRITE, LEUKOCYTESUR  Creatinine Clearance: Estimated Creatinine Clearance: 31.4 mL/min (A) (by C-G formula based on SCr of 1.42 mg/dL (H)).  Sepsis Labs: @LABRCNTIP (procalcitonin:4,lacticidven:4) )No results found for this or any previous visit  (from the past 240 hour(s)).   Radiological Exams on Admission: Dg Chest Portable 1 View  Result Date: 09/17/2016 CLINICAL DATA:  Weakness and shortness of breath. EXAM: PORTABLE CHEST 1 VIEW COMPARISON:  Chest CT dated 08/22/2016 and chest x-rays dated 08/21/2016 and 04/19/2016 FINDINGS: There has been almost complete resolution of the cavitary infiltrate at the right lung base as well as the infiltrate medially in  the right upper lobe. Irregular nodular density in the right upper lobe persists as demonstrated on the prior CT scan. No acute abnormalities. Heart size and vascularity are normal. PICC appears in good position. IMPRESSION: Marked improvement in the infiltrates in the right upper lobe medially and in the right lung base. Irregular nodular density in the right upper lobe persists. I recommend a CT scan of the chest without contrast in 2 months for further evaluation of this area. Electronically Signed   By: Lorriane Shire M.D.   On: 09/06/2016 12:32    EKG: Independently reviewed.  Sinus tachycardia with rate 107; nonspecific ST changes with no evidence of acute ischemia; NSCSLT  Assessment/Plan Principal Problem:   Acute renal failure (ARF) (HCC) Active Problems:   COPD (chronic obstructive pulmonary disease) (HCC)   Anemia   Thrombocytosis (HCC)   Leukocytosis   Hypoalbuminemia   Hyperglycemia   ARF -BUN 61/Creatinine 1.42/GFR 36; prior 9/<0.3 on 6/4 -Presumed secondary to decreased fluid intake, as patient reports that despite the heat she is drinking less and thirsty -Will replete and follow -If not improving with IVF, would then suggest conducting a more thorough evaluation to include FeNa and renal US -K+ 5.2, will follow after hydration -Another consideration is her demeclocycline.  I cannot tell in a cursory chart review when or why this medication was started or is being used (?SIADH).  It is a tetracycline and so could increase her sun sensitivity and could be a  contributing factor.  Will hold for now.  Anemia -Hgb 8.1, prior 8.2 - stable -She was sent in from the SNF for concern of Hgb 6.5 and possible need for transfusion, but this does not appear to be an issue at this time -Does not report seeing any blood in stools or otherwise  COPD -Recently hospitalized for HCAP, on week 3/6 of Cefepime -Continues to cough, which is not unexpected although should be improving from a pneumonia-standpoint 3-4 weeks out -Reports ongoing SOB and COPD symptoms with 3L home O2 -Her CT during her hospitalization showed a RML cavitary PNA with other areas of necrotic/cavitary features concerning for septic embolic process (which led to the concern for endocarditis) -She does apparently have approaching end-stage COPD, as palliative care was consulted during last admission -On Breo Ellipta, Robitussin DM, DuoNebs, daily prednisone, and antibiotics (Cefepime and Demeclocycline as previously noted) -DVT US negative during last hospitalization, so LE edema does not appear to be clot-related; could consider repeat CT or CTA - will wait until creatinine improves -Will consult [name] to see if he has any additional recommendations  Hyperglycemia -Glucose 110 -May be stress response -Will follow with fasting AM labs -It is unlikely that she will need acute or chronic treatment for this issue  Hypoalbuminemia -Albumin 2.0, prior 1.7 on 5/29 -Severe -Nutrition consult not apparently ordered during previous hospitalization, will order now  Leukocytosis -WBC 17.6, prior 9.5 on 6/3 -She is not obviously having a new or worsening infection -Consider CT or CTA once creatinine improves, as noted above -Continue Cefepime (dose changed by pharmacy for renal adjustment) -Hold Demeclocycline -Could also be an acute phase reactant, will trend  Thrombocytosis -Platelets 471, prior 729 - improved   DVT prophylaxis:  Lovenox  Code Status: DNR - confirmed with  patient Family Communication: None present Disposition Plan:  Home once clinically improved Consults called: Pulmonology;  nutrition Admission status: It is my clinical opinion that referral for OBSERVATION is reasonable and necessary in this patient based on  the above information provided. The aforementioned taken together are felt to place the patient at high risk for further clinical deterioration. However it is anticipated that the patient may be medically stable for discharge from the hospital within 24 to 48 hours.    Karmen Bongo MD [facility]  If 7PM-7AM, please contact night-coverage [email or web address] Password [facility]  08/31/2016, 8:19 PM

## 2016-09-13 NOTE — ED Provider Notes (Signed)
[facility] DEPT Provider Note   CSN: [id number] Arrival date & time: 09/12/2016  1149     History   Chief Complaint Chief Complaint  Patient presents with  . Weakness    HPI Leah Dalton is a 71 y.o. female.  HPI Pt is a resident of a nursing center.  She has been feeling more short of breath recently but thought it was related to the head and humidity.  Blood tests were run and she was told her hemoglobin was low so she was sent to the hospital.  She denies any blood in her stool.  No bleeding anywhere that she has noticed.  No dark stools.  She has had trouble with a low hemolgobin in the past.  Pt has history of hemachromatosis and polycythemia in the past requiring blood donation but never has had a transfusion.    Past Medical History:  Diagnosis Date  . AVM (arteriovenous malformation) of colon   . COPD (chronic obstructive pulmonary disease) ([place])    O2 dependent, started 05/31/2012  . Diverticula of colon   . Esophagitis   . GERD (gastroesophageal reflux disease)   . 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 History of tobacco abuse   . Osteoporosis   . Pneumonia   . Polycythemia    Has had to have phlebotomies in the pas    Patient Active Problem List   Diagnosis Date Noted  . Encounter for hospice care discussion   . Palliative care encounter   . Goals of care, counseling/discussion   . DNR (do not resuscitate) discussion   . Hypoalbuminemia   . Thrombocytosis ([place]) 08/21/2016  . Leukocytosis 08/21/2016  . Positive D dimer 08/21/2016  . HCAP (healthcare-associated pneumonia) 08/21/2016  . Pneumonia of right middle lobe due to infectious organism ([place])   . Acute hypokalemia 02/20/2016  . 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 AVM (arteriovenous malformation) of colon 04/10/2014  . IDA (iron deficiency anemia) 04/10/2014  . External hemorrhoids 02/24/2014  . Reflux esophagitis   . COPD (chronic obstructive pulmonary disease) ([place]) 06/07/2012    . Acute-on-chronic respiratory failure ([place]) 06/07/2012  . Polycythemia 06/07/2012  . Hyponatremia 06/07/2012    Past Surgical History:  Procedure Laterality Date  . ABDOMINAL HYSTERECTOMY    . CATARACT EXTRACTION    . 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 Engorged internal hemorrhoids; colonic diverticulosis.  Marland Kitchen ESOPHAGOGASTRODUODENOSCOPY N/A 02/23/2014   RMR:  Ulcerative/erosive esophagitis. small hiatal hernia    OB History    Gravida Para Term Preterm AB Living             1   SAB TAB Ectopic Multiple Live Births                   Home Medications    Prior to Admission medications   Medication Sig Start Date End Date Taking? Authorizing Provider  acetaminophen (TYLENOL) 325 MG tablet Take 650 mg by mouth every 6 (six) hours as needed for moderate pain.   Yes [provider]  Amino Acids-Protein Hydrolys (FEEDING SUPPLEMENT, PRO-STAT 64,) LIQD Take 30 mLs by mouth 2 (two) times daily.   Yes [provider]  amLODipine (NORVASC) 5 MG tablet Take 5 mg by mouth daily.   Yes [provider]  aspirin EC 325 MG tablet Take 325 mg by mouth daily.   Yes [provider]  ceFEPIme 2 g in dextrose 5 %  50 mL Inject 2 g into the vein every 8 (eight) hours. 08/28/16 10/02/16 Yes Eber Jones, MD  Cholecalciferol (VITAMIN D-3) 1000 units CAPS Take 1 tablet by mouth daily.   Yes [provider]  demeclocycline (DECLOMYCIN) 150 MG tablet Take 150 mg by mouth 2 (two) times daily.   Yes [provider]  docusate sodium (COLACE) 100 MG capsule Take 100 mg by mouth 2 (two) times daily.   Yes [provider]  ferrous sulfate 300 (60 Fe) MG/5ML syrup Take 300 mg by mouth daily.   Yes [provider]  fluticasone furoate-vilanterol (BREO ELLIPTA) 200-25 MCG/INH AEPB Inhale 1 puff into the lungs daily.   Yes [provider]  furosemide (LASIX) 40 MG  tablet Take 40 mg by mouth daily.    Yes [provider]  guaiFENesin-dextromethorphan (ROBITUSSIN DM) 100-10 MG/5ML syrup Take 10 mLs by mouth every 4 (four) hours as needed for cough.   Yes [provider]  insulin aspart (NOVOLOG) 100 UNIT/ML injection Inject 0-10 Units into the skin 2 (two) times daily. Sliding Scale:: 60-200= 0 units 201-250= 2 units 251-300= 4 units 301-350= 6 units 351-400= 8 units 401-450=10units   Yes [provider]  ipratropium-albuterol (DUONEB) 0.5-2.5 (3) MG/3ML SOLN Take 3 mLs by nebulization every 6 (six) hours.   Yes [provider]  Lactobacillus (ACIDOPHILUS PO) Take 1 tablet by mouth daily.   Yes [provider]  megestrol (MEGACE) 40 MG/ML suspension Take by mouth daily.   Yes [provider]  mirtazapine (REMERON) 15 MG tablet Take 15 mg by mouth at bedtime.   Yes [provider]  omeprazole (PRILOSEC) 20 MG capsule Take 20 mg by mouth daily.   Yes [provider]  ondansetron (ZOFRAN) 4 MG tablet Take 4 mg by mouth every 8 (eight) hours as needed for nausea or vomiting.   Yes [provider]  OXYGEN Inhale 3 L into the lungs daily. During day and evening shifts   Yes [provider]  potassium chloride (KLOR-CON) 20 MEQ packet Take 20 mEq by mouth daily. 03/15/16  Yes Sinda Du, MD  sodium chloride (OCEAN) 0.65 % SOLN nasal spray Place 1 spray into both nostrils 3 (three) times daily.   Yes [provider]  vitamin C (ASCORBIC ACID) 500 MG tablet Take 500 mg by mouth daily.   Yes [provider]  predniSONE (DELTASONE) 10 MG tablet Take 10 mg by mouth daily with breakfast.    [provider]    Family History History reviewed. No pertinent family history.  Social History Social History  Substance Use Topics  . Smoking status: Former Smoker    Quit date: 2012  . Smokeless tobacco: Never Used  . Alcohol use No      Allergies   Codeine; Daliresp [roflumilast]; Keflex [cephalexin]; and Penicillins   Review of Systems Review of Systems  Constitutional: Negative for fever.  Respiratory: Positive for shortness of breath.   Cardiovascular: Negative for chest pain.  Gastrointestinal: Negative for abdominal pain.  All other systems reviewed and are negative.    Physical Exam Updated Vital Signs BP 127/74   Pulse 99   Temp 97.5 F (36.4 C) (Oral)   Resp 14   Ht 1.626 m (5\' 4" )   Wt 60.8 kg (134 lb)   SpO2 100%   BMI 23.00 kg/m   Physical Exam  Constitutional: No distress.  Frail   HENT:  Head: Normocephalic and atraumatic.  Right Ear:  External ear normal.  Left Ear: External ear normal.  Eyes: Conjunctivae are normal. Right eye exhibits no discharge. Left eye exhibits no discharge. No scleral icterus.  Neck: Neck supple. No tracheal deviation present.  Cardiovascular: Normal rate, regular rhythm and intact distal pulses.   Pulmonary/Chest: Effort normal. No stridor. No respiratory distress. She has wheezes (slight, sporadic). She has no rales.  Abdominal: Soft. Bowel sounds are normal. She exhibits no distension. There is no tenderness. There is no rebound and no guarding.  Musculoskeletal: She exhibits no edema or tenderness.  Neurological: She is alert. She has normal strength. No cranial nerve deficit (no facial droop, extraocular movements intact, no slurred speech) or sensory deficit. She exhibits normal muscle tone. She displays no seizure activity. Coordination normal.  Skin: Skin is warm and dry. She is not diaphoretic.  Skin is thin, multiple areas of bruising noted on arms and legs  Psychiatric: She has a normal mood and affect.  Nursing note and vitals reviewed.    ED Treatments / Results  Labs (all labs ordered are listed, but only abnormal results are displayed) Labs Reviewed  CBC WITH DIFFERENTIAL/PLATELET - Abnormal; Notable for the following:       Result  Value   WBC 17.6 (*)    RBC 2.74 (*)    Hemoglobin 8.1 (*)    HCT 25.0 (*)    RDW 19.2 (*)    Platelets 471 (*)    Neutro Abs 14.6 (*)    Monocytes Absolute 1.3 (*)    All other components within normal limits  COMPREHENSIVE METABOLIC PANEL - Abnormal; Notable for the following:    Sodium 133 (*)    Potassium 5.2 (*)    CO2 21 (*)    Glucose, Bld 110 (*)    BUN 61 (*)    Creatinine, Ser 1.42 (*)    Total Protein 5.1 (*)    Albumin 2.0 (*)    GFR calc non Af Amer 36 (*)    GFR calc Af Amer 42 (*)    All other components within normal limits  PROTIME-INR  APTT  TYPE AND SCREEN    EKG  EKG Interpretation  Date/Time:  Wednesday September 13 2016 11:55:23 EDT Ventricular Rate:  107 PR Interval:    QRS Duration: 81 QT Interval:  300 QTC Calculation: 401 R Axis:   75 Text Interpretation:  Sinus tachycardia Consider right atrial enlargement Low voltage, precordial leads No significant change since last tracing Confirmed by Dorie Rank [phone]) on 09/22/2016 12:13:57 PM       Radiology Dg Chest Portable 1 View  Result Date: 09/06/2016 CLINICAL DATA:  Weakness and shortness of breath. EXAM: PORTABLE CHEST 1 VIEW COMPARISON:  Chest CT dated 08/22/2016 and chest x-rays dated 08/21/2016 and 04/19/2016 FINDINGS: There has been almost complete resolution of the cavitary infiltrate at the right lung base as well as the infiltrate medially in the right upper lobe. Irregular nodular density in the right upper lobe persists as demonstrated on the prior CT scan. No acute abnormalities. Heart size and vascularity are normal. PICC appears in good position. IMPRESSION: Marked improvement in the infiltrates in the right upper lobe medially and in the right lung base. Irregular nodular density in the right upper lobe persists. I recommend a CT scan of the chest without contrast in 2 months for further evaluation of this area. Electronically Signed   By: Lorriane Shire M.D.   On: 09/16/2016 12:32     Procedures Procedures (including  critical care time)  Medications Ordered in ED Medications  ondansetron (ZOFRAN) injection 4 mg (4 mg Intravenous Not Given 09/06/2016 1221)  sodium chloride 0.9 % bolus 500 mL (500 mLs Intravenous New Bag/Given 09/10/2016 1458)     Initial Impression / Assessment and Plan / ED Course  I have reviewed the triage vital signs and the nursing notes.  Pertinent labs & imaging results that were available during my care of the patient were reviewed by me and considered in my medical decision making (see chart for details).  Clinical Course as of Sep 13 1505  Wed Sep 13, 2016  1433 Cr has increased to 1.42 from less than 0.3 2 weeks ago.    [JK]  1434 Hgb is 8.1 which is stable.   No decreased hemoglobin as suggested by the nursing home results.  Possible hemolysis on their specimen?  [JK]    Clinical Course User Index [JK] Dorie Rank, MD   The patient presented to the emergency room for evaluation of possible anemia. Laboratory tests today however show a stable anemia and not a hemoglobin of 6.5 that she was sent in for. Patient's laboratory tests have her do show acute kidney injury.  Her creatinine is up to 1.42 was 0.3 her BUN is also elevated. Patient has multiple medical problems and is on several medications that could be contributing.  Considering her overall poor health I will consult the medical service for further monitoring and evaluation of her renal function.   Final Clinical Impressions(s) / ED Diagnoses   Final diagnoses:  Acute kidney injury ([place])  Leukocytosis, unspecified type      Dorie Rank, MD 09/07/2016 1507

## 2016-09-13 NOTE — ED Triage Notes (Signed)
[facility] from [facility] [place] for hmg 6.5.  C/o weakness and sob.   States she has COPD and she is more sob due to the humidity.  Pt states she has had to be transfused in the past for anemia.

## 2016-09-13 NOTE — Discharge Instructions (Signed)
Hold your potassium and your lasix

## 2016-09-13 NOTE — Care Management Obs Status (Signed)
[facility] NOTIFICATION   Patient Details  Name: Leah Dalton MRN: [id number] Date of Birth: Oct 30, 1945   Medicare Observation Status Notification Given:  Yes    Sherald Barge, RN 09/07/2016, 4:37 PM

## 2016-09-13 NOTE — ED Triage Notes (Signed)
Pt currently being treated for pneumonia with IV antibiodics.  Has double lumen PICC to right upper arm.

## 2016-09-14 ENCOUNTER — Observation Stay (HOSPITAL_COMMUNITY): Payer: Medicare Other

## 2016-09-14 DIAGNOSIS — T501X5A Adverse effect of loop [high-ceiling] diuretics, initial encounter: Secondary | ICD-10-CM | POA: Diagnosis present

## 2016-09-14 DIAGNOSIS — Y95 Nosocomial condition: Secondary | ICD-10-CM | POA: Diagnosis present

## 2016-09-14 DIAGNOSIS — J449 Chronic obstructive pulmonary disease, unspecified: Secondary | ICD-10-CM | POA: Diagnosis not present

## 2016-09-14 DIAGNOSIS — Z66 Do not resuscitate: Secondary | ICD-10-CM | POA: Diagnosis present

## 2016-09-14 DIAGNOSIS — J159 Unspecified bacterial pneumonia: Secondary | ICD-10-CM | POA: Diagnosis not present

## 2016-09-14 DIAGNOSIS — J9611 Chronic respiratory failure with hypoxia: Secondary | ICD-10-CM | POA: Diagnosis present

## 2016-09-14 DIAGNOSIS — E43 Unspecified severe protein-calorie malnutrition: Secondary | ICD-10-CM | POA: Diagnosis not present

## 2016-09-14 DIAGNOSIS — Z6823 Body mass index (BMI) 23.0-23.9, adult: Secondary | ICD-10-CM | POA: Diagnosis not present

## 2016-09-14 DIAGNOSIS — Z90710 Acquired absence of both cervix and uterus: Secondary | ICD-10-CM | POA: Diagnosis not present

## 2016-09-14 DIAGNOSIS — J189 Pneumonia, unspecified organism: Secondary | ICD-10-CM | POA: Diagnosis not present

## 2016-09-14 DIAGNOSIS — J9612 Chronic respiratory failure with hypercapnia: Secondary | ICD-10-CM | POA: Diagnosis present

## 2016-09-14 DIAGNOSIS — N170 Acute kidney failure with tubular necrosis: Secondary | ICD-10-CM | POA: Diagnosis present

## 2016-09-14 DIAGNOSIS — M6281 Muscle weakness (generalized): Secondary | ICD-10-CM | POA: Diagnosis not present

## 2016-09-14 DIAGNOSIS — Z515 Encounter for palliative care: Secondary | ICD-10-CM | POA: Diagnosis not present

## 2016-09-14 DIAGNOSIS — E871 Hypo-osmolality and hyponatremia: Secondary | ICD-10-CM | POA: Diagnosis not present

## 2016-09-14 DIAGNOSIS — Z794 Long term (current) use of insulin: Secondary | ICD-10-CM | POA: Diagnosis not present

## 2016-09-14 DIAGNOSIS — Z9981 Dependence on supplemental oxygen: Secondary | ICD-10-CM | POA: Diagnosis not present

## 2016-09-14 DIAGNOSIS — Z7952 Long term (current) use of systemic steroids: Secondary | ICD-10-CM | POA: Diagnosis not present

## 2016-09-14 DIAGNOSIS — D649 Anemia, unspecified: Secondary | ICD-10-CM | POA: Diagnosis not present

## 2016-09-14 DIAGNOSIS — N179 Acute kidney failure, unspecified: Secondary | ICD-10-CM | POA: Diagnosis present

## 2016-09-14 DIAGNOSIS — K219 Gastro-esophageal reflux disease without esophagitis: Secondary | ICD-10-CM | POA: Diagnosis present

## 2016-09-14 DIAGNOSIS — D72823 Leukemoid reaction: Secondary | ICD-10-CM | POA: Diagnosis not present

## 2016-09-14 DIAGNOSIS — Z7401 Bed confinement status: Secondary | ICD-10-CM | POA: Diagnosis not present

## 2016-09-14 DIAGNOSIS — D638 Anemia in other chronic diseases classified elsewhere: Secondary | ICD-10-CM | POA: Diagnosis present

## 2016-09-14 DIAGNOSIS — R739 Hyperglycemia, unspecified: Secondary | ICD-10-CM | POA: Diagnosis present

## 2016-09-14 DIAGNOSIS — Z7189 Other specified counseling: Secondary | ICD-10-CM | POA: Diagnosis not present

## 2016-09-14 DIAGNOSIS — J441 Chronic obstructive pulmonary disease with (acute) exacerbation: Secondary | ICD-10-CM | POA: Diagnosis not present

## 2016-09-14 DIAGNOSIS — E872 Acidosis: Secondary | ICD-10-CM | POA: Diagnosis present

## 2016-09-14 DIAGNOSIS — Z7982 Long term (current) use of aspirin: Secondary | ICD-10-CM | POA: Diagnosis not present

## 2016-09-14 DIAGNOSIS — J15212 Pneumonia due to Methicillin resistant Staphylococcus aureus: Secondary | ICD-10-CM | POA: Diagnosis not present

## 2016-09-14 DIAGNOSIS — Z79899 Other long term (current) drug therapy: Secondary | ICD-10-CM | POA: Diagnosis not present

## 2016-09-14 DIAGNOSIS — I10 Essential (primary) hypertension: Secondary | ICD-10-CM | POA: Diagnosis not present

## 2016-09-14 DIAGNOSIS — Z7951 Long term (current) use of inhaled steroids: Secondary | ICD-10-CM | POA: Diagnosis not present

## 2016-09-14 DIAGNOSIS — J439 Emphysema, unspecified: Secondary | ICD-10-CM | POA: Diagnosis not present

## 2016-09-14 DIAGNOSIS — J440 Chronic obstructive pulmonary disease with acute lower respiratory infection: Secondary | ICD-10-CM | POA: Diagnosis present

## 2016-09-14 DIAGNOSIS — M810 Age-related osteoporosis without current pathological fracture: Secondary | ICD-10-CM | POA: Diagnosis present

## 2016-09-14 DIAGNOSIS — Z87891 Personal history of nicotine dependence: Secondary | ICD-10-CM | POA: Diagnosis not present

## 2016-09-14 DIAGNOSIS — E8809 Other disorders of plasma-protein metabolism, not elsewhere classified: Secondary | ICD-10-CM | POA: Diagnosis not present

## 2016-09-14 DIAGNOSIS — J9610 Chronic respiratory failure, unspecified whether with hypoxia or hypercapnia: Secondary | ICD-10-CM | POA: Diagnosis not present

## 2016-09-14 DIAGNOSIS — R0602 Shortness of breath: Secondary | ICD-10-CM | POA: Diagnosis not present

## 2016-09-14 DIAGNOSIS — D72829 Elevated white blood cell count, unspecified: Secondary | ICD-10-CM | POA: Diagnosis not present

## 2016-09-14 LAB — CBC
HCT: 22 % — ABNORMAL LOW (ref 36.0–46.0)
Hemoglobin: 7.1 g/dL — ABNORMAL LOW (ref 12.0–15.0)
MCH: 29.7 pg (ref 26.0–34.0)
MCHC: 32.3 g/dL (ref 30.0–36.0)
MCV: 92.1 fL (ref 78.0–100.0)
PLATELETS: 460 10*3/uL — AB (ref 150–400)
RBC: 2.39 MIL/uL — ABNORMAL LOW (ref 3.87–5.11)
RDW: 19.2 % — AB (ref 11.5–15.5)
WBC: 14 10*3/uL — ABNORMAL HIGH (ref 4.0–10.5)

## 2016-09-14 LAB — OSMOLALITY: Osmolality: 295 mosm/kg (ref 275–295)

## 2016-09-14 LAB — GLUCOSE, CAPILLARY
GLUCOSE-CAPILLARY: 107 mg/dL — AB (ref 65–99)
Glucose-Capillary: 113 mg/dL — ABNORMAL HIGH (ref 65–99)

## 2016-09-14 LAB — BASIC METABOLIC PANEL
Anion gap: 8 (ref 5–15)
BUN: 71 mg/dL — AB (ref 6–20)
CALCIUM: 8.9 mg/dL (ref 8.9–10.3)
CO2: 19 mmol/L — ABNORMAL LOW (ref 22–32)
CREATININE: 1.76 mg/dL — AB (ref 0.44–1.00)
Chloride: 103 mmol/L (ref 101–111)
GFR calc Af Amer: 32 mL/min — ABNORMAL LOW (ref >60)
GFR, EST NON AFRICAN AMERICAN: 28 mL/min — AB (ref >60)
Glucose, Bld: 127 mg/dL — ABNORMAL HIGH (ref 65–99)
Potassium: 5 mmol/L (ref 3.5–5.1)
Sodium: 130 mmol/L — ABNORMAL LOW (ref 135–145)

## 2016-09-14 LAB — SODIUM, URINE, RANDOM: Sodium, Ur: 18 mmol/L

## 2016-09-14 LAB — OSMOLALITY, URINE: Osmolality, Ur: 315 mosm/kg (ref 300–900)

## 2016-09-14 LAB — CREATININE, URINE, RANDOM: Creatinine, Urine: 36.63 mg/dL

## 2016-09-14 MED ORDER — DEXTROSE 5 % IV SOLN
1.0000 g | INTRAVENOUS | Status: DC
  Administered 2016-09-14 – 2016-09-17 (×4): 1 g via INTRAVENOUS
  Filled 2016-09-14 (×7): qty 1

## 2016-09-14 MED ORDER — BOOST / RESOURCE BREEZE PO LIQD
1.0000 | Freq: Three times a day (TID) | ORAL | Status: DC
  Administered 2016-09-15 – 2016-09-16 (×3): 1 via ORAL
  Administered 2016-09-16: 21:00:00 via ORAL
  Administered 2016-09-16 – 2016-09-17 (×4): 1 via ORAL

## 2016-09-14 MED ORDER — PREDNISONE 20 MG PO TABS
40.0000 mg | ORAL_TABLET | Freq: Every day | ORAL | Status: DC
  Administered 2016-09-15 – 2016-09-17 (×3): 40 mg via ORAL
  Filled 2016-09-14 (×4): qty 2

## 2016-09-14 MED ORDER — IPRATROPIUM-ALBUTEROL 0.5-2.5 (3) MG/3ML IN SOLN
3.0000 mL | Freq: Three times a day (TID) | RESPIRATORY_TRACT | Status: DC
  Administered 2016-09-15 – 2016-09-18 (×10): 3 mL via RESPIRATORY_TRACT
  Filled 2016-09-14 (×10): qty 3

## 2016-09-14 MED ORDER — ALBUTEROL SULFATE (2.5 MG/3ML) 0.083% IN NEBU: 2.5000 mg | INHALATION_SOLUTION | Freq: Four times a day (QID) | RESPIRATORY_TRACT | Status: DC | PRN

## 2016-09-14 MED ORDER — ALPRAZOLAM 0.5 MG PO TABS
0.5000 mg | ORAL_TABLET | Freq: Two times a day (BID) | ORAL | Status: DC | PRN
  Administered 2016-09-14 – 2016-09-17 (×6): 0.5 mg via ORAL
  Filled 2016-09-14 (×6): qty 1

## 2016-09-14 NOTE — Progress Notes (Addendum)
Initial Nutrition Assessment  DOCUMENTATION CODES:  Severe malnutrition in context of chronic illness  INTERVENTION:  Pt reports severe, ongoing constipation. Thinks she may need "a boost" to get her going.   Boost Breeze po TID, each supplement provides 250 kcal and 9 grams of protein  Food requests passed to dietary, including Prune juice daily  NUTRITION DIAGNOSIS:  Malnutrition (Severe) related to poor appetite, chronic illness as evidenced by severe fluid accumulation, severe depletion of body fat and loss of atleast 15% bw x6 months.   GOAL:  Patient will meet greater than or equal to 90% of their needs  MONITOR:  PO intake, Supplement acceptance, Labs, Weight trends, I & O's  REASON FOR ASSESSMENT:  Consult Assessment of nutrition requirement/status  ASSESSMENT:  71 y/o female PMHx COPD, Chronic resp failure, GERD w/ esophagitis, polycythemia. Was recently admitted 5/28-6/4 for HCAP and possible endocarditis. Discharged to SNF and now represents with reported anemia. Worked up for AKI.   Pt said she had been placed on megace, but per her D/C summary 6/4, she was on remeron, megace not listed. Her megace appears to only have been ordered yesterday and now stopped?  Regardless, she today reports a much better appetite since she left the hospital on 6/4. She says prior to that admission, she had very early satiety and would only be able to eat a few bites. Now, she is eating 50% of her meals at the [facility]. She also takes [employer] that she likes. She doesn't like ENsure at all. Will chage orders to reflect this.   Regarding her report of having "restricted access to water at [facility]", she clarifies that she feels they do not have enough staff; she was not on a fluid restriction. She has no n/v/d. She reports being severely constipated and feeling miserable(reported bm yesterday?). She had been taking prune juice each morning, but feels she needs "a boost" with a  laxative/softener-> Will recommend and reorder prune juice in mornings.   She says her UBW was 160 lbs 6 months ago, before she "completely  lost her appetite". This is consistent with weight records which shows she weighed 157 lbs last December. This is a loss of >20 lbs (14.6% bw). She has severe edema/fluid, but says that is nothing new. She has frequent weeping of legs.   At this time, pt still has a good appetite. RD took breakfast order.Will restart Boost breeze, mention severe constipation to MD, and put in place food requests.   Physical Exam: Demonstrates very severe edema to both leg and arms ( L>R). Has severe fat wasting or orbitals and thorax. Mild to Mod muscle wasting of temporalis, clavicle, deltoid.   Labs: K slightly elevated, Na: 130, BUN/Creat: 71/1.76, Albumin 2.0, WBC: 14.0 Meds: remeron, prostat, iron, Vitamin D, Colace, Miralax, ppi, VIt C, IV abx,    Recent Labs Lab 09/02/2016 1216 09/14/16 0410  NA 133* 130*  K 5.2* 5.0  CL 105 103  CO2 21* 19*  BUN 61* 71*  CREATININE 1.42* 1.76*  CALCIUM 9.8 8.9  GLUCOSE 110* 127*   Diet Order:  Diet regular Room service appropriate? Yes; Fluid consistency: Thin  Skin: generalized ecchymosis, blisters to R arm.  Last BM:  6/20  Height:  Ht Readings from Last 1 Encounters:  09/06/2016 5\' 4"  (1.626 m)   Weight:  Wt Readings from Last 1 Encounters:  09/15/2016 134 lb (60.8 kg)   Wt Readings from Last 10 Encounters:  09/18/2016 134 lb (60.8  kg)  08/21/16 134 lb 14.4 oz (61.2 kg)  03/13/16 163 lb 9.3 oz (74.2 kg)  02/20/16 160 lb 14.4 oz (73 kg)  04/28/15 160 lb (72.6 kg)  03/25/15 160 lb (72.6 kg)  04/10/14 155 lb 3.2 oz (70.4 kg)  02/22/14 157 lb 8 oz (71.4 kg)  06/07/12 149 lb 14.6 oz (68 kg)   Ideal Body Weight:  54.54 kg  BMI:  Body mass index is 23 kg/m.  Estimated Nutritional Needs:  Kcal:  1600-1800 kcals Protein:  75-87g Pro Fluid:  Per MD  EDUCATION NEEDS:  No education needs identified at this  time  Burtis Junes RD, LDN, [facility] Pager: [phone] 09/14/2016 5:38 PM

## 2016-09-14 NOTE — Progress Notes (Signed)
PROGRESS NOTE    Leah Dalton  TGG:[id number] DOB: [birth year] DOA: 08/28/2016 PCP: Crisoforo Oxford, MD   Brief Narrative:   71 year old with history of GERD, hereditary hemochromatosis, polycythemia with history of phlebotomy, COPD came to the ER after having found abnormal labs at the nursing home. Apparently she was told her hemoglobin was less than 7 therefore she was sent here but upon lab work here she was noted to have stable hemoglobin but her creatinine was elevated to 1.4 from her baseline of 0.3. IV fluid hydration was started.  Assessment & Plan:   Principal Problem:   Acute renal failure (ARF) (HCC) Active Problems:   COPD (chronic obstructive pulmonary disease) (HCC)   Anemia   Thrombocytosis (HCC)   Leukocytosis   Hypoalbuminemia   Hyperglycemia   Acute kidney injury -Likely prerenal in nature versus medication related -Continue IV fluid hydration. Monitor urine output -Order urine electrolytes and renal ultrasound -Demeclocycline has been stopped. Not sure why he was started at the time of discharge during the last admission  Possible mild exacerbation of mild intermittent COPD with history of recent HCAP -Continue nebulizers every 6 hours, Breo, Robitussin -Unable to get CT of the chest at this time given her renal function but at this time we can hold off. But she will need 1 after 4-6 weeks of completion of her antibiotics. Last dose of antibiotic on 10/02/2016 -Continue cefepime at this time. Discontinue Demeclocycline  -Supplemental oxygen as needed. Uses 3 L at home    anemia, likely chronic disease -Hemoglobin is 7.1 this morning without any acute signs of bleeding. Hold off on blood transfusion but will need transfusion if hemoglobin drops less than 7.  Hypoalbuminemia -Likely secondary to poor nutrition status -Encourage oral diet  Leukocytosis has mildly improved -Continue to monitor  Will get PT due to generalized weakness. Likely from  deconditioning   DVT prophylaxis:  renally dosed Lovenox  Code Status:  DO NOT RESUSCITATE  Family Communication:   none at bedside  Disposition Plan:  will change her to inpatient given her progression of acute renal failure. Atleast needs to be in the hospital for next 24-48Hrs  It is my clinical opinion that admission to [facility] is reasonable and necessary in this 71 y.o. female . presenting with symptoms of Generalized weakness, concerning for AKI . in the context of PMH including: COPD . with pertinent positives on physical exam including: Generalized weakness, diffuse diminished BS . and pertinent positives on radiographic and laboratory data including: Elevated Cr  . Workup and treatment include IV fluid hydration and further work up for AKI. Meanwhile getting tx for COPD exac and HCAP  Given the aforementioned, the predictability of an adverse outcome is felt to be significant. I expect that the patient will require at least 2 midnights in the hospital to treat this condition.   Consultants:   Pulmonary  Procedures:   None  Antimicrobials:   Cefepime   Subjective: Patient states she feels generalized weakness this morning and also reports of congestive chest and having difficulty bringing up phlegm. Otherwise denies any chest pain and lower extremity swelling fevers and other complaints  Objective: Vitals:   09/14/16 0614 09/14/16 0742 09/14/16 0800 09/14/16 0809  BP: 127/73 121/78    Pulse: (!) 108 (!) 105    Resp: 18 15    Temp: 98.7 F (37.1 C) 97.6 F (36.4 C)    TempSrc: Oral Oral    SpO2: 100% 100% 98% 99%  Weight:  Height:        Intake/Output Summary (Last 24 hours) at 09/14/16 1109 Last data filed at 09/14/16 0311  Gross per 24 hour  Intake             1085 ml  Output                0 ml  Net             1085 ml   Filed Weights   09/03/2016 1153 09/06/2016 1700  Weight: 60.8 kg (134 lb) 60.8 kg (134 lb)    Examination:  General exam:  Appears calm and comfortable , generally weak and frail-appearing  Respiratory system: diffused diminished breath sounds  Cardiovascular system: S1 & S2 heard, RRR. No JVD, murmurs, rubs, gallops or clicks. No pedal edema. Gastrointestinal system: Abdomen is nondistended, soft and nontender. No organomegaly or masses felt. Normal bowel sounds heard. Central nervous system: Alert and oriented. No focal neurological deficits. Extremities: Symmetric 5 x 5 power. Skin: No rashes, lesions or ulcers Psychiatry: Judgement and insight appear normal. Mood & affect appropriate.     Data Reviewed:   CBC:  Recent Labs Lab 09/20/2016 1216 09/14/16 0410  WBC 17.6* 14.0*  NEUTROABS 14.6*  --   HGB 8.1* 7.1*  HCT 25.0* 22.0*  MCV 91.2 92.1  PLT 471* 096*   Basic Metabolic Panel:  Recent Labs Lab 09/14/2016 1216 09/14/16 0410  NA 133* 130*  K 5.2* 5.0  CL 105 103  CO2 21* 19*  GLUCOSE 110* 127*  BUN 61* 71*  CREATININE 1.42* 1.76*  CALCIUM 9.8 8.9   GFR: Estimated Creatinine Clearance: 25.3 mL/min (A) (by C-G formula based on SCr of 1.76 mg/dL (H)). Liver Function Tests:  Recent Labs Lab 09/05/2016 1216  AST 15  ALT 17  ALKPHOS 72  BILITOT 0.4  PROT 5.1*  ALBUMIN 2.0*   No results for input(s): LIPASE, AMYLASE in the last 168 hours. No results for input(s): AMMONIA in the last 168 hours. Coagulation Profile:  Recent Labs Lab 09/23/2016 1251  INR 1.04   Cardiac Enzymes: No results for input(s): CKTOTAL, CKMB, CKMBINDEX, TROPONINI in the last 168 hours. BNP (last 3 results) No results for input(s): PROBNP in the last 8760 hours. HbA1C: No results for input(s): HGBA1C in the last 72 hours. CBG:  Recent Labs Lab 09/20/2016 2142 09/14/16 0858  GLUCAP 146* 107*   Lipid Profile: No results for input(s): CHOL, HDL, LDLCALC, TRIG, CHOLHDL, LDLDIRECT in the last 72 hours. Thyroid Function Tests: No results for input(s): TSH, T4TOTAL, FREET4, T3FREE, THYROIDAB in the last  72 hours. Anemia Panel: No results for input(s): VITAMINB12, FOLATE, FERRITIN, TIBC, IRON, RETICCTPCT in the last 72 hours. Sepsis Labs: No results for input(s): PROCALCITON, LATICACIDVEN in the last 168 hours.  Recent Results (from the past 240 hour(s))  MRSA PCR Screening     Status: None   Collection Time: 09/05/2016  9:00 PM  Result Value Ref Range Status   MRSA by PCR NEGATIVE NEGATIVE Final    Comment:        The GeneXpert MRSA Assay (FDA approved for NASAL specimens only), is one component of a comprehensive MRSA colonization surveillance program. It is not intended to diagnose MRSA infection nor to guide or monitor treatment for MRSA infections.          Radiology Studies: Dg Chest Portable 1 View  Result Date: 09/04/2016 CLINICAL DATA:  Weakness and shortness of breath. EXAM: PORTABLE CHEST 1 VIEW  COMPARISON:  Chest CT dated 08/22/2016 and chest x-rays dated 08/21/2016 and 04/19/2016 FINDINGS: There has been almost complete resolution of the cavitary infiltrate at the right lung base as well as the infiltrate medially in the right upper lobe. Irregular nodular density in the right upper lobe persists as demonstrated on the prior CT scan. No acute abnormalities. Heart size and vascularity are normal. PICC appears in good position. IMPRESSION: Marked improvement in the infiltrates in the right upper lobe medially and in the right lung base. Irregular nodular density in the right upper lobe persists. I recommend a CT scan of the chest without contrast in 2 months for further evaluation of this area. Electronically Signed   By: Lorriane Shire M.D.   On: 09/05/2016 12:32        Scheduled Meds: . acidophilus  1 capsule Oral Daily  . amLODipine  5 mg Oral Daily  . aspirin EC  325 mg Oral Daily  . cholecalciferol  1,000 Units Oral Daily  . docusate sodium  100 mg Oral BID  . enoxaparin (LOVENOX) injection  30 mg Subcutaneous Q24H  . feeding supplement (PRO-STAT 64)  30 mL  Oral BID  . ferrous sulfate  300 mg Oral Daily  . fluticasone furoate-vilanterol  1 puff Inhalation Daily  . insulin aspart  0-10 Units Subcutaneous BID  . ipratropium-albuterol  3 mL Nebulization Q6H  . mirtazapine  15 mg Oral QHS  . pantoprazole  40 mg Oral Daily  . polyethylene glycol  17 g Oral Daily  . predniSONE  10 mg Oral Q breakfast  . sodium chloride  1 spray Each Nare TID  . vitamin C  500 mg Oral Daily   Continuous Infusions: . sodium chloride 75 mL/hr at 09/14/16 0434  . ceFEPime (MAXIPIME) IV Stopped (09/14/16 1007)     LOS: 0 days    Time spent: 35 mins     Aniketh Huberty Arsenio Loader, MD [facility] Pager [phone]   If 7PM-7AM, please contact night-coverage [email or web address] Password [email or web address] 09/14/2016, 11:09 AM

## 2016-09-14 NOTE — Plan of Care (Signed)
Problem: Safety: Goal: Ability to remain free from injury will improve Outcome: Adequate for Discharge Pt  On moderate fall risk. Bed in lowest locked position, call bell within reach. Pt has no c/o pain this shift so far. Pt uses bedpan when needed. Pt resting quietly bed alarm on for safety purposes. Will continue to monitor pt

## 2016-09-14 NOTE — Consult Note (Signed)
Consult requested by: [facility],Dr. Reesa Chew Consult requested for: Pneumonia/COPD  HPI: This is a patient that I know well after seeing her in my office on several occasions in seeing her in the hospital during her last hospitalization. She has had significant COPD with chronic hypoxic respiratory failure and also hypercapnic respiratory failure. She has a history of hereditary hemochromatosis but she's actually been more anemic recently. She was admitted from May 28 until June 4 for healthcare associated pneumonia and some question of endocarditis. She had Pseudomonas in her sputum. She had some cavities. She was not able to have transesophageal echocardiogram so she's been treated with cefepime for 6 weeks. She would likely have needed cefepime for at least 4 weeks because of her pneumonia anyway. She says she still coughing. She's congested. She feels weak. She is eating better. No chest pain. No other new complaints. No bleeding. She has been more short of breath.  Past Medical History:  Diagnosis Date  . AVM (arteriovenous malformation) of colon   . COPD (chronic obstructive pulmonary disease) ([place])    O2 dependent, started 05/31/2012  . Diverticula of colon   . Esophagitis   . GERD (gastroesophageal reflux disease)   . 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 History of tobacco abuse   . Osteoporosis   . Pneumonia   . Polycythemia    Has had to have phlebotomies in the pas      She has family history of hemochromatosis and of COPD  Social History   Social History  . Marital status: Divorced    Spouse name: N/A  . Number of children: N/A  . Years of education: N/A   Social History Main Topics  . Smoking status: Former Smoker    Packs/day: 1.00    Years: 50.00    Quit date: 2012  . Smokeless tobacco: Never Used  . Alcohol use No  . Drug use: No  . Sexual activity: Not Asked   Other Topics Concern  . None   Social History Narrative  . None      ROS: Except as mentioned 10 point review of systems is negative    Objective: Vital signs in last 24 hours: Temp:  [97.5 F (36.4 C)-98.7 F (37.1 C)] 97.6 F (36.4 C) (06/21 0742) Pulse Rate:  [96-109] 105 (06/21 0742) Resp:  [12-20] 15 (06/21 0742) BP: (102-142)/(63-82) 121/78 (06/21 0742) SpO2:  [98 %-100 %] 99 % (06/21 0809) FiO2 (%):  [4 %] 4 % (06/20 2100) Weight:  [60.8 kg (134 lb)] 60.8 kg (134 lb) (06/20 1700) Weight change:  Last BM Date: 09/04/2016  Intake/Output from previous day: 06/20 0701 - 06/21 0700 In: 1085 [P.O.:340; I.V.:745] Out: -   PHYSICAL EXAM Constitutional: She is awake and alert and in no acute distress. Eyes: Pupils react EOMI. Ears nose mouth and throat: Her mucous membranes are dry. Her throat is clear. Hearing is grossly normal. Cardiovascular: Her heart is regular with a soft systolic murmur. She has bilateral edema of her legs. Respiratory: Her respiratory effort is normal. She is wearing nasal oxygen. She has rales bilaterally right more than left. Gastrointestinal: Her abdomen is soft with no masses. Skin: She has bruising on her left more than her right leg and on her arms bilaterally. Musculoskeletal: She has weakness of both upper and lower extremities. Neurological: No focal abnormalities. Psychiatric: Normal mood and affect  Lab Results: Basic Metabolic Panel:  Recent Labs  09/18/2016 1216 09/14/16 0410  NA  133* 130*  K 5.2* 5.0  CL 105 103  CO2 21* 19*  GLUCOSE 110* 127*  BUN 61* 71*  CREATININE 1.42* 1.76*  CALCIUM 9.8 8.9   Liver Function Tests:  Recent Labs  09/17/2016 1216  AST 15  ALT 17  ALKPHOS 72  BILITOT 0.4  PROT 5.1*  ALBUMIN 2.0*   No results for input(s): LIPASE, AMYLASE in the last 72 hours. No results for input(s): AMMONIA in the last 72 hours. CBC:  Recent Labs  08/25/2016 1216 09/14/16 0410  WBC 17.6* 14.0*  NEUTROABS 14.6*  --   HGB 8.1* 7.1*  HCT 25.0* 22.0*  MCV 91.2 92.1  PLT 471* 460*    Cardiac Enzymes: No results for input(s): CKTOTAL, CKMB, CKMBINDEX, TROPONINI in the last 72 hours. BNP: No results for input(s): PROBNP in the last 72 hours. D-Dimer: No results for input(s): DDIMER in the last 72 hours. CBG:  Recent Labs  09/01/2016 2142  GLUCAP 146*   Hemoglobin A1C: No results for input(s): HGBA1C in the last 72 hours. Fasting Lipid Panel: No results for input(s): CHOL, HDL, LDLCALC, TRIG, CHOLHDL, LDLDIRECT in the last 72 hours. Thyroid Function Tests: No results for input(s): TSH, T4TOTAL, FREET4, T3FREE, THYROIDAB in the last 72 hours. Anemia Panel: No results for input(s): VITAMINB12, FOLATE, FERRITIN, TIBC, IRON, RETICCTPCT in the last 72 hours. Coagulation:  Recent Labs  08/29/2016 1251  LABPROT 13.6  INR 1.04   Urine Drug Screen: Drugs of Abuse  No results found for: LABOPIA, COCAINSCRNUR, LABBENZ, AMPHETMU, THCU, LABBARB  Alcohol Level: No results for input(s): ETH in the last 72 hours. Urinalysis: No results for input(s): COLORURINE, LABSPEC, PHURINE, GLUCOSEU, HGBUR, BILIRUBINUR, KETONESUR, PROTEINUR, UROBILINOGEN, NITRITE, LEUKOCYTESUR in the last 72 hours.  Invalid input(s): APPERANCEUR Misc. Labs:   ABGS: No results for input(s): PHART, PO2ART, TCO2, HCO3 in the last 72 hours.  Invalid input(s): PCO2   MICROBIOLOGY: Recent Results (from the past 240 hour(s))  MRSA PCR Screening     Status: None   Collection Time: 09/18/2016  9:00 PM  Result Value Ref Range Status   MRSA by PCR NEGATIVE NEGATIVE Final    Comment:        The GeneXpert MRSA Assay (FDA approved for NASAL specimens only), is one component of a comprehensive MRSA colonization surveillance program. It is not intended to diagnose MRSA infection nor to guide or monitor treatment for MRSA infections.     Studies/Results: Dg Chest Portable 1 View  Result Date: 08/26/2016 CLINICAL DATA:  Weakness and shortness of breath. EXAM: PORTABLE CHEST 1 VIEW COMPARISON:   Chest CT dated 08/22/2016 and chest x-rays dated 08/21/2016 and 04/19/2016 FINDINGS: There has been almost complete resolution of the cavitary infiltrate at the right lung base as well as the infiltrate medially in the right upper lobe. Irregular nodular density in the right upper lobe persists as demonstrated on the prior CT scan. No acute abnormalities. Heart size and vascularity are normal. PICC appears in good position. IMPRESSION: Marked improvement in the infiltrates in the right upper lobe medially and in the right lung base. Irregular nodular density in the right upper lobe persists. I recommend a CT scan of the chest without contrast in 2 months for further evaluation of this area. Electronically Signed   By: Lorriane Shire M.D.   On: 09/10/2016 12:32    Medications:  Prior to Admission:  Prescriptions Prior to Admission  Medication Sig Dispense Refill Last Dose  . acetaminophen (TYLENOL) 325 MG tablet  Take 650 mg by mouth every 6 (six) hours as needed for moderate pain.   09/12/2016 at Unknown time  . Amino Acids-Protein Hydrolys (FEEDING SUPPLEMENT, PRO-STAT 64,) LIQD Take 30 mLs by mouth 2 (two) times daily.   09/12/2016 at Unknown time  . amLODipine (NORVASC) 5 MG tablet Take 5 mg by mouth daily.   09/12/2016 at Unknown time  . aspirin EC 325 MG tablet Take 325 mg by mouth daily.   08/21/2016 at Unknown time  . ceFEPIme 2 g in dextrose 5 % 50 mL Inject 2 g into the vein every 8 (eight) hours.   09/07/2016 at Unknown time  . Cholecalciferol (VITAMIN D-3) 1000 units CAPS Take 1 tablet by mouth daily.   09/12/2016 at Unknown time  . demeclocycline (DECLOMYCIN) 150 MG tablet Take 150 mg by mouth 2 (two) times daily.   09/12/2016 at Unknown time  . docusate sodium (COLACE) 100 MG capsule Take 100 mg by mouth 2 (two) times daily.   09/12/2016 at Unknown time  . ferrous sulfate 300 (60 Fe) MG/5ML syrup Take 300 mg by mouth daily.   09/12/2016 at Unknown time  . fluticasone furoate-vilanterol (BREO  ELLIPTA) 200-25 MCG/INH AEPB Inhale 1 puff into the lungs daily.   09/12/2016 at Unknown time  . furosemide (LASIX) 40 MG tablet Take 40 mg by mouth daily.    09/12/2016 at Unknown time  . guaiFENesin-dextromethorphan (ROBITUSSIN DM) 100-10 MG/5ML syrup Take 10 mLs by mouth every 4 (four) hours as needed for cough.   Past Week at Unknown time  . insulin aspart (NOVOLOG) 100 UNIT/ML injection Inject 0-10 Units into the skin 2 (two) times daily. Sliding Scale:: 60-200= 0 units 201-250= 2 units 251-300= 4 units 301-350= 6 units 351-400= 8 units 401-450=10units   09/12/2016 at Unknown time  . ipratropium-albuterol (DUONEB) 0.5-2.5 (3) MG/3ML SOLN Take 3 mLs by nebulization every 6 (six) hours.   09/22/2016 at Unknown time  . Lactobacillus (ACIDOPHILUS PO) Take 1 tablet by mouth daily.   09/17/2016 at Unknown time  . megestrol (MEGACE) 40 MG/ML suspension Take by mouth daily.   09/12/2016 at Unknown time  . mirtazapine (REMERON) 15 MG tablet Take 15 mg by mouth at bedtime.   09/12/2016 at Unknown time  . omeprazole (PRILOSEC) 20 MG capsule Take 20 mg by mouth daily.   09/12/2016 at Unknown time  . ondansetron (ZOFRAN) 4 MG tablet Take 4 mg by mouth every 8 (eight) hours as needed for nausea or vomiting.   Past Week at Unknown time  . OXYGEN Inhale 3 L into the lungs daily. During day and evening shifts   08/21/2016 at Unknown time  . potassium chloride (KLOR-CON) 20 MEQ packet Take 20 mEq by mouth daily.   09/12/2016 at Unknown time  . sodium chloride (OCEAN) 0.65 % SOLN nasal spray Place 1 spray into both nostrils 3 (three) times daily.   09/12/2016 at Unknown time  . vitamin C (ASCORBIC ACID) 500 MG tablet Take 500 mg by mouth daily.   09/12/2016 at Unknown time  . predniSONE (DELTASONE) 10 MG tablet Take 10 mg by mouth daily with breakfast.   Not Taking at Unknown time   Scheduled: . acidophilus  1 capsule Oral Daily  . amLODipine  5 mg Oral Daily  . aspirin EC  325 mg Oral Daily  . cholecalciferol   1,000 Units Oral Daily  . docusate sodium  100 mg Oral BID  . enoxaparin (LOVENOX) injection  30 mg Subcutaneous Q24H  .  feeding supplement (PRO-STAT 64)  30 mL Oral BID  . ferrous sulfate  300 mg Oral Daily  . fluticasone furoate-vilanterol  1 puff Inhalation Daily  . insulin aspart  0-10 Units Subcutaneous BID  . ipratropium-albuterol  3 mL Nebulization Q6H  . mirtazapine  15 mg Oral QHS  . pantoprazole  40 mg Oral Daily  . polyethylene glycol  17 g Oral Daily  . predniSONE  10 mg Oral Q breakfast  . sodium chloride  1 spray Each Nare TID  . vitamin C  500 mg Oral Daily   Continuous: . sodium chloride 75 mL/hr at 09/14/16 0434  . ceFEPime (MAXIPIME) IV     TTS:[id number] **OR** acetaminophen, bisacodyl, guaiFENesin-dextromethorphan, ondansetron **OR** ondansetron (ZOFRAN) IV, sodium phosphate  Assesment: She was admitted with what seems to be mostly dehydration. She has acute renal failure from that. She had healthcare associated pneumonia which was cavitary and that appears to be essentially resolved by chest x-ray. She will still need a total of 6 weeks of IV antibiotics because of her concern for endocarditis. She also has an irregular lesion in the right upper lobe and she will need a CT scan approximately August 30. This could represent carcinoma. She has severe malnutrition which is ongoing although she is eating better. Principal Problem:   Acute renal failure (ARF) (HCC) Active Problems:   COPD (chronic obstructive pulmonary disease) (HCC)   Anemia   Thrombocytosis (HCC)   Leukocytosis   Hypoalbuminemia   Hyperglycemia    Plan: Continue treatments continue IV antibiotics for the total of 6 weeks    LOS: 0 days   Tiffinie Caillier L 09/14/2016, 8:28 AM

## 2016-09-14 NOTE — Progress Notes (Signed)
Late entry from 2100: Pt refusing TED hose because she says her legs hurt all the time and can't be touched. Pt educated on DVT prophylaxis and states she understands but she is w/c bound at home and does not have to wear those at the nursing home. Re educated pt, pt verbalized understanding again and still declined. Will continue to monitor pt

## 2016-09-15 DIAGNOSIS — E871 Hypo-osmolality and hyponatremia: Secondary | ICD-10-CM

## 2016-09-15 DIAGNOSIS — E43 Unspecified severe protein-calorie malnutrition: Secondary | ICD-10-CM

## 2016-09-15 LAB — BASIC METABOLIC PANEL
ANION GAP: 8 (ref 5–15)
BUN: 78 mg/dL — ABNORMAL HIGH (ref 6–20)
CO2: 17 mmol/L — AB (ref 22–32)
Calcium: 8.2 mg/dL — ABNORMAL LOW (ref 8.9–10.3)
Chloride: 103 mmol/L (ref 101–111)
Creatinine, Ser: 2.31 mg/dL — ABNORMAL HIGH (ref 0.44–1.00)
GFR calc Af Amer: 23 mL/min — ABNORMAL LOW (ref >60)
GFR calc non Af Amer: 20 mL/min — ABNORMAL LOW (ref >60)
GLUCOSE: 85 mg/dL (ref 65–99)
POTASSIUM: 4.9 mmol/L (ref 3.5–5.1)
Sodium: 128 mmol/L — ABNORMAL LOW (ref 135–145)

## 2016-09-15 LAB — GLUCOSE, CAPILLARY
GLUCOSE-CAPILLARY: 149 mg/dL — AB (ref 65–99)
Glucose-Capillary: 90 mg/dL (ref 65–99)

## 2016-09-15 LAB — URIC ACID, RANDOM URINE: Uric Acid, Urine: 10.6 mg/dL

## 2016-09-15 MED ORDER — FAMOTIDINE 20 MG PO TABS
20.0000 mg | ORAL_TABLET | Freq: Once | ORAL | Status: AC
  Administered 2016-09-15: 20 mg via ORAL
  Filled 2016-09-15: qty 1

## 2016-09-15 MED ORDER — CALCIUM CARBONATE ANTACID 500 MG PO CHEW
1.0000 | CHEWABLE_TABLET | Freq: Three times a day (TID) | ORAL | Status: DC | PRN
  Administered 2016-09-16: 200 mg via ORAL
  Filled 2016-09-15: qty 1

## 2016-09-15 MED ORDER — SODIUM CHLORIDE 0.9 % IV SOLN
INTRAVENOUS | Status: DC
  Administered 2016-09-15 (×2): via INTRAVENOUS

## 2016-09-15 MED ORDER — FUROSEMIDE 10 MG/ML IJ SOLN
40.0000 mg | Freq: Two times a day (BID) | INTRAMUSCULAR | Status: DC
  Administered 2016-09-15 – 2016-09-17 (×4): 40 mg via INTRAVENOUS
  Filled 2016-09-15 (×4): qty 4

## 2016-09-15 NOTE — Progress Notes (Signed)
Subjective: She says she feels better. She wants to go back to the skilled care facility. She is just getting blood drawn now. She says her breathing is okay. No other new complaints. She is coughing some.  Objective: Vital signs in last 24 hours: Temp:  [97.7 F (36.5 C)-98.1 F (36.7 C)] 97.7 F (36.5 C) (06/22 0655) Pulse Rate:  [99-116] 99 (06/22 0655) Resp:  [18-19] 18 (06/22 0655) BP: (111-144)/(56-77) 114/56 (06/22 0655) SpO2:  [98 %-100 %] 98 % (06/22 0742) Weight change:  Last BM Date: 09/02/2016  Intake/Output from previous day: 06/21 0701 - 06/22 0700 In: 2682.5 [P.O.:800; I.V.:1832.5; IV Piggyback:50] Out: -   PHYSICAL EXAM General appearance: alert, cooperative and no distress Resp: rhonchi bilaterally Cardio: regular rate and rhythm, S1, S2 normal, no murmur, click, rub or gallop GI: soft, non-tender; bowel sounds normal; no masses,  no organomegaly Extremities: She has significant edema of her legs Her skin is thin but warm and dry  Lab Results:  Results for orders placed or performed during the hospital encounter of 09/06/2016 (from the past 48 hour(s))  CBC with Differential     Status: Abnormal   Collection Time: 09/17/2016 12:16 PM  Result Value Ref Range   WBC 17.6 (H) 4.0 - 10.5 K/uL   RBC 2.74 (L) 3.87 - 5.11 MIL/uL   Hemoglobin 8.1 (L) 12.0 - 15.0 g/dL   HCT 25.0 (L) 36.0 - 46.0 %   MCV 91.2 78.0 - 100.0 fL   MCH 29.6 26.0 - 34.0 pg   MCHC 32.4 30.0 - 36.0 g/dL   RDW 19.2 (H) 11.5 - 15.5 %   Platelets 471 (H) 150 - 400 K/uL   Neutrophils Relative % 83 %   Neutro Abs 14.6 (H) 1.7 - 7.7 K/uL   Lymphocytes Relative 9 %   Lymphs Abs 1.5 0.7 - 4.0 K/uL   Monocytes Relative 7 %   Monocytes Absolute 1.3 (H) 0.1 - 1.0 K/uL   Eosinophils Relative 1 %   Eosinophils Absolute 0.2 0.0 - 0.7 K/uL   Basophils Relative 0 %   Basophils Absolute 0.0 0.0 - 0.1 K/uL  Comprehensive metabolic panel     Status: Abnormal   Collection Time: 09/05/2016 12:16 PM  Result  Value Ref Range   Sodium 133 (L) 135 - 145 mmol/L   Potassium 5.2 (H) 3.5 - 5.1 mmol/L   Chloride 105 101 - 111 mmol/L   CO2 21 (L) 22 - 32 mmol/L   Glucose, Bld 110 (H) 65 - 99 mg/dL   BUN 61 (H) 6 - 20 mg/dL   Creatinine, Ser 1.42 (H) 0.44 - 1.00 mg/dL   Calcium 9.8 8.9 - 10.3 mg/dL   Total Protein 5.1 (L) 6.5 - 8.1 g/dL   Albumin 2.0 (L) 3.5 - 5.0 g/dL   AST 15 15 - 41 U/L   ALT 17 14 - 54 U/L   Alkaline Phosphatase 72 38 - 126 U/L   Total Bilirubin 0.4 0.3 - 1.2 mg/dL   GFR calc non Af Amer 36 (L) >60 mL/min   GFR calc Af Amer 42 (L) >60 mL/min    Comment: (NOTE) The eGFR has been calculated using the CKD EPI equation. This calculation has not been validated in all clinical situations. eGFR's persistently <60 mL/min signify possible Chronic Kidney Disease.    Anion gap 7 5 - 15  Type and screen [facility]     Status: None   Collection Time: 09/23/2016 12:16 PM  Result  Value Ref Range   ABO/RH(D) A POS    Antibody Screen NEG    Sample Expiration 09/16/2016   Osmolality     Status: None   Collection Time: 09/18/2016 12:16 PM  Result Value Ref Range   Osmolality 295 275 - 295 mOsm/kg    Comment: Performed at [facility], [place] [address] [place], [place] [address]  Protime-INR     Status: None   Collection Time: 09/21/2016 12:51 PM  Result Value Ref Range   Prothrombin Time 13.6 11.4 - 15.2 seconds   INR 1.04   APTT     Status: None   Collection Time: 08/29/2016 12:51 PM  Result Value Ref Range   aPTT 24 24 - 36 seconds  MRSA PCR Screening     Status: None   Collection Time: 09/17/2016  9:00 PM  Result Value Ref Range   MRSA by PCR NEGATIVE NEGATIVE    Comment:        The GeneXpert MRSA Assay (FDA approved for NASAL specimens only), is one component of a comprehensive MRSA colonization surveillance program. It is not intended to diagnose MRSA infection nor to guide or monitor treatment for MRSA infections.   Glucose, capillary     Status: Abnormal    Collection Time: 09/14/2016  9:42 PM  Result Value Ref Range   Glucose-Capillary 146 (H) 65 - 99 mg/dL   Comment 1 Notify RN    Comment 2 Document in Chart   Basic metabolic panel     Status: Abnormal   Collection Time: 09/14/16  4:10 AM  Result Value Ref Range   Sodium 130 (L) 135 - 145 mmol/L   Potassium 5.0 3.5 - 5.1 mmol/L   Chloride 103 101 - 111 mmol/L   CO2 19 (L) 22 - 32 mmol/L   Glucose, Bld 127 (H) 65 - 99 mg/dL   BUN 71 (H) 6 - 20 mg/dL   Creatinine, Ser 1.76 (H) 0.44 - 1.00 mg/dL   Calcium 8.9 8.9 - 10.3 mg/dL   GFR calc non Af Amer 28 (L) >60 mL/min   GFR calc Af Amer 32 (L) >60 mL/min    Comment: (NOTE) The eGFR has been calculated using the CKD EPI equation. This calculation has not been validated in all clinical situations. eGFR's persistently <60 mL/min signify possible Chronic Kidney Disease.    Anion gap 8 5 - 15  CBC     Status: Abnormal   Collection Time: 09/14/16  4:10 AM  Result Value Ref Range   WBC 14.0 (H) 4.0 - 10.5 K/uL   RBC 2.39 (L) 3.87 - 5.11 MIL/uL   Hemoglobin 7.1 (L) 12.0 - 15.0 g/dL   HCT 22.0 (L) 36.0 - 46.0 %   MCV 92.1 78.0 - 100.0 fL   MCH 29.7 26.0 - 34.0 pg   MCHC 32.3 30.0 - 36.0 g/dL   RDW 19.2 (H) 11.5 - 15.5 %   Platelets 460 (H) 150 - 400 K/uL  Creatinine, urine, random     Status: None   Collection Time: 09/14/16  7:52 AM  Result Value Ref Range   Creatinine, Urine 36.63 mg/dL  Sodium, urine, random     Status: None   Collection Time: 09/14/16  7:52 AM  Result Value Ref Range   Sodium, Ur 18 mmol/L  Osmolality, urine     Status: None   Collection Time: 09/14/16  7:52 AM  Result Value Ref Range   Osmolality, Ur 315 300 - 900 mOsm/kg  Comment: Performed at [facility], [place] [address] [place], [place] [address]  Glucose, capillary     Status: Abnormal   Collection Time: 09/14/16  8:58 AM  Result Value Ref Range   Glucose-Capillary 107 (H) 65 - 99 mg/dL   Comment 1 Notify RN   Glucose, capillary     Status:  Abnormal   Collection Time: 09/14/16 10:18 PM  Result Value Ref Range   Glucose-Capillary 113 (H) 65 - 99 mg/dL   Comment 1 Notify RN    Comment 2 Document in Chart     ABGS No results for input(s): PHART, PO2ART, TCO2, HCO3 in the last 72 hours.  Invalid input(s): PCO2 CULTURES Recent Results (from the past 240 hour(s))  MRSA PCR Screening     Status: None   Collection Time: 09/22/2016  9:00 PM  Result Value Ref Range Status   MRSA by PCR NEGATIVE NEGATIVE Final    Comment:        The GeneXpert MRSA Assay (FDA approved for NASAL specimens only), is one component of a comprehensive MRSA colonization surveillance program. It is not intended to diagnose MRSA infection nor to guide or monitor treatment for MRSA infections.    Studies/Results: US Renal  Result Date: 09/14/2016 CLINICAL DATA:  Acute renal injury/insufficiency EXAM: RENAL ULTRASOUND COMPARISON:  None. FINDINGS: Right Kidney: Length: 11.0 cm. Echogenicity and renal cortical thickness are within normal limits. No mass, perinephric fluid, or hydronephrosis visualized. No sonographically demonstrable calculus or ureterectasis. Renal pyramids are rather prominent, a presumed anatomic variant in this age group. Left Kidney: Length: 12.4 cm. Echogenicity and renal cortical thickness are within normal limits. No mass, perinephric fluid, or hydronephrosis visualized. No sonographically demonstrable calculus or ureterectasis. Renal pyramids are rather prominent, a presumed anatomic variant in this age group. Bladder: Bladder completely empty and could not be interrogated. IMPRESSION: Prominence of renal pyramids bilaterally in a symmetric manner. Suspect anatomic variant. Study otherwise unremarkable. Note that the urinary bladder cannot be interrogated he to completely emptied state. Electronically Signed   By: Lowella Grip III M.D.   On: 09/14/2016 12:39   Dg Chest Portable 1 View  Result Date: 09/23/2016 CLINICAL DATA:   Weakness and shortness of breath. EXAM: PORTABLE CHEST 1 VIEW COMPARISON:  Chest CT dated 08/22/2016 and chest x-rays dated 08/21/2016 and 04/19/2016 FINDINGS: There has been almost complete resolution of the cavitary infiltrate at the right lung base as well as the infiltrate medially in the right upper lobe. Irregular nodular density in the right upper lobe persists as demonstrated on the prior CT scan. No acute abnormalities. Heart size and vascularity are normal. PICC appears in good position. IMPRESSION: Marked improvement in the infiltrates in the right upper lobe medially and in the right lung base. Irregular nodular density in the right upper lobe persists. I recommend a CT scan of the chest without contrast in 2 months for further evaluation of this area. Electronically Signed   By: Lorriane Shire M.D.   On: 08/28/2016 12:32    Medications:  Prior to Admission:  Prescriptions Prior to Admission  Medication Sig Dispense Refill Last Dose  . acetaminophen (TYLENOL) 325 MG tablet Take 650 mg by mouth every 6 (six) hours as needed for moderate pain.   09/12/2016 at Unknown time  . Amino Acids-Protein Hydrolys (FEEDING SUPPLEMENT, PRO-STAT 64,) LIQD Take 30 mLs by mouth 2 (two) times daily.   09/12/2016 at Unknown time  . amLODipine (NORVASC) 5 MG tablet Take 5 mg  by mouth daily.   09/12/2016 at Unknown time  . aspirin EC 325 MG tablet Take 325 mg by mouth daily.   08/21/2016 at Unknown time  . ceFEPIme 2 g in dextrose 5 % 50 mL Inject 2 g into the vein every 8 (eight) hours.   08/26/2016 at Unknown time  . Cholecalciferol (VITAMIN D-3) 1000 units CAPS Take 1 tablet by mouth daily.   09/12/2016 at Unknown time  . demeclocycline (DECLOMYCIN) 150 MG tablet Take 150 mg by mouth 2 (two) times daily.   09/12/2016 at Unknown time  . docusate sodium (COLACE) 100 MG capsule Take 100 mg by mouth 2 (two) times daily.   09/12/2016 at Unknown time  . ferrous sulfate 300 (60 Fe) MG/5ML syrup Take 300 mg by mouth daily.    09/12/2016 at Unknown time  . fluticasone furoate-vilanterol (BREO ELLIPTA) 200-25 MCG/INH AEPB Inhale 1 puff into the lungs daily.   09/12/2016 at Unknown time  . furosemide (LASIX) 40 MG tablet Take 40 mg by mouth daily.    09/12/2016 at Unknown time  . guaiFENesin-dextromethorphan (ROBITUSSIN DM) 100-10 MG/5ML syrup Take 10 mLs by mouth every 4 (four) hours as needed for cough.   Past Week at Unknown time  . insulin aspart (NOVOLOG) 100 UNIT/ML injection Inject 0-10 Units into the skin 2 (two) times daily. Sliding Scale:: 60-200= 0 units 201-250= 2 units 251-300= 4 units 301-350= 6 units 351-400= 8 units 401-450=10units   09/12/2016 at Unknown time  . ipratropium-albuterol (DUONEB) 0.5-2.5 (3) MG/3ML SOLN Take 3 mLs by nebulization every 6 (six) hours.   08/27/2016 at Unknown time  . Lactobacillus (ACIDOPHILUS PO) Take 1 tablet by mouth daily.   09/03/2016 at Unknown time  . megestrol (MEGACE) 40 MG/ML suspension Take by mouth daily.   09/12/2016 at Unknown time  . mirtazapine (REMERON) 15 MG tablet Take 15 mg by mouth at bedtime.   09/12/2016 at Unknown time  . omeprazole (PRILOSEC) 20 MG capsule Take 20 mg by mouth daily.   09/12/2016 at Unknown time  . ondansetron (ZOFRAN) 4 MG tablet Take 4 mg by mouth every 8 (eight) hours as needed for nausea or vomiting.   Past Week at Unknown time  . OXYGEN Inhale 3 L into the lungs daily. During day and evening shifts   08/21/2016 at Unknown time  . potassium chloride (KLOR-CON) 20 MEQ packet Take 20 mEq by mouth daily.   09/12/2016 at Unknown time  . sodium chloride (OCEAN) 0.65 % SOLN nasal spray Place 1 spray into both nostrils 3 (three) times daily.   09/12/2016 at Unknown time  . vitamin C (ASCORBIC ACID) 500 MG tablet Take 500 mg by mouth daily.   09/12/2016 at Unknown time  . predniSONE (DELTASONE) 10 MG tablet Take 10 mg by mouth daily with breakfast.   Not Taking at Unknown time   Scheduled: . acidophilus  1 capsule Oral Daily  . amLODipine  5 mg  Oral Daily  . aspirin EC  325 mg Oral Daily  . cholecalciferol  1,000 Units Oral Daily  . docusate sodium  100 mg Oral BID  . enoxaparin (LOVENOX) injection  30 mg Subcutaneous Q24H  . feeding supplement  1 Container Oral TID BM  . feeding supplement (PRO-STAT 64)  30 mL Oral BID  . ferrous sulfate  300 mg Oral Daily  . fluticasone furoate-vilanterol  1 puff Inhalation Daily  . insulin aspart  0-10 Units Subcutaneous BID  . ipratropium-albuterol  3 mL Nebulization TID  .  mirtazapine  15 mg Oral QHS  . pantoprazole  40 mg Oral Daily  . polyethylene glycol  17 g Oral Daily  . predniSONE  40 mg Oral Q breakfast  . sodium chloride  1 spray Each Nare TID  . vitamin C  500 mg Oral Daily   Continuous: . sodium chloride 75 mL/hr at 09/15/16 0610  . ceFEPime (MAXIPIME) IV Stopped (09/14/16 1007)   SPZ:[id number] **OR** acetaminophen, albuterol, ALPRAZolam, bisacodyl, guaiFENesin-dextromethorphan, ondansetron **OR** ondansetron (ZOFRAN) IV, sodium phosphate  Assesment: She was admitted with acute renal failure probably dehydration. She has COPD at baseline which is pretty severe and she has long-standing chronic hypoxic respiratory failure on oxygen for several years. She is malnourished with low albumin level. She had healthcare associated pneumonia which was cavitary and it had several foci so there was worry that she might have had endocarditis and septic emboli but she was not able to get TEE done so she's going to be treated for 6 weeks with IV antibiotics. She would need 4 weeks of IV antibiotics simply for the pneumonia. Principal Problem:   Acute renal failure (ARF) (HCC) Active Problems:   COPD (chronic obstructive pulmonary disease) (HCC)   Anemia   Thrombocytosis (HCC)   Leukocytosis   Hypoalbuminemia   Hyperglycemia   AKI (acute kidney injury) ([place])    Plan: Continue current treatments    LOS: 1 day   Leah Dalton L 09/15/2016, 8:05 AM

## 2016-09-15 NOTE — Progress Notes (Signed)
PROGRESS NOTE    Leah Dalton  CWC:[id number] DOB: 11-16-1945 DOA: 08/29/2016 PCP: Crisoforo Oxford, MD   Brief Narrative:   71 year old with history of GERD, hereditary hemochromatosis, polycythemia with history of phlebotomy, COPD came to the ER after having found abnormal labs at the nursing home. Apparently she was told her hemoglobin was less than 7 therefore she was sent here but upon lab work here she was noted to have stable hemoglobin but her creatinine was elevated to 1.4 from her baseline of 0.3. IV fluid hydration was started.  Assessment & Plan:   Principal Problem:   Acute renal failure (ARF) (HCC) Active Problems:   COPD (chronic obstructive pulmonary disease) (HCC)   Anemia   Thrombocytosis (HCC)   Leukocytosis   Hypoalbuminemia   Hyperglycemia   AKI (acute kidney injury) ([place])   Protein-calorie malnutrition, severe   Acute kidney injury; worsening  Dehydration/prerenal vs ATN?; could also be septic emboli. -Likely prerenal in nature versus medication related -Continue IV fluid hydration. Monitor urine output -urine electrolytes- dont and renal ultrasound - nothing acute; shows anatomic variant kidneys -Consulted nephrology this morning for assistance -Demeclocycline has been stopped. Not sure why he was started at the time of discharge during the last admission  Possible mild exacerbation of mild intermittent COPD with history of recent HCAP; stable -Continue nebulizers every 6 hours, Breo, Robitussin -Unable to get CT of the chest at this time given her renal function but at this time we can hold off. But she will need 1 after 4-6 weeks of completion of her antibiotics. Last dose of antibiotic on 10/02/2016 -Continue cefepime at this time.  -Supplemental oxygen as needed. Uses 3 L at home   Hyponatremia -Possible dehydration. Closely monitor with IVF hydration  Anemia, likely chronic disease -Hemoglobin is 7.1. Repeat tomorrow am.    Hypoalbuminemia -Likely secondary to poor nutrition status -Encourage oral diet  Leukocytosis has mildly improved -Continue to monitor  Due to generalized weakness PT eval ordered, pending. Likely from deconditioning   DVT prophylaxis:  renally dosed Lovenox  Code Status:  DO NOT RESUSCITATE  Family Communication:   none at bedside  Disposition Plan: Maintain inpatient stay until her renal function shows improvement.   Consultants:   Pulmonary  Nephrology  Procedures:   None  Antimicrobials:   Cefepime   Subjective: Patient states in terms of her breathing she feels a little better and does not have any other new complaints. She is eager to go home.  Objective: Vitals:   09/14/16 1952 09/14/16 2216 09/15/16 0655 09/15/16 0742  BP:  111/77 (!) 114/56   Pulse:  (!) 105 99   Resp:  18 18   Temp:  97.7 F (36.5 C) 97.7 F (36.5 C)   TempSrc:  Oral Oral   SpO2: 100% 100% 100% 98%  Weight:      Height:        Intake/Output Summary (Last 24 hours) at 09/15/16 0948 Last data filed at 09/15/16 0337  Gross per 24 hour  Intake           2562.5 ml  Output                0 ml  Net           2562.5 ml   Filed Weights   09/18/2016 1153 09/12/2016 1700  Weight: 60.8 kg (134 lb) 60.8 kg (134 lb)    Examination:  General exam: Appears calm and comfortable , generally weak and  frail-appearing  Respiratory system: Mild diffuse diminished breath sounds with some coarse sounds Cardiovascular system: S1 & S2 heard, RRR. No JVD, murmurs, rubs, gallops or clicks. No pedal edema. Gastrointestinal system: Abdomen is nondistended, soft and nontender. No organomegaly or masses felt. Normal bowel sounds heard. Central nervous system: Alert and oriented. No focal neurological deficits. Extremities: Symmetric 5 x 5 power. Skin: No rashes, lesions or ulcers Psychiatry: Judgement and insight appear normal. Mood & affect appropriate.     Data Reviewed:   CBC:  Recent  Labs Lab 09/15/2016 1216 09/14/16 0410  WBC 17.6* 14.0*  NEUTROABS 14.6*  --   HGB 8.1* 7.1*  HCT 25.0* 22.0*  MCV 91.2 92.1  PLT 471* 737*   Basic Metabolic Panel:  Recent Labs Lab 09/07/2016 1216 09/14/16 0410 09/15/16 0745  NA 133* 130* 128*  K 5.2* 5.0 4.9  CL 105 103 103  CO2 21* 19* 17*  GLUCOSE 110* 127* 85  BUN 61* 71* 78*  CREATININE 1.42* 1.76* 2.31*  CALCIUM 9.8 8.9 8.2*   GFR: Estimated Creatinine Clearance: 19.3 mL/min (A) (by C-G formula based on SCr of 2.31 mg/dL (H)). Liver Function Tests:  Recent Labs Lab 09/08/2016 1216  AST 15  ALT 17  ALKPHOS 72  BILITOT 0.4  PROT 5.1*  ALBUMIN 2.0*   No results for input(s): LIPASE, AMYLASE in the last 168 hours. No results for input(s): AMMONIA in the last 168 hours. Coagulation Profile:  Recent Labs Lab 08/25/2016 1251  INR 1.04   Cardiac Enzymes: No results for input(s): CKTOTAL, CKMB, CKMBINDEX, TROPONINI in the last 168 hours. BNP (last 3 results) No results for input(s): PROBNP in the last 8760 hours. HbA1C: No results for input(s): HGBA1C in the last 72 hours. CBG:  Recent Labs Lab 09/23/2016 2142 09/14/16 0858 09/14/16 2218  GLUCAP 146* 107* 113*   Lipid Profile: No results for input(s): CHOL, HDL, LDLCALC, TRIG, CHOLHDL, LDLDIRECT in the last 72 hours. Thyroid Function Tests: No results for input(s): TSH, T4TOTAL, FREET4, T3FREE, THYROIDAB in the last 72 hours. Anemia Panel: No results for input(s): VITAMINB12, FOLATE, FERRITIN, TIBC, IRON, RETICCTPCT in the last 72 hours. Sepsis Labs: No results for input(s): PROCALCITON, LATICACIDVEN in the last 168 hours.  Recent Results (from the past 240 hour(s))  MRSA PCR Screening     Status: None   Collection Time: 09/23/2016  9:00 PM  Result Value Ref Range Status   MRSA by PCR NEGATIVE NEGATIVE Final    Comment:        The GeneXpert MRSA Assay (FDA approved for NASAL specimens only), is one component of a comprehensive MRSA  colonization surveillance program. It is not intended to diagnose MRSA infection nor to guide or monitor treatment for MRSA infections.          Radiology Studies: US Renal  Result Date: 09/14/2016 CLINICAL DATA:  Acute renal injury/insufficiency EXAM: RENAL ULTRASOUND COMPARISON:  None. FINDINGS: Right Kidney: Length: 11.0 cm. Echogenicity and renal cortical thickness are within normal limits. No mass, perinephric fluid, or hydronephrosis visualized. No sonographically demonstrable calculus or ureterectasis. Renal pyramids are rather prominent, a presumed anatomic variant in this age group. Left Kidney: Length: 12.4 cm. Echogenicity and renal cortical thickness are within normal limits. No mass, perinephric fluid, or hydronephrosis visualized. No sonographically demonstrable calculus or ureterectasis. Renal pyramids are rather prominent, a presumed anatomic variant in this age group. Bladder: Bladder completely empty and could not be interrogated. IMPRESSION: Prominence of renal pyramids bilaterally in a symmetric manner.  Suspect anatomic variant. Study otherwise unremarkable. Note that the urinary bladder cannot be interrogated he to completely emptied state. Electronically Signed   By: Lowella Grip III M.D.   On: 09/14/2016 12:39   Dg Chest Portable 1 View  Result Date: 08/27/2016 CLINICAL DATA:  Weakness and shortness of breath. EXAM: PORTABLE CHEST 1 VIEW COMPARISON:  Chest CT dated 08/22/2016 and chest x-rays dated 08/21/2016 and 04/19/2016 FINDINGS: There has been almost complete resolution of the cavitary infiltrate at the right lung base as well as the infiltrate medially in the right upper lobe. Irregular nodular density in the right upper lobe persists as demonstrated on the prior CT scan. No acute abnormalities. Heart size and vascularity are normal. PICC appears in good position. IMPRESSION: Marked improvement in the infiltrates in the right upper lobe medially and in the right  lung base. Irregular nodular density in the right upper lobe persists. I recommend a CT scan of the chest without contrast in 2 months for further evaluation of this area. Electronically Signed   By: Lorriane Shire M.D.   On: 09/03/2016 12:32        Scheduled Meds: . acidophilus  1 capsule Oral Daily  . amLODipine  5 mg Oral Daily  . aspirin EC  325 mg Oral Daily  . cholecalciferol  1,000 Units Oral Daily  . docusate sodium  100 mg Oral BID  . enoxaparin (LOVENOX) injection  30 mg Subcutaneous Q24H  . feeding supplement  1 Container Oral TID BM  . feeding supplement (PRO-STAT 64)  30 mL Oral BID  . ferrous sulfate  300 mg Oral Daily  . fluticasone furoate-vilanterol  1 puff Inhalation Daily  . insulin aspart  0-10 Units Subcutaneous BID  . ipratropium-albuterol  3 mL Nebulization TID  . mirtazapine  15 mg Oral QHS  . pantoprazole  40 mg Oral Daily  . polyethylene glycol  17 g Oral Daily  . predniSONE  40 mg Oral Q breakfast  . sodium chloride  1 spray Each Nare TID  . vitamin C  500 mg Oral Daily   Continuous Infusions: . sodium chloride 75 mL/hr at 09/15/16 0610  . ceFEPime (MAXIPIME) IV 1 g (09/15/16 0829)     LOS: 1 day    Time spent: 35 mins     Jedrek Dinovo Arsenio Loader, MD [facility] Pager [phone]   If 7PM-7AM, please contact night-coverage [email or web address] Password [facility] 09/15/2016, 9:48 AM

## 2016-09-15 NOTE — Care Management Note (Signed)
Case Management Note  Patient Details  Name: Leah Dalton MRN: [id number] Date of Birth: November 29, 1945  Subjective/Objective:                  Pt coming from [facility]. No CM needs.   Action/Plan: CSW will make arrangements for return to facility.   Expected Discharge Date:     09/16/2016             Expected Discharge Plan:  [place]  In-House Referral:  Clinical Social Work  Discharge planning Services  CM Consult  Post Acute Care Choice:  NA Choice offered to:  NA  Status of Service:  Completed, signed off   Sherald Barge, RN 09/15/2016, 8:00 AM

## 2016-09-15 NOTE — Clinical Social Work Note (Signed)
Please see below most recent psychosocial and admission.  Patient to return to SNF with PICC Line IV antibiotics 4 weeks. FL2 updated.  Will return at discharge.  Lane Hacker, MSW Clinical Social Work: System Wide Float Coverage for :  [phone]    Clinical Social Work Assessment  Patient Details  Name: Leah Dalton MRN: [id number] Date of Birth: 1945-05-02  Date of referral:  08/25/16               Reason for consult:  Discharge Planning                           Permission sought to share information with:  Case Manager, Facility Sport and exercise psychologist, Family Supports Permission granted to share information::  Yes, Verbal Permission Granted             Name::                   Agency::  [facility]             Relationship::  [place] POA             Contact Information:     Housing/Transportation Living arrangements for the past 2 months:  [place] of Information:  Patient, Medical Team, Tourist information centre manager, Adult Children, Facility Patient Interpreter Needed:  None Criminal Activity/Legal Involvement Pertinent to Current Situation/Hospitalization:  No - Comment as needed Significant Relationships:  Adult Children, Other Family Members, Community Support Lives with:  Facility Resident Do you feel safe going back to the place where you live?  Yes Need for family participation in patient care:  Yes (Comment)  Care giving concerns:  Patient is a current resident at [facility].  Plan is to return at discharge with no care giving concerns. Palliative met with patient and still addressing long term goals of care with patient and POA. Patient  shares that she became a resident of [name] December 20. She also shares that she has been unable to walk since January 2018.    Social Worker assessment / plan:  Plan remains for patient to return to [facility] at discharge. Confirmed with  facility and no barriers. Will update FL2 and assist with discharge back to nursing facility once medically stable. She was admitted for PNA and receiving current treatments.  Employment status:  Retired Forensic scientist:  Medicare PT Recommendations:  Not assessed at this time Information / Referral to community resources:     Patient/Family's Response to care:  Understanding, involved in care  Patient/Family's Understanding of and Emotional Response to Diagnosis, Current Treatment, and Prognosis:  Patient able to verbalize and understand current treatments along with being active in [place] with palliative regarding her plans.  Emotional Assessment Appearance:  Appears stated age Attitude/Demeanor/Rapport:    Affect (typically observed):  Accepting, Adaptable Orientation:  Oriented to Self, Oriented to Place Alcohol / Substance use:  Not Applicable Psych involvement (Current and /or in the community):  No (Comment)  Discharge Needs  Concerns to be addressed:  No discharge needs identified Readmission within the last 30 days:  No Current discharge risk:  None Barriers to Discharge:  No Barriers Identified   Lilly Cove, LCSW 08/25/2016, 9:11 AM

## 2016-09-15 NOTE — Care Management Important Message (Signed)
Important Message  Patient Details  Name: Leah Dalton MRN: [id number] Date of Birth: November 30, 1945   Medicare Important Message Given:  Yes    Sherald Barge, RN 09/15/2016, 11:56 AM

## 2016-09-15 NOTE — Consult Note (Signed)
Reason for Consult: Acute kidney injury Referring Physician: Dr. Arlina Robes is an 71 y.o. female.  HPI: She is a patient who has history of polycythemia, a retiree hemochromatosis, history of COPD with hypercapnic respiratory failure presently came in because of weakness, feeling tired and she was found to have anemia and worsening of renal failure. Even though  patient has leg swelling for years she denies any history of renal failure or kidney stone. Patient was recently admitted with history of pneumonia and possible endocarditis. Since then patient was put on antibiotics and discharged to nursing home to be followed as an outpatient. Presently patient denies any nausea or vomiting. She says she is somewhat feeling better. Still she has some difficulty breathing but usually she has this problem for years and she is on oxygen and CPAP at home.   Past Medical History:  Diagnosis Date  . AVM (arteriovenous malformation) of colon   . COPD (chronic obstructive pulmonary disease) ([place])    O2 dependent, started 05/31/2012  . Diverticula of colon   . Esophagitis   . GERD (gastroesophageal reflux disease)   . 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 History of tobacco abuse   . Osteoporosis   . Pneumonia   . Polycythemia    Has had to have phlebotomies in the pas    Past Surgical History:  Procedure Laterality Date  . ABDOMINAL HYSTERECTOMY    . CATARACT EXTRACTION    . 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 Engorged internal hemorrhoids; colonic diverticulosis.  Marland Kitchen ESOPHAGOGASTRODUODENOSCOPY N/A 02/23/2014   RMR:  Ulcerative/erosive esophagitis. small hiatal hernia    History reviewed. No pertinent family history.  Social History:  reports that she quit smoking about 6 years ago. She has a 50.00 pack-year smoking history. She has never used smokeless tobacco. She reports that she does not drink alcohol  or use drugs.  Allergies:  Allergies  Allergen Reactions  . Codeine Itching  . Daliresp [Roflumilast] Diarrhea  . Keflex [Cephalexin] Other (See Comments)    Gives pt yeast infection  . Penicillins Itching    Has patient had a PCN reaction causing immediate rash, facial/tongue/throat swelling, SOB or lightheadedness with hypotension: No Has patient had a PCN reaction causing severe rash involving mucus membranes or skin necrosis: Yes Has patient had a PCN reaction that required hospitalization No Has patient had a PCN reaction occurring within the last 10 years: No If all of the above answers are "NO", then may proceed with Cephalosporin use.     Medications: I have reviewed the patient's current medications  Results for orders placed or performed during the hospital encounter of 08/26/2016 (from the past 48 hour(s))  CBC with Differential     Status: Abnormal   Collection Time: 09/01/2016 12:16 PM  Result Value Ref Range   WBC 17.6 (H) 4.0 - 10.5 K/uL   RBC 2.74 (L) 3.87 - 5.11 MIL/uL   Hemoglobin 8.1 (L) 12.0 - 15.0 g/dL   HCT 25.0 (L) 36.0 - 46.0 %   MCV 91.2 78.0 - 100.0 fL   MCH 29.6 26.0 - 34.0 pg   MCHC 32.4 30.0 - 36.0 g/dL   RDW 19.2 (H) 11.5 - 15.5 %   Platelets 471 (H) 150 - 400 K/uL   Neutrophils Relative % 83 %   Neutro Abs 14.6 (H) 1.7 - 7.7 K/uL   Lymphocytes Relative 9 %  Lymphs Abs 1.5 0.7 - 4.0 K/uL   Monocytes Relative 7 %   Monocytes Absolute 1.3 (H) 0.1 - 1.0 K/uL   Eosinophils Relative 1 %   Eosinophils Absolute 0.2 0.0 - 0.7 K/uL   Basophils Relative 0 %   Basophils Absolute 0.0 0.0 - 0.1 K/uL  Comprehensive metabolic panel     Status: Abnormal   Collection Time: 09/05/2016 12:16 PM  Result Value Ref Range   Sodium 133 (L) 135 - 145 mmol/L   Potassium 5.2 (H) 3.5 - 5.1 mmol/L   Chloride 105 101 - 111 mmol/L   CO2 21 (L) 22 - 32 mmol/L   Glucose, Bld 110 (H) 65 - 99 mg/dL   BUN 61 (H) 6 - 20 mg/dL   Creatinine, Ser 1.42 (H) 0.44 - 1.00 mg/dL    Calcium 9.8 8.9 - 10.3 mg/dL   Total Protein 5.1 (L) 6.5 - 8.1 g/dL   Albumin 2.0 (L) 3.5 - 5.0 g/dL   AST 15 15 - 41 U/L   ALT 17 14 - 54 U/L   Alkaline Phosphatase 72 38 - 126 U/L   Total Bilirubin 0.4 0.3 - 1.2 mg/dL   GFR calc non Af Amer 36 (L) >60 mL/min   GFR calc Af Amer 42 (L) >60 mL/min    Comment: (NOTE) The eGFR has been calculated using the CKD EPI equation. This calculation has not been validated in all clinical situations. eGFR's persistently <60 mL/min signify possible Chronic Kidney Disease.    Anion gap 7 5 - 15  Type and screen [facility]     Status: None   Collection Time: 09/01/2016 12:16 PM  Result Value Ref Range   ABO/RH(D) A POS    Antibody Screen NEG    Sample Expiration 09/16/2016   Osmolality     Status: None   Collection Time: 08/30/2016 12:16 PM  Result Value Ref Range   Osmolality 295 275 - 295 mOsm/kg    Comment: Performed at [facility], [place] [address], [place], [place] [address]  Protime-INR     Status: None   Collection Time: 09/02/2016 12:51 PM  Result Value Ref Range   Prothrombin Time 13.6 11.4 - 15.2 seconds   INR 1.04   APTT     Status: None   Collection Time: 08/28/2016 12:51 PM  Result Value Ref Range   aPTT 24 24 - 36 seconds  MRSA PCR Screening     Status: None   Collection Time: 09/02/2016  9:00 PM  Result Value Ref Range   MRSA by PCR NEGATIVE NEGATIVE    Comment:        The GeneXpert MRSA Assay (FDA approved for NASAL specimens only), is one component of a comprehensive MRSA colonization surveillance program. It is not intended to diagnose MRSA infection nor to guide or monitor treatment for MRSA infections.   Glucose, capillary     Status: Abnormal   Collection Time: 08/25/2016  9:42 PM  Result Value Ref Range   Glucose-Capillary 146 (H) 65 - 99 mg/dL   Comment 1 Notify RN    Comment 2 Document in Chart   Basic metabolic panel     Status: Abnormal   Collection Time: 09/14/16  4:10 AM  Result Value Ref Range    Sodium 130 (L) 135 - 145 mmol/L   Potassium 5.0 3.5 - 5.1 mmol/L   Chloride 103 101 - 111 mmol/L   CO2 19 (L) 22 - 32 mmol/L  Glucose, Bld 127 (H) 65 - 99 mg/dL   BUN 71 (H) 6 - 20 mg/dL   Creatinine, Ser 1.76 (H) 0.44 - 1.00 mg/dL   Calcium 8.9 8.9 - 10.3 mg/dL   GFR calc non Af Amer 28 (L) >60 mL/min   GFR calc Af Amer 32 (L) >60 mL/min    Comment: (NOTE) The eGFR has been calculated using the CKD EPI equation. This calculation has not been validated in all clinical situations. eGFR's persistently <60 mL/min signify possible Chronic Kidney Disease.    Anion gap 8 5 - 15  CBC     Status: Abnormal   Collection Time: 09/14/16  4:10 AM  Result Value Ref Range   WBC 14.0 (H) 4.0 - 10.5 K/uL   RBC 2.39 (L) 3.87 - 5.11 MIL/uL   Hemoglobin 7.1 (L) 12.0 - 15.0 g/dL   HCT 22.0 (L) 36.0 - 46.0 %   MCV 92.1 78.0 - 100.0 fL   MCH 29.7 26.0 - 34.0 pg   MCHC 32.3 30.0 - 36.0 g/dL   RDW 19.2 (H) 11.5 - 15.5 %   Platelets 460 (H) 150 - 400 K/uL  Creatinine, urine, random     Status: None   Collection Time: 09/14/16  7:52 AM  Result Value Ref Range   Creatinine, Urine 36.63 mg/dL  Sodium, urine, random     Status: None   Collection Time: 09/14/16  7:52 AM  Result Value Ref Range   Sodium, Ur 18 mmol/L  Osmolality, urine     Status: None   Collection Time: 09/14/16  7:52 AM  Result Value Ref Range   Osmolality, Ur 315 300 - 900 mOsm/kg    Comment: Performed at [facility], [place] [address] [place], [place] [address]  Glucose, capillary     Status: Abnormal   Collection Time: 09/14/16  8:58 AM  Result Value Ref Range   Glucose-Capillary 107 (H) 65 - 99 mg/dL   Comment 1 Notify RN   Glucose, capillary     Status: Abnormal   Collection Time: 09/14/16 10:18 PM  Result Value Ref Range   Glucose-Capillary 113 (H) 65 - 99 mg/dL   Comment 1 Notify RN    Comment 2 Document in Chart   Basic metabolic panel     Status: Abnormal   Collection Time: 09/15/16  7:45 AM  Result Value  Ref Range   Sodium 128 (L) 135 - 145 mmol/L   Potassium 4.9 3.5 - 5.1 mmol/L   Chloride 103 101 - 111 mmol/L   CO2 17 (L) 22 - 32 mmol/L   Glucose, Bld 85 65 - 99 mg/dL   BUN 78 (H) 6 - 20 mg/dL   Creatinine, Ser 2.31 (H) 0.44 - 1.00 mg/dL   Calcium 8.2 (L) 8.9 - 10.3 mg/dL   GFR calc non Af Amer 20 (L) >60 mL/min   GFR calc Af Amer 23 (L) >60 mL/min    Comment: (NOTE) The eGFR has been calculated using the CKD EPI equation. This calculation has not been validated in all clinical situations. eGFR's persistently <60 mL/min signify possible Chronic Kidney Disease.    Anion gap 8 5 - 15  Glucose, capillary     Status: None   Collection Time: 09/15/16 10:26 AM  Result Value Ref Range   Glucose-Capillary 90 65 - 99 mg/dL   Comment 1 Notify RN    Comment 2 Document in Chart     US Renal  Result Date: 09/14/2016 CLINICAL  DATA:  Acute renal injury/insufficiency EXAM: RENAL ULTRASOUND COMPARISON:  None. FINDINGS: Right Kidney: Length: 11.0 cm. Echogenicity and renal cortical thickness are within normal limits. No mass, perinephric fluid, or hydronephrosis visualized. No sonographically demonstrable calculus or ureterectasis. Renal pyramids are rather prominent, a presumed anatomic variant in this age group. Left Kidney: Length: 12.4 cm. Echogenicity and renal cortical thickness are within normal limits. No mass, perinephric fluid, or hydronephrosis visualized. No sonographically demonstrable calculus or ureterectasis. Renal pyramids are rather prominent, a presumed anatomic variant in this age group. Bladder: Bladder completely empty and could not be interrogated. IMPRESSION: Prominence of renal pyramids bilaterally in a symmetric manner. Suspect anatomic variant. Study otherwise unremarkable. Note that the urinary bladder cannot be interrogated he to completely emptied state. Electronically Signed   By: Lowella Grip III M.D.   On: 09/14/2016 12:39   Dg Chest Portable 1 View  Result Date:  09/20/2016 CLINICAL DATA:  Weakness and shortness of breath. EXAM: PORTABLE CHEST 1 VIEW COMPARISON:  Chest CT dated 08/22/2016 and chest x-rays dated 08/21/2016 and 04/19/2016 FINDINGS: There has been almost complete resolution of the cavitary infiltrate at the right lung base as well as the infiltrate medially in the right upper lobe. Irregular nodular density in the right upper lobe persists as demonstrated on the prior CT scan. No acute abnormalities. Heart size and vascularity are normal. PICC appears in good position. IMPRESSION: Marked improvement in the infiltrates in the right upper lobe medially and in the right lung base. Irregular nodular density in the right upper lobe persists. I recommend a CT scan of the chest without contrast in 2 months for further evaluation of this area. Electronically Signed   By: Lorriane Shire M.D.   On: 09/05/2016 12:32    Review of Systems  Constitutional: Positive for malaise/fatigue. Negative for chills and fever.  Respiratory: Positive for shortness of breath.   Cardiovascular: Positive for leg swelling. Negative for chest pain and orthopnea.  Gastrointestinal: Negative for diarrhea, nausea and vomiting.   Blood pressure (!) 114/56, pulse 99, temperature 97.7 F (36.5 C), temperature source Oral, resp. rate 18, height 5' 4"  (1.626 m), weight 60.8 kg (134 lb), SpO2 98 %. Physical Exam  Constitutional: She is oriented to person, place, and time. No distress.  Eyes: No scleral icterus.  Neck: JVD present.  Cardiovascular: Normal rate and regular rhythm.   Respiratory: No respiratory distress. She has no wheezes. She has rales.  GI: She exhibits no distension. There is no tenderness. There is no rebound.  Musculoskeletal: She exhibits edema and tenderness.  Neurological: She is alert and oriented to person, place, and time.    Assessment/Plan: Problem #1 Acute kidney injury: Possibly prerenal/ATN/AIN/Artheroebolic disease. Presently her BUN and  creatinine is progressively increasing from baseline of 0.30 on 08/28/2016 to present level. Since urine output is not documented at this moment no sure how much urine she next. Patient usually refuses to take her diuretics because she does want to wait her bed. Problem #2 history of Hereditary hemachromatosis Problem #3 history of polycythemia: Patient goes through phlebotomy as an outpatient. Problem #4 history of COPD Problem #5 history of hyponatremia: Etiology not clear. On her medication list patient has been on demeclocycline possibly for SIADH. Problem #6 history of leg edema: Chronic long-standing. Presently patient is sign of stasis and multiple skin lesions. Problem #7 history of possible endocarditis Plan: We'll start patient on Lasix 40 mg IV twice a day 2] we will use folic catheter to document  input and output and also patient complains of urinary incontinence. That's the reason for her to refuse diuretics. 3] we'll check her ANA, complement, 4] we'll check renal panel in the morning.  Walker Sitar S 09/15/2016, 10:58 AM

## 2016-09-15 NOTE — NC FL2 (Signed)
[place] LEVEL OF CARE SCREENING TOOL     IDENTIFICATION  Patient Name: Leah Dalton Birthdate: Dec 26, 1945 Sex: female Admission Date (Current Location): 09/20/2016  [facility] and [place] Number:  [employer] and Address:  [place] [address] [place]      Provider Number: [id number]  Attending Physician Name and Address:  Damita Lack, MD  Relative Name and Phone Number:       Current Level of Care: Hospital Recommended Level of Care: [place] Prior Approval Number:    Date Approved/Denied:   PASRR Number:    Discharge Plan: SNF    Current Diagnoses: Patient Active Problem List   Diagnosis Date Noted  . Protein-calorie malnutrition, severe 09/15/2016  . AKI (acute kidney injury) ([place]) 09/14/2016  . Acute renal failure (ARF) ([place]) 09/20/2016  . Hyperglycemia 08/29/2016  . Encounter for hospice care discussion   . Palliative care encounter   . Goals of care, counseling/discussion   . DNR (do not resuscitate) discussion   . Hypoalbuminemia   . Thrombocytosis ([place]) 08/21/2016  . Leukocytosis 08/21/2016  . Positive D dimer 08/21/2016  . HCAP (healthcare-associated pneumonia) 08/21/2016  . Pneumonia of right middle lobe due to infectious organism ([place])   . Acute hypokalemia 02/20/2016  . 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 AVM (arteriovenous malformation) of colon 04/10/2014  . IDA (iron deficiency anemia) 04/10/2014  . External hemorrhoids 02/24/2014  . Reflux esophagitis   . Anemia 02/22/2014  . COPD (chronic obstructive pulmonary disease) ([place]) 06/07/2012  . Acute-on-chronic respiratory failure ([place]) 06/07/2012  . Polycythemia 06/07/2012  . Hyponatremia 06/07/2012    Orientation RESPIRATION BLADDER Height & Weight     Self, Place  O2 (4L) Incontinent Weight: 134 lb (60.8 kg) Height:  5\' 4"  (162.6 cm)  BEHAVIORAL SYMPTOMS/MOOD NEUROLOGICAL BOWEL NUTRITION STATUS      Incontinent  Diet (See DC summary)  Boost Breeze po TID, each supplement provides 250 kcal and 9 grams of protein  Food requests passed to dietary, including Prune juice daily   AMBULATORY STATUS COMMUNICATION OF NEEDS Skin   Extensive Assist Verbally Normal                       Personal Care Assistance Level of Assistance  Bathing, Feeding, Dressing Bathing Assistance: Limited assistance Feeding assistance: Limited assistance Dressing Assistance: Limited assistance     Functional Limitations Info  Sight, Hearing, Speech Sight Info: Adequate Hearing Info: Adequate Speech Info: Adequate    SPECIAL CARE FACTORS FREQUENCY   Will need 4 weeks of IV antibiotics. PICC Line placed      Next 4 weeks              Contractures Contractures Info: Not present    Additional Factors Info  Code Status, Allergies Code Status Info: Full code Allergies Info: Codeine, Daliresp Roflumilast, Keflex Cephalexin, Penicillins           Current Medications (09/15/2016):  This is the current hospital active medication list Current Facility-Administered Medications  Medication Dose Route Frequency Provider Last Rate Last Dose  . 0.9 %  sodium chloride infusion   Intravenous Continuous Karmen Bongo, MD 75 mL/hr at 09/15/16 0610    . 0.9 %  sodium chloride infusion   Intravenous Continuous Amin, Ankit Chirag, MD      . acetaminophen (TYLENOL) tablet 650 mg  650 mg Oral Q6H PRN Karmen Bongo, MD  Or  . acetaminophen (TYLENOL) suppository 650 mg  650 mg Rectal Q6H PRN Karmen Bongo, MD      . acidophilus (RISAQUAD) capsule 1 capsule  1 capsule Oral Daily Karmen Bongo, MD   1 capsule at 09/14/16 1022  . albuterol (PROVENTIL) (2.5 MG/3ML) 0.083% nebulizer solution 2.5 mg  2.5 mg Nebulization Q6H PRN Amin, Ankit Chirag, MD      . ALPRAZolam Duanne Moron) tablet 0.5 mg  0.5 mg Oral BID PRN Damita Lack, MD   0.5 mg at 09/15/16 0843  . amLODipine (NORVASC) tablet 5 mg  5 mg Oral Daily  Karmen Bongo, MD   5 mg at 09/14/16 1022  . aspirin EC tablet 325 mg  325 mg Oral Daily Karmen Bongo, MD   325 mg at 09/14/16 1025  . bisacodyl (DULCOLAX) EC tablet 5 mg  5 mg Oral Daily PRN Karmen Bongo, MD      . ceFEPIme (MAXIPIME) 1 g in dextrose 5 % 50 mL IVPB  1 g Intravenous Q24H Amin, Ankit Chirag, MD 100 mL/hr at 09/15/16 0829 1 g at 09/15/16 0829  . cholecalciferol (VITAMIN D) tablet 1,000 Units  1,000 Units Oral Daily Karmen Bongo, MD   1,000 Units at 09/14/16 1023  . docusate sodium (COLACE) capsule 100 mg  100 mg Oral BID Karmen Bongo, MD   100 mg at 09/14/16 2135  . enoxaparin (LOVENOX) injection 30 mg  30 mg Subcutaneous Q24H Karmen Bongo, MD   30 mg at 09/14/16 2134  . feeding supplement (BOOST / RESOURCE BREEZE) liquid 1 Container  1 Container Oral TID BM Amin, Ankit Chirag, MD      . feeding supplement (PRO-STAT 64) liquid 30 mL  30 mL Oral BID Karmen Bongo, MD   30 mL at 09/14/16 2134  . ferrous sulfate 300 (60 Fe) MG/5ML syrup 300 mg  300 mg Oral Daily Karmen Bongo, MD   300 mg at 09/14/16 1025  . fluticasone furoate-vilanterol (BREO ELLIPTA) 200-25 MCG/INH 1 puff  1 puff Inhalation Daily Karmen Bongo, MD   1 puff at 09/15/16 0800  . guaiFENesin-dextromethorphan (ROBITUSSIN DM) 100-10 MG/5ML syrup 10 mL  10 mL Oral Q4H PRN Karmen Bongo, MD   10 mL at 09/14/16 1043  . insulin aspart (novoLOG) injection 0-10 Units  0-10 Units Subcutaneous BID Karmen Bongo, MD      . ipratropium-albuterol (DUONEB) 0.5-2.5 (3) MG/3ML nebulizer solution 3 mL  3 mL Nebulization TID Amin, Ankit Chirag, MD   3 mL at 09/15/16 0740  . mirtazapine (REMERON) tablet 15 mg  15 mg Oral Ivery Quale, MD   15 mg at 09/14/16 2135  . ondansetron (ZOFRAN) tablet 4 mg  4 mg Oral Q6H PRN Karmen Bongo, MD       Or  . ondansetron [facility]) injection 4 mg  4 mg Intravenous Q6H PRN Karmen Bongo, MD      . pantoprazole (PROTONIX) EC tablet 40 mg  40 mg Oral Daily Karmen Bongo, MD   40 mg at 09/14/16 1023  . polyethylene glycol (MIRALAX / GLYCOLAX) packet 17 g  17 g Oral Daily Karmen Bongo, MD   17 g at 09/14/16 1025  . predniSONE (DELTASONE) tablet 40 mg  40 mg Oral Q breakfast Amin, Ankit Chirag, MD   40 mg at 09/15/16 0829  . sodium chloride (OCEAN) 0.65 % nasal spray 1 spray  1 spray Each Nare TID Karmen Bongo, MD      . sodium phosphate (FLEET) 7-19 GM/118ML  enema 1 enema  1 enema Rectal Once PRN Karmen Bongo, MD      . vitamin C (ASCORBIC ACID) tablet 500 mg  500 mg Oral Daily Karmen Bongo, MD   500 mg at 09/14/16 1023     Discharge Medications: Please see discharge summary for a list of discharge medications.  Relevant Imaging Results:  Relevant Lab Results:   Additional Information SSN: [id number]      Lilly Cove, LCSW

## 2016-09-15 NOTE — Progress Notes (Signed)
PT Cancellation Note  Patient Details Name: Leah Dalton MRN: [id number] DOB: [birth year]   Cancelled Treatment:    Reason Eval/Treat Not Completed: Medical issues which prohibited therapy. Chart reviewed. Pt hyponatremic with Na+ decreased since 1DA, now at 128, below the hospital policy cutoff for OOB assessment with PT. Pt will better demonstrate potential for mobility when levels return to 130 or higher. Will continue to monitor remotely and attempt PT evaluation again at later date/time as appropriate.  1:24 PM, 09/15/16 Etta Grandchild, PT, DPT Physical Therapist - [place] [phone] (Office)    Buccola,Allan C 09/15/2016, 1:23 PM

## 2016-09-15 NOTE — Plan of Care (Signed)
Problem: Pain Managment: Goal: General experience of comfort will improve Outcome: Progressing Pt resting quietly. Pt has no c/o this shift. Turned q2h (pt states she can turn herself). Pt very drowsy this shift, but awakens to voice and follows commands. Will continue to monitor pt

## 2016-09-16 LAB — GLUCOSE, CAPILLARY
GLUCOSE-CAPILLARY: 101 mg/dL — AB (ref 65–99)
GLUCOSE-CAPILLARY: 93 mg/dL (ref 65–99)
GLUCOSE-CAPILLARY: 100 mg/dL — AB (ref 65–99)
Glucose-Capillary: 96 mg/dL (ref 65–99)

## 2016-09-16 LAB — CBC
HEMATOCRIT: 19.3 % — AB (ref 36.0–46.0)
HEMOGLOBIN: 6.3 g/dL — AB (ref 12.0–15.0)
MCH: 29.7 pg (ref 26.0–34.0)
MCHC: 32.6 g/dL (ref 30.0–36.0)
MCV: 91 fL (ref 78.0–100.0)
Platelets: 455 10*3/uL — ABNORMAL HIGH (ref 150–400)
RBC: 2.12 MIL/uL — AB (ref 3.87–5.11)
RDW: 18.4 % — ABNORMAL HIGH (ref 11.5–15.5)
WBC: 11 10*3/uL — ABNORMAL HIGH (ref 4.0–10.5)

## 2016-09-16 LAB — RENAL FUNCTION PANEL
ANION GAP: 9 (ref 5–15)
Albumin: 1.9 g/dL — ABNORMAL LOW (ref 3.5–5.0)
BUN: 83 mg/dL — AB (ref 6–20)
CALCIUM: 8.1 mg/dL — AB (ref 8.9–10.3)
CO2: 15 mmol/L — ABNORMAL LOW (ref 22–32)
Chloride: 101 mmol/L (ref 101–111)
Creatinine, Ser: 2.63 mg/dL — ABNORMAL HIGH (ref 0.44–1.00)
GFR calc Af Amer: 20 mL/min — ABNORMAL LOW (ref >60)
GFR calc non Af Amer: 17 mL/min — ABNORMAL LOW (ref >60)
GLUCOSE: 92 mg/dL (ref 65–99)
Phosphorus: 5.7 mg/dL — ABNORMAL HIGH (ref 2.5–4.6)
Potassium: 5.1 mmol/L (ref 3.5–5.1)
SODIUM: 125 mmol/L — AB (ref 135–145)

## 2016-09-16 LAB — PREPARE RBC (CROSSMATCH)

## 2016-09-16 LAB — URINALYSIS, ROUTINE W REFLEX MICROSCOPIC
Bilirubin Urine: NEGATIVE
GLUCOSE, UA: NEGATIVE mg/dL
KETONES UR: NEGATIVE mg/dL
Nitrite: NEGATIVE
PROTEIN: 30 mg/dL — AB
Specific Gravity, Urine: 1.008 (ref 1.005–1.030)
pH: 5 (ref 5.0–8.0)

## 2016-09-16 LAB — COMPLEMENT, TOTAL

## 2016-09-16 MED ORDER — ALBUMIN HUMAN 25 % IV SOLN
25.0000 g | Freq: Two times a day (BID) | INTRAVENOUS | Status: DC
  Administered 2016-09-16 – 2016-09-17 (×4): 25 g via INTRAVENOUS
  Filled 2016-09-16 (×5): qty 100
  Filled 2016-09-16: qty 50
  Filled 2016-09-16: qty 100

## 2016-09-16 MED ORDER — SODIUM CHLORIDE 0.9 % IV SOLN
INTRAVENOUS | Status: DC
  Administered 2016-09-16: 21:00:00 via INTRAVENOUS

## 2016-09-16 MED ORDER — SODIUM CHLORIDE 0.9 % IV SOLN
INTRAVENOUS | Status: DC
  Administered 2016-09-16: 18:00:00 via INTRAVENOUS
  Filled 2016-09-16 (×5): qty 100

## 2016-09-16 MED ORDER — SODIUM CHLORIDE 0.9 % IV SOLN
Freq: Once | INTRAVENOUS | Status: AC
Start: 2016-09-16 — End: 2016-09-16
  Administered 2016-09-16: 15:00:00 via INTRAVENOUS

## 2016-09-16 NOTE — Progress Notes (Signed)
CRITICAL VALUE ALERT  Critical Value: Hemoglobin 6.3  Date & Time Notied:09-16-16 0640  Provider Notified: Dr. Marin Comment  Orders Received/Actions taken: No new orders received at this time.  Both nurse and MD will notify daytime shift of critical lab.

## 2016-09-16 NOTE — Progress Notes (Signed)
Patient ID: Leah Dalton, female   DOB: 09-14-1945, 71 y.o.   MRN: [id number]  PROGRESS NOTE    KAELEN BRENNAN  LFY:[id number] DOB: 11/12/1945 DOA: 09/11/2016  PCP: Crisoforo Oxford, MD   Brief Narrative:  71 year old female with GERD, hereditary hemochromatosis, polycythemia with history of phlebotomy, COPD> Pt presented to ED from nursing home with abnormal labs. Her hgb was less than 7 and creatinine was 1.4 (baseline 0.3).   Assessment & Plan:   Principal Problem:   Acute renal failure (ARF) (HCC) - Likely ATN / AIN / atheroembolic disease - Creatinine continue to rise from her baseline - Seen by renal  - Started lasix 40 mg IV BID  Active Problems:   Hyponatremia - Due to ARF and lasix  - Renal is following - Monitor BMP daily     COPD (chronic obstructive pulmonary disease) (HCC) - Continue current bronchodilator treatment and prednisone     HCAP / Leukocytosis - Continue cefepime - WBC count likely still up from prednisone  - Uses supplemental oxygen at home 3 L via [place]    Iron deficiency anemia - Continue ferrous sulfate - Hemoglobin today 6.3 - Will give 1 U PRBC transfusion today     Protein-calorie malnutrition, severe - In the context of chronic illness - Diet as tolerated     Essential hypertension - Continue Norvasc    DVT prophylaxis: Lovenox subQ Code Status: DNR/DNI Family Communication: no family at the bedside this am Disposition Plan: remains in SDU for next 24 hours    Consultants:   Renal   Pulmonary   Procedures:   None   Antimicrobials:   Cefepime    Subjective: No overnight events.  Objective: Vitals:   09/15/16 2109 09/16/16 0639 09/16/16 0755 09/16/16 0801  BP:  (!) 112/102    Pulse:  (!) 101    Resp:  20    Temp:  98 F (36.7 C)    TempSrc:  Oral    SpO2: 97% 100% 99% 99%  Weight:      Height:        Intake/Output Summary (Last 24 hours) at 09/16/16 1015 Last data filed at 09/16/16 0300  Gross per 24 hour   Intake           2747.5 ml  Output                0 ml  Net           2747.5 ml   Filed Weights   09/21/2016 1153 08/25/2016 1700  Weight: 60.8 kg (134 lb) 60.8 kg (134 lb)    Examination:  General exam: Appears calm and comfortable  Respiratory system: Clear to auscultation. Respiratory effort normal. Cardiovascular system: S1 & S2 heard, Rate controlled  Gastrointestinal system: Abdomen is nondistended, soft and nontender. No organomegaly or masses felt. Normal bowel sounds heard. Central nervous system: Alert and oriented. No focal neurological deficits. Extremities: +2-3 LE edema pitting with extensive ecchymoses  Skin: No rashes, lesions or ulcers Psychiatry: Judgement and insight appear normal. Mood & affect appropriate.   Data Reviewed: I have personally reviewed following labs and imaging studies  CBC:  Recent Labs Lab 09/12/2016 1216 09/14/16 0410 09/16/16 0506  WBC 17.6* 14.0* 11.0*  NEUTROABS 14.6*  --   --   HGB 8.1* 7.1* 6.3*  HCT 25.0* 22.0* 19.3*  MCV 91.2 92.1 91.0  PLT 471* 460* 852*   Basic Metabolic Panel:  Recent Labs Lab 09/07/2016  1216 09/14/16 0410 09/15/16 0745 09/16/16 0506  NA 133* 130* 128* 125*  K 5.2* 5.0 4.9 5.1  CL 105 103 103 101  CO2 21* 19* 17* 15*  GLUCOSE 110* 127* 85 92  BUN 61* 71* 78* 83*  CREATININE 1.42* 1.76* 2.31* 2.63*  CALCIUM 9.8 8.9 8.2* 8.1*  PHOS  --   --   --  5.7*   GFR: Estimated Creatinine Clearance: 16.9 mL/min (A) (by C-G formula based on SCr of 2.63 mg/dL (H)). Liver Function Tests:  Recent Labs Lab 08/30/2016 1216 09/16/16 0506  AST 15  --   ALT 17  --   ALKPHOS 72  --   BILITOT 0.4  --   PROT 5.1*  --   ALBUMIN 2.0* 1.9*   No results for input(s): LIPASE, AMYLASE in the last 168 hours. No results for input(s): AMMONIA in the last 168 hours. Coagulation Profile:  Recent Labs Lab 09/05/2016 1251  INR 1.04   Cardiac Enzymes: No results for input(s): CKTOTAL, CKMB, CKMBINDEX, TROPONINI in the  last 168 hours. BNP (last 3 results) No results for input(s): PROBNP in the last 8760 hours. HbA1C: No results for input(s): HGBA1C in the last 72 hours. CBG:  Recent Labs Lab 09/14/16 0858 09/14/16 2218 09/15/16 1026 09/15/16 2149 09/16/16 0732  GLUCAP 107* 113* 90 149* 96   Lipid Profile: No results for input(s): CHOL, HDL, LDLCALC, TRIG, CHOLHDL, LDLDIRECT in the last 72 hours. Thyroid Function Tests: No results for input(s): TSH, T4TOTAL, FREET4, T3FREE, THYROIDAB in the last 72 hours. Anemia Panel: No results for input(s): VITAMINB12, FOLATE, FERRITIN, TIBC, IRON, RETICCTPCT in the last 72 hours. Urine analysis: No results found for: COLORURINE, APPEARANCEUR, LABSPEC, PHURINE, GLUCOSEU, HGBUR, BILIRUBINUR, KETONESUR, PROTEINUR, UROBILINOGEN, NITRITE, LEUKOCYTESUR Sepsis Labs: @LABRCNTIP (procalcitonin:4,lacticidven:4)    MRSA PCR Screening     Status: None   Collection Time: 08/26/2016  9:00 PM  Result Value Ref Range Status   MRSA by PCR NEGATIVE NEGATIVE Final      Radiology Studies: US Renal Result Date: 09/14/2016 Prominence of renal pyramids bilaterally in a symmetric manner. Suspect anatomic variant. Study otherwise unremarkable.   Dg Chest Portable 1 View Result Date: 09/01/2016  Marked improvement in the infiltrates in the right upper lobe medially and in the right lung base. Irregular nodular density in the right upper lobe persists. I recommend a CT scan of the chest without contrast in 2 months for further evaluation of this area.     Scheduled Meds: . acidophilus  1 capsule Oral Daily  . amLODipine  5 mg Oral Daily  . aspirin EC  325 mg Oral Daily  . cholecalciferol  1,000 Units Oral Daily  . docusate sodium  100 mg Oral BID  . enoxaparin (LOVENOX) injection  30 mg Subcutaneous Q24H  . feeding supplement  1 Container Oral TID BM  . feeding supplement (PRO-STAT 64)  30 mL Oral BID  . ferrous sulfate  300 mg Oral Daily  . fluticasone  furoate-vilanterol  1 puff Inhalation Daily  . furosemide  40 mg Intravenous BID  . insulin aspart  0-10 Units Subcutaneous BID  . ipratropium-albuterol  3 mL Nebulization TID  . mirtazapine  15 mg Oral QHS  . pantoprazole  40 mg Oral Daily  . polyethylene glycol  17 g Oral Daily  . predniSONE  40 mg Oral Q breakfast  . sodium chloride  1 spray Each Nare TID  . vitamin C  500 mg Oral Daily   Continuous Infusions: .  albumin human    . ceFEPime (MAXIPIME) IV 1 g (09/16/16 0945)  .  sodium bicarbonate  infusion 1000 mL       LOS: 2 days    Time spent: 25 minutes  Greater than 50% of the time spent on counseling and coordinating the care.   Leisa Lenz, MD [facility] Pager [phone]  If 7PM-7AM, please contact night-coverage [email or web address] Password [email or web address] 09/16/2016, 10:15 AM

## 2016-09-16 NOTE — Progress Notes (Signed)
Subjective: Interval History: has no complaint of nausea or vomiting. Her breathing is much better. Presently she offers no complaints..  Objective: Vital signs in last 24 hours: Temp:  [97.5 F (36.4 C)-98 F (36.7 C)] 98 F (36.7 C) (06/23 0639) Pulse Rate:  [101-104] 101 (06/23 0639) Resp:  [18-20] 20 (06/23 0639) BP: (112-124)/(68-102) 112/102 (06/23 0639) SpO2:  [97 %-100 %] 99 % (06/23 0801) Weight change:   Intake/Output from previous day: 06/22 0701 - 06/23 0700 In: 2747.5 [I.V.:2697.5; IV Piggyback:50] Out: -  Intake/Output this shift: No intake/output data recorded.  General appearance: alert, cooperative and no distress Resp: diminished breath sounds bilaterally Cardio: irregularly irregular rhythm Extremities: edema 1-2+ edema  Lab Results:  Recent Labs  09/14/16 0410 09/16/16 0506  WBC 14.0* 11.0*  HGB 7.1* 6.3*  HCT 22.0* 19.3*  PLT 460* 455*   BMET:  Recent Labs  09/15/16 0745 09/16/16 0506  NA 128* 125*  K 4.9 5.1  CL 103 101  CO2 17* 15*  GLUCOSE 85 92  BUN 78* 83*  CREATININE 2.31* 2.63*  CALCIUM 8.2* 8.1*   No results for input(s): PTH in the last 72 hours. Iron Studies: No results for input(s): IRON, TIBC, TRANSFERRIN, FERRITIN in the last 72 hours.  Studies/Results: US Renal  Result Date: 09/14/2016 CLINICAL DATA:  Acute renal injury/insufficiency EXAM: RENAL ULTRASOUND COMPARISON:  None. FINDINGS: Right Kidney: Length: 11.0 cm. Echogenicity and renal cortical thickness are within normal limits. No mass, perinephric fluid, or hydronephrosis visualized. No sonographically demonstrable calculus or ureterectasis. Renal pyramids are rather prominent, a presumed anatomic variant in this age group. Left Kidney: Length: 12.4 cm. Echogenicity and renal cortical thickness are within normal limits. No mass, perinephric fluid, or hydronephrosis visualized. No sonographically demonstrable calculus or ureterectasis. Renal pyramids are rather  prominent, a presumed anatomic variant in this age group. Bladder: Bladder completely empty and could not be interrogated. IMPRESSION: Prominence of renal pyramids bilaterally in a symmetric manner. Suspect anatomic variant. Study otherwise unremarkable. Note that the urinary bladder cannot be interrogated he to completely emptied state. Electronically Signed   By: Lowella Grip III M.D.   On: 09/14/2016 12:39    I have reviewed the patient's current medications.  Assessment/Plan: Problem #1 renal failure: Possibly acute on chronic. Presently her BUN and creatinine is increasing. Most likely from diuresis. Patient at this moment however denies any nausea or vomiting. Problem #2 anasarca: At this moment etiology not clear. Patient with severe hypoalbuminemia of 1.9. She is on Lasix and she has 2700 mL of urine output and improving. Problem #3 anemia: Her hemoglobin and hematocrit is declining. Possibly iron deficiency anemia. Patient with history of polycythemia Problem #4 hypertension: Her blood pressure is reasonably controlled Problem #5 history of COPD : She is on oxygen and is feeling better Problem #6 history of hemachromatosis Problem #7 bone and metabolic disorder: Her calcium and phosphorous is a range Problem #8 hypervolemic hyponatremia: Possibly from intravascular depletion with increased free water intake Problem #9 low CO2 possibly metabolic Plan: 1] We'll do 24-hour urine for protein and immunoelectrophoresis 2] will add hepatitis B surface antigen, hepatitis C antibody. 3] will use albumin 25 g IV twice a day for 2 days with the Lasix 4] will advise patient to decrease her free water intake. 5.] Will check her CBC, iron studies renal panel in the morning 6.] We'll start her on normal saline with 100 mEq of sodium bicarbonate at 100 mL per hour. 7] will DC normal saline.  LOS: 2 days   Kidus Delman S 09/16/2016,8:22 AM

## 2016-09-16 NOTE — Progress Notes (Signed)
Subjective: She says she feels weak. She has no new complaints. Her breathing is okay. Hemoglobin level this morning is 6.3.  Objective: Vital signs in last 24 hours: Temp:  [97.5 F (36.4 C)-98 F (36.7 C)] 98 F (36.7 C) (06/23 0639) Pulse Rate:  [101-104] 101 (06/23 0639) Resp:  [18-20] 20 (06/23 0639) BP: (112-124)/(68-102) 112/102 (06/23 0639) SpO2:  [97 %-100 %] 99 % (06/23 0801) Weight change:  Last BM Date: 09/09/2016  Intake/Output from previous day: 06/22 0701 - 06/23 0700 In: 2747.5 [I.V.:2697.5; IV Piggyback:50] Out: -   PHYSICAL EXAM General appearance: alert, cooperative, no distress and Looks chronically sick Resp: rhonchi bilaterally Cardio: irregularly irregular rhythm GI: soft, non-tender; bowel sounds normal; no masses,  no organomegaly Extremities: She still has significant swelling and has some periorbital edema now Bruises on her legs left more than right  Lab Results:  Results for orders placed or performed during the hospital encounter of 09/14/2016 (from the past 48 hour(s))  Glucose, capillary     Status: Abnormal   Collection Time: 09/14/16 10:18 PM  Result Value Ref Range   Glucose-Capillary 113 (H) 65 - 99 mg/dL   Comment 1 Notify RN    Comment 2 Document in Chart   Basic metabolic panel     Status: Abnormal   Collection Time: 09/15/16  7:45 AM  Result Value Ref Range   Sodium 128 (L) 135 - 145 mmol/L   Potassium 4.9 3.5 - 5.1 mmol/L   Chloride 103 101 - 111 mmol/L   CO2 17 (L) 22 - 32 mmol/L   Glucose, Bld 85 65 - 99 mg/dL   BUN 78 (H) 6 - 20 mg/dL   Creatinine, Ser 2.31 (H) 0.44 - 1.00 mg/dL   Calcium 8.2 (L) 8.9 - 10.3 mg/dL   GFR calc non Af Amer 20 (L) >60 mL/min   GFR calc Af Amer 23 (L) >60 mL/min    Comment: (NOTE) The eGFR has been calculated using the CKD EPI equation. This calculation has not been validated in all clinical situations. eGFR's persistently <60 mL/min signify possible Chronic Kidney Disease.    Anion gap 8 5 -  15  Glucose, capillary     Status: None   Collection Time: 09/15/16 10:26 AM  Result Value Ref Range   Glucose-Capillary 90 65 - 99 mg/dL   Comment 1 Notify RN    Comment 2 Document in Chart   Complement, total     Status: None   Collection Time: 09/15/16 11:18 AM  Result Value Ref Range   Compl, Total (CH50) >60 >41 U/mL    Comment: (NOTE) Performed At: [facility] [place], [place] [id number] Lindon Romp MD KZ:[id number]   Glucose, capillary     Status: Abnormal   Collection Time: 09/15/16  9:49 PM  Result Value Ref Range   Glucose-Capillary 149 (H) 65 - 99 mg/dL   Comment 1 Notify RN    Comment 2 Document in Chart   Renal function panel     Status: Abnormal   Collection Time: 09/16/16  5:06 AM  Result Value Ref Range   Sodium 125 (L) 135 - 145 mmol/L   Potassium 5.1 3.5 - 5.1 mmol/L   Chloride 101 101 - 111 mmol/L   CO2 15 (L) 22 - 32 mmol/L   Glucose, Bld 92 65 - 99 mg/dL   BUN 83 (H) 6 - 20 mg/dL   Creatinine, Ser 2.63 (H) 0.44 - 1.00 mg/dL  Calcium 8.1 (L) 8.9 - 10.3 mg/dL   Phosphorus 5.7 (H) 2.5 - 4.6 mg/dL   Albumin 1.9 (L) 3.5 - 5.0 g/dL   GFR calc non Af Amer 17 (L) >60 mL/min   GFR calc Af Amer 20 (L) >60 mL/min    Comment: (NOTE) The eGFR has been calculated using the CKD EPI equation. This calculation has not been validated in all clinical situations. eGFR's persistently <60 mL/min signify possible Chronic Kidney Disease.    Anion gap 9 5 - 15  CBC     Status: Abnormal   Collection Time: 09/16/16  5:06 AM  Result Value Ref Range   WBC 11.0 (H) 4.0 - 10.5 K/uL   RBC 2.12 (L) 3.87 - 5.11 MIL/uL   Hemoglobin 6.3 (LL) 12.0 - 15.0 g/dL    Comment: REPEATED TO VERIFY CRITICAL RESULT CALLED TO, READ BACK BY AND VERIFIED WITH: JOHNSON,B @ 0640 ON 6.23.18 BY BAYSE,L    HCT 19.3 (L) 36.0 - 46.0 %   MCV 91.0 78.0 - 100.0 fL   MCH 29.7 26.0 - 34.0 pg   MCHC 32.6 30.0 - 36.0 g/dL   RDW 18.4 (H) 11.5 - 15.5 %   Platelets 455 (H)  150 - 400 K/uL  Glucose, capillary     Status: None   Collection Time: 09/16/16  7:32 AM  Result Value Ref Range   Glucose-Capillary 96 65 - 99 mg/dL   Comment 1 Notify RN    Comment 2 Document in Chart     ABGS No results for input(s): PHART, PO2ART, TCO2, HCO3 in the last 72 hours.  Invalid input(s): PCO2 CULTURES Recent Results (from the past 240 hour(s))  MRSA PCR Screening     Status: None   Collection Time: 08/28/2016  9:00 PM  Result Value Ref Range Status   MRSA by PCR NEGATIVE NEGATIVE Final    Comment:        The GeneXpert MRSA Assay (FDA approved for NASAL specimens only), is one component of a comprehensive MRSA colonization surveillance program. It is not intended to diagnose MRSA infection nor to guide or monitor treatment for MRSA infections.    Studies/Results: US Renal  Result Date: 09/14/2016 CLINICAL DATA:  Acute renal injury/insufficiency EXAM: RENAL ULTRASOUND COMPARISON:  None. FINDINGS: Right Kidney: Length: 11.0 cm. Echogenicity and renal cortical thickness are within normal limits. No mass, perinephric fluid, or hydronephrosis visualized. No sonographically demonstrable calculus or ureterectasis. Renal pyramids are rather prominent, a presumed anatomic variant in this age group. Left Kidney: Length: 12.4 cm. Echogenicity and renal cortical thickness are within normal limits. No mass, perinephric fluid, or hydronephrosis visualized. No sonographically demonstrable calculus or ureterectasis. Renal pyramids are rather prominent, a presumed anatomic variant in this age group. Bladder: Bladder completely empty and could not be interrogated. IMPRESSION: Prominence of renal pyramids bilaterally in a symmetric manner. Suspect anatomic variant. Study otherwise unremarkable. Note that the urinary bladder cannot be interrogated he to completely emptied state. Electronically Signed   By: Lowella Grip III M.D.   On: 09/14/2016 12:39    Medications:  Prior to  Admission:  Prescriptions Prior to Admission  Medication Sig Dispense Refill Last Dose  . acetaminophen (TYLENOL) 325 MG tablet Take 650 mg by mouth every 6 (six) hours as needed for moderate pain.   09/12/2016 at Unknown time  . Amino Acids-Protein Hydrolys (FEEDING SUPPLEMENT, PRO-STAT 64,) LIQD Take 30 mLs by mouth 2 (two) times daily.   09/12/2016 at Unknown time  . amLODipine (  NORVASC) 5 MG tablet Take 5 mg by mouth daily.   09/12/2016 at Unknown time  . aspirin EC 325 MG tablet Take 325 mg by mouth daily.   08/21/2016 at Unknown time  . ceFEPIme 2 g in dextrose 5 % 50 mL Inject 2 g into the vein every 8 (eight) hours.   08/27/2016 at Unknown time  . Cholecalciferol (VITAMIN D-3) 1000 units CAPS Take 1 tablet by mouth daily.   09/12/2016 at Unknown time  . demeclocycline (DECLOMYCIN) 150 MG tablet Take 150 mg by mouth 2 (two) times daily.   09/12/2016 at Unknown time  . docusate sodium (COLACE) 100 MG capsule Take 100 mg by mouth 2 (two) times daily.   09/12/2016 at Unknown time  . ferrous sulfate 300 (60 Fe) MG/5ML syrup Take 300 mg by mouth daily.   09/12/2016 at Unknown time  . fluticasone furoate-vilanterol (BREO ELLIPTA) 200-25 MCG/INH AEPB Inhale 1 puff into the lungs daily.   09/12/2016 at Unknown time  . furosemide (LASIX) 40 MG tablet Take 40 mg by mouth daily.    09/12/2016 at Unknown time  . guaiFENesin-dextromethorphan (ROBITUSSIN DM) 100-10 MG/5ML syrup Take 10 mLs by mouth every 4 (four) hours as needed for cough.   Past Week at Unknown time  . insulin aspart (NOVOLOG) 100 UNIT/ML injection Inject 0-10 Units into the skin 2 (two) times daily. Sliding Scale:: 60-200= 0 units 201-250= 2 units 251-300= 4 units 301-350= 6 units 351-400= 8 units 401-450=10units   09/12/2016 at Unknown time  . ipratropium-albuterol (DUONEB) 0.5-2.5 (3) MG/3ML SOLN Take 3 mLs by nebulization every 6 (six) hours.   09/09/2016 at Unknown time  . Lactobacillus (ACIDOPHILUS PO) Take 1 tablet by mouth daily.    09/15/2016 at Unknown time  . megestrol (MEGACE) 40 MG/ML suspension Take by mouth daily.   09/12/2016 at Unknown time  . mirtazapine (REMERON) 15 MG tablet Take 15 mg by mouth at bedtime.   09/12/2016 at Unknown time  . omeprazole (PRILOSEC) 20 MG capsule Take 20 mg by mouth daily.   09/12/2016 at Unknown time  . ondansetron (ZOFRAN) 4 MG tablet Take 4 mg by mouth every 8 (eight) hours as needed for nausea or vomiting.   Past Week at Unknown time  . OXYGEN Inhale 3 L into the lungs daily. During day and evening shifts   08/21/2016 at Unknown time  . potassium chloride (KLOR-CON) 20 MEQ packet Take 20 mEq by mouth daily.   09/12/2016 at Unknown time  . sodium chloride (OCEAN) 0.65 % SOLN nasal spray Place 1 spray into both nostrils 3 (three) times daily.   09/12/2016 at Unknown time  . vitamin C (ASCORBIC ACID) 500 MG tablet Take 500 mg by mouth daily.   09/12/2016 at Unknown time  . predniSONE (DELTASONE) 10 MG tablet Take 10 mg by mouth daily with breakfast.   Not Taking at Unknown time   Scheduled: . acidophilus  1 capsule Oral Daily  . amLODipine  5 mg Oral Daily  . aspirin EC  325 mg Oral Daily  . cholecalciferol  1,000 Units Oral Daily  . docusate sodium  100 mg Oral BID  . enoxaparin (LOVENOX) injection  30 mg Subcutaneous Q24H  . feeding supplement  1 Container Oral TID BM  . feeding supplement (PRO-STAT 64)  30 mL Oral BID  . ferrous sulfate  300 mg Oral Daily  . fluticasone furoate-vilanterol  1 puff Inhalation Daily  . furosemide  40 mg Intravenous BID  . insulin  aspart  0-10 Units Subcutaneous BID  . ipratropium-albuterol  3 mL Nebulization TID  . mirtazapine  15 mg Oral QHS  . pantoprazole  40 mg Oral Daily  . polyethylene glycol  17 g Oral Daily  . predniSONE  40 mg Oral Q breakfast  . sodium chloride  1 spray Each Nare TID  . vitamin C  500 mg Oral Daily   Continuous: . albumin human    . ceFEPime (MAXIPIME) IV 1 g (09/16/16 0945)  .  sodium bicarbonate  infusion 1000 mL      JHE:[id number] **OR** acetaminophen, albuterol, ALPRAZolam, bisacodyl, calcium carbonate, guaiFENesin-dextromethorphan, ondansetron **OR** ondansetron (ZOFRAN) IV, sodium phosphate  Assesment: She has acute renal failure. This is being treated and is improving. She has COPD at baseline. She has a cavitary pneumonia which appears to be resolved by chest x-ray. She has significant anemia and may require a blood transfusion now. Principal Problem:   Acute renal failure (ARF) (HCC) Active Problems:   COPD (chronic obstructive pulmonary disease) (HCC)   Anemia   Thrombocytosis (HCC)   Leukocytosis   Hypoalbuminemia   Hyperglycemia   AKI (acute kidney injury) ([place])   Protein-calorie malnutrition, severe    Plan: Continue treatments. It does not appear that she's having active issues with her breathing so I will follow more peripherally    LOS: 2 days   Gerard Cantara L 09/16/2016, 9:56 AM

## 2016-09-16 NOTE — Progress Notes (Signed)
Pt is incontinent and has orders for 24 hour urine collection and urinalysis. MD E-Paged. No further instructions at this time.

## 2016-09-17 LAB — IRON AND TIBC
IRON: 103 ug/dL (ref 28–170)
Saturation Ratios: 92 % — ABNORMAL HIGH (ref 10.4–31.8)
TIBC: 112 ug/dL — AB (ref 250–450)
UIBC: 9 ug/dL

## 2016-09-17 LAB — PROTEIN, URINE, 24 HOUR
Collection Interval-UPROT: 24 hours
PROTEIN, 24H URINE: 511 mg/d — AB (ref 50–100)
Protein, Urine: 72 mg/dL
URINE TOTAL VOLUME-UPROT: 710 mL

## 2016-09-17 LAB — TYPE AND SCREEN
ABO/RH(D): A POS
ANTIBODY SCREEN: NEGATIVE
UNIT DIVISION: 0

## 2016-09-17 LAB — RENAL FUNCTION PANEL
ANION GAP: 10 (ref 5–15)
Albumin: 3 g/dL — ABNORMAL LOW (ref 3.5–5.0)
BUN: 90 mg/dL — ABNORMAL HIGH (ref 6–20)
CALCIUM: 8.4 mg/dL — AB (ref 8.9–10.3)
CO2: 15 mmol/L — AB (ref 22–32)
Chloride: 102 mmol/L (ref 101–111)
Creatinine, Ser: 2.88 mg/dL — ABNORMAL HIGH (ref 0.44–1.00)
GFR calc non Af Amer: 15 mL/min — ABNORMAL LOW (ref >60)
GFR, EST AFRICAN AMERICAN: 18 mL/min — AB (ref >60)
GLUCOSE: 93 mg/dL (ref 65–99)
PHOSPHORUS: 6.6 mg/dL — AB (ref 2.5–4.6)
POTASSIUM: 5 mmol/L (ref 3.5–5.1)
Sodium: 127 mmol/L — ABNORMAL LOW (ref 135–145)

## 2016-09-17 LAB — CBC
HEMATOCRIT: 20 % — AB (ref 36.0–46.0)
HEMOGLOBIN: 6.7 g/dL — AB (ref 12.0–15.0)
MCH: 30.6 pg (ref 26.0–34.0)
MCHC: 33.5 g/dL (ref 30.0–36.0)
MCV: 91.3 fL (ref 78.0–100.0)
Platelets: 340 10*3/uL (ref 150–400)
RBC: 2.19 MIL/uL — AB (ref 3.87–5.11)
RDW: 17.3 % — ABNORMAL HIGH (ref 11.5–15.5)
WBC: 6.8 10*3/uL (ref 4.0–10.5)

## 2016-09-17 LAB — BPAM RBC
Blood Product Expiration Date: 201807102359
ISSUE DATE / TIME: 201806231452
Unit Type and Rh: 6200

## 2016-09-17 LAB — FERRITIN: FERRITIN: 194 ng/mL (ref 11–307)

## 2016-09-17 LAB — BLOOD GAS, ARTERIAL
ACID-BASE DEFICIT: 12.9 mmol/L — AB (ref 0.0–2.0)
Bicarbonate: 14.2 mmol/L — ABNORMAL LOW (ref 20.0–28.0)
DRAWN BY: 23534
O2 Content: 2 L/min
O2 Saturation: 96.6 %
pCO2 arterial: 31.3 mmHg — ABNORMAL LOW (ref 32.0–48.0)
pH, Arterial: 7.242 — ABNORMAL LOW (ref 7.350–7.450)
pO2, Arterial: 91.9 mmHg (ref 83.0–108.0)

## 2016-09-17 LAB — HEPATITIS C ANTIBODY

## 2016-09-17 LAB — PREPARE RBC (CROSSMATCH)

## 2016-09-17 LAB — GLUCOSE, CAPILLARY
GLUCOSE-CAPILLARY: 96 mg/dL (ref 65–99)
Glucose-Capillary: 97 mg/dL (ref 65–99)
Glucose-Capillary: 110 mg/dL — ABNORMAL HIGH (ref 65–99)
Glucose-Capillary: 96 mg/dL (ref 65–99)

## 2016-09-17 LAB — HEPATITIS B SURFACE ANTIGEN: Hepatitis B Surface Ag: NEGATIVE

## 2016-09-17 LAB — RPR: RPR Ser Ql: NONREACTIVE

## 2016-09-17 MED ORDER — FUROSEMIDE 10 MG/ML IJ SOLN
100.0000 mg | Freq: Two times a day (BID) | INTRAVENOUS | Status: DC
  Administered 2016-09-17 – 2016-09-18 (×2): 100 mg via INTRAVENOUS
  Filled 2016-09-17 (×2): qty 10

## 2016-09-17 MED ORDER — STERILE WATER FOR INJECTION IV SOLN
INTRAVENOUS | Status: DC
  Administered 2016-09-17 – 2016-09-18 (×2): via INTRAVENOUS
  Filled 2016-09-17 (×4): qty 9.71

## 2016-09-17 MED ORDER — SODIUM CHLORIDE 0.9 % IV SOLN
Freq: Once | INTRAVENOUS | Status: AC
  Administered 2016-09-17: 15:00:00 via INTRAVENOUS

## 2016-09-17 NOTE — Progress Notes (Addendum)
Patient ID: Leah Dalton, female   DOB: Mar 12, 1946, 71 y.o.   MRN: [id number]  PROGRESS NOTE    VELENCIA LENART  WGN:[id number] DOB: [birth year] DOA: 09/09/2016  PCP: Crisoforo Oxford, MD   Brief Narrative:  71 year old female with GERD, hereditary hemochromatosis, polycythemia with history of phlebotomy, COPD> Pt presented to ED from nursing home with abnormal labs. Her hgb was less than 7 and creatinine was 1.4 (baseline 0.3).   Assessment & Plan:   Principal Problem:   Acute renal failure (ARF) (HCC) - Likely ATN / AIN / atheroembolic disease - Creatinine 2.88 this am, continues to trend up - Renal is following - Patient is on Lasix 40 mg IV twice a day - We are collecting 24-hour urine   Active Problems:   Hyponatremia - Likely due to Lasix  - Sodium slightly better this morning, 127     COPD (chronic obstructive pulmonary disease) (HCC) - Continue current bronchodilator treatment and prednisone     HCAP / Leukocytosis - We will continue cefepime - White blood cell count within normal limits - Continue oxygen support and nasal cannula to keep oxygen saturation above 90%    Iron deficiency anemia / anemia of chronic kidney disease - Continue ferrous sulfate - Hemoglobin is 6.7, status post 1 unit PRBC transfusion 09/16/2016 - We'll give 1 unit PRBC transfusion today    Protein-calorie malnutrition, severe - In the context of chronic illness - Diet as tolerated    Essential hypertension - Continue Norvasc   DVT prophylaxis:Stop Lovenox in the setting of anemia, use SCDs  Code Status: DNR/DNI Family Communication: Family not at the bedside this morning  Disposition Plan: Patient is not yet stable for discharge due to ongoing anemia, hyponatremia and worsening renal failure   Consultants:   Renal   Pulmonary   Procedures:   None   Antimicrobials:   Cefepime    Subjective: No overnight events.    Objective: Vitals:   09/16/16 2052 09/17/16 0615  09/17/16 0740 09/17/16 0752  BP: (!) 150/90 103/63    Pulse: (!) 101 99    Resp: 16 16    Temp: 97.5 F (36.4 C) 97.7 F (36.5 C)    TempSrc: Oral Oral    SpO2: 95% 100% 99% 99%  Weight:      Height:        Intake/Output Summary (Last 24 hours) at 09/17/16 0805 Last data filed at 09/17/16 0612  Gross per 24 hour  Intake          1293.75 ml  Output              450 ml  Net           843.75 ml   Filed Weights   09/10/2016 1153 08/29/2016 1700  Weight: 60.8 kg (134 lb) 60.8 kg (134 lb)    Examination:  Physical Exam  Constitutional: No distress.  Neck: Normal ROM. Neck supple. No JVD. No tracheal deviation. No thyromegaly.  CVS: RRR, S1/S2 +, Pulmonary: rhonchorous sounds  Abdominal: Soft. BS +,  no distension, tenderness, rebound or guarding.  Musculoskeletal: Normal range of motion. No edema and no tenderness.  Neuro: Alert. Normal reflexes, muscle tone coordination. No cranial nerve deficit. Skin: LE edema with ecchymoses, some edema in upper extremities as well Psychiatric: Normal mood and affect. Behavior, judgment, thought content normal.     Data Reviewed: I have personally reviewed following labs and imaging studies  CBC:  Recent Labs  Lab 09/12/2016 1216 09/14/16 0410 09/16/16 0506 09/17/16 0504  WBC 17.6* 14.0* 11.0* 6.8  NEUTROABS 14.6*  --   --   --   HGB 8.1* 7.1* 6.3* 6.7*  HCT 25.0* 22.0* 19.3* 20.0*  MCV 91.2 92.1 91.0 91.3  PLT 471* 460* 455* 527   Basic Metabolic Panel:  Recent Labs Lab 09/23/2016 1216 09/14/16 0410 09/15/16 0745 09/16/16 0506 09/17/16 0504  NA 133* 130* 128* 125* 127*  K 5.2* 5.0 4.9 5.1 5.0  CL 105 103 103 101 102  CO2 21* 19* 17* 15* 15*  GLUCOSE 110* 127* 85 92 93  BUN 61* 71* 78* 83* 90*  CREATININE 1.42* 1.76* 2.31* 2.63* 2.88*  CALCIUM 9.8 8.9 8.2* 8.1* 8.4*  PHOS  --   --   --  5.7* 6.6*   GFR: Estimated Creatinine Clearance: 15.5 mL/min (A) (by C-G formula based on SCr of 2.88 mg/dL (H)). Liver Function  Tests:  Recent Labs Lab 09/16/2016 1216 09/16/16 0506 09/17/16 0504  AST 15  --   --   ALT 17  --   --   ALKPHOS 72  --   --   BILITOT 0.4  --   --   PROT 5.1*  --   --   ALBUMIN 2.0* 1.9* 3.0*   No results for input(s): LIPASE, AMYLASE in the last 168 hours. No results for input(s): AMMONIA in the last 168 hours. Coagulation Profile:  Recent Labs Lab 09/03/2016 1251  INR 1.04   Cardiac Enzymes: No results for input(s): CKTOTAL, CKMB, CKMBINDEX, TROPONINI in the last 168 hours. BNP (last 3 results) No results for input(s): PROBNP in the last 8760 hours. HbA1C: No results for input(s): HGBA1C in the last 72 hours. CBG:  Recent Labs Lab 09/16/16 0732 09/16/16 1113 09/16/16 1617 09/16/16 2049 09/17/16 0722  GLUCAP 96 101* 93 100* 97   Lipid Profile: No results for input(s): CHOL, HDL, LDLCALC, TRIG, CHOLHDL, LDLDIRECT in the last 72 hours. Thyroid Function Tests: No results for input(s): TSH, T4TOTAL, FREET4, T3FREE, THYROIDAB in the last 72 hours. Anemia Panel: No results for input(s): VITAMINB12, FOLATE, FERRITIN, TIBC, IRON, RETICCTPCT in the last 72 hours. Urine analysis:    Component Value Date/Time   COLORURINE STRAW (A) 09/16/2016 1706   APPEARANCEUR HAZY (A) 09/16/2016 1706   LABSPEC 1.008 09/16/2016 1706   PHURINE 5.0 09/16/2016 1706   GLUCOSEU NEGATIVE 09/16/2016 1706   HGBUR SMALL (A) 09/16/2016 1706   BILIRUBINUR NEGATIVE 09/16/2016 1706   KETONESUR NEGATIVE 09/16/2016 1706   PROTEINUR 30 (A) 09/16/2016 1706   NITRITE NEGATIVE 09/16/2016 1706   LEUKOCYTESUR MODERATE (A) 09/16/2016 1706   Sepsis Labs: @LABRCNTIP (procalcitonin:4,lacticidven:4)    MRSA PCR Screening     Status: None   Collection Time: 08/27/2016  9:00 PM  Result Value Ref Range Status   MRSA by PCR NEGATIVE NEGATIVE Final      Radiology Studies: US Renal Result Date: 09/14/2016 Prominence of renal pyramids bilaterally in a symmetric manner. Suspect anatomic variant. Study  otherwise unremarkable.   Dg Chest Portable 1 View Result Date: 09/12/2016  Marked improvement in the infiltrates in the right upper lobe medially and in the right lung base. Irregular nodular density in the right upper lobe persists. I recommend a CT scan of the chest without contrast in 2 months for further evaluation of this area.     Scheduled Meds: . acidophilus  1 capsule Oral Daily  . amLODipine  5 mg Oral Daily  . aspirin EC  325 mg Oral Daily  . cholecalciferol  1,000 Units Oral Daily  . docusate sodium  100 mg Oral BID  . enoxaparin (LOVENOX) injection  30 mg Subcutaneous Q24H  . feeding supplement  1 Container Oral TID BM  . feeding supplement (PRO-STAT 64)  30 mL Oral BID  . ferrous sulfate  300 mg Oral Daily  . fluticasone furoate-vilanterol  1 puff Inhalation Daily  . furosemide  40 mg Intravenous BID  . insulin aspart  0-10 Units Subcutaneous BID  . ipratropium-albuterol  3 mL Nebulization TID  . mirtazapine  15 mg Oral QHS  . pantoprazole  40 mg Oral Daily  . polyethylene glycol  17 g Oral Daily  . predniSONE  40 mg Oral Q breakfast  . sodium chloride  1 spray Each Nare TID  . vitamin C  500 mg Oral Daily   Continuous Infusions: . sodium chloride 75 mL/hr at 09/16/16 2107  . albumin human    . ceFEPime (MAXIPIME) IV Stopped (09/16/16 1015)     LOS: 3 days    Time spent: 25 minutes  Greater than 50% of the time spent on counseling and coordinating the care.   Leisa Lenz, MD [facility] Pager [phone]  If 7PM-7AM, please contact night-coverage [email or web address] Password [email or web address] 09/17/2016, 8:05 AM

## 2016-09-17 NOTE — Progress Notes (Signed)
Subjective: Interval History: Patient complains of not feeling good. Her appetite is poor. She didn't have any nausea or vomiting. Her breathing is more or less the same. Complains of also weakness.  Objective: Vital signs in last 24 hours: Temp:  [97.5 F (36.4 C)-98.3 F (36.8 C)] 97.7 F (36.5 C) (06/24 0615) Pulse Rate:  [70-106] 99 (06/24 0615) Resp:  [16-20] 16 (06/24 0615) BP: (103-150)/(63-92) 103/63 (06/24 0615) SpO2:  [95 %-100 %] 99 % (06/24 0752) Weight change:   Intake/Output from previous day: 06/23 0701 - 06/24 0700 In: 1413.8 [P.O.:120; I.V.:708.8; Blood:335; IV Piggyback:250] Out: 450 [Urine:450] Intake/Output this shift: No intake/output data recorded.  General appearance: alert, cooperative and no distress Resp: diminished breath sounds bilaterally Cardio: irregularly irregular rhythm Extremities: edema 1-2+ edema  Lab Results:  Recent Labs  09/16/16 0506 09/17/16 0504  WBC 11.0* 6.8  HGB 6.3* 6.7*  HCT 19.3* 20.0*  PLT 455* 340   BMET:   Recent Labs  09/16/16 0506 09/17/16 0504  NA 125* 127*  K 5.1 5.0  CL 101 102  CO2 15* 15*  GLUCOSE 92 93  BUN 83* 90*  CREATININE 2.63* 2.88*  CALCIUM 8.1* 8.4*   No results for input(s): PTH in the last 72 hours. Iron Studies: No results for input(s): IRON, TIBC, TRANSFERRIN, FERRITIN in the last 72 hours.  Studies/Results: No results found.  I have reviewed the patient's current medications.  Assessment/Plan: Problem #1 renal failure: Possibly acute on chronic. Presently her BUN and creatinine is increasing.She has some nausea. Problem #2 anasarca: At this moment etiology not clear. Patient with severe hypoalbuminemia of 1.9. She is on Lasix and albumin. Her urine output is 450 still low. Problem #3 anemia: Her hemoglobin and hematocrit is declining. Possibly iron deficiency anemia. Patient with history of polycythemia. Her hemoglobin is declining. Problem #4 hypertension: Her blood pressure is  reasonably controlled Problem #5 history of COPD : She is on oxygen and is feeling better Problem #6 history of hemachromatosis Problem #7 bone and metabolic disorder: Her calcium  is a range but her phosphorus is high. Problem #8 hypervolemic hyponatremia: Possibly from intravascular depletion with increased free water intake. Her sodium is 127 and improving. Problem #9 low CO2 : Seems to be secondary to metabolic acidosis. Plan: 1] We'll continue with albumin 2] we'll increase her Lasix to 100 mg IV twice a day 3] We will change her ivf to 1/4 saline with 100 meq of sodium bicarbonate at 75 cc/hr 4] we'll check renal panel and CBC in the morning.  LOS: 3 days   Ernie Sagrero S 09/17/2016,10:37 AM

## 2016-09-17 NOTE — Progress Notes (Signed)
24 hour urine collection started at 2045. Urine drained from foley into collection container and placed on ice. Continue to monitor

## 2016-09-18 ENCOUNTER — Encounter (HOSPITAL_COMMUNITY): Payer: Self-pay | Admitting: Primary Care

## 2016-09-18 DIAGNOSIS — J439 Emphysema, unspecified: Secondary | ICD-10-CM

## 2016-09-18 DIAGNOSIS — Z515 Encounter for palliative care: Secondary | ICD-10-CM

## 2016-09-18 DIAGNOSIS — Z7189 Other specified counseling: Secondary | ICD-10-CM

## 2016-09-18 LAB — RENAL FUNCTION PANEL
Albumin: 3.8 g/dL (ref 3.5–5.0)
Anion gap: 12 (ref 5–15)
BUN: 100 mg/dL — AB (ref 6–20)
CHLORIDE: 99 mmol/L — AB (ref 101–111)
CO2: 17 mmol/L — ABNORMAL LOW (ref 22–32)
Calcium: 8.6 mg/dL — ABNORMAL LOW (ref 8.9–10.3)
Creatinine, Ser: 3.11 mg/dL — ABNORMAL HIGH (ref 0.44–1.00)
GFR, EST AFRICAN AMERICAN: 16 mL/min — AB (ref >60)
GFR, EST NON AFRICAN AMERICAN: 14 mL/min — AB (ref >60)
Glucose, Bld: 91 mg/dL (ref 65–99)
POTASSIUM: 4.5 mmol/L (ref 3.5–5.1)
Phosphorus: 6.6 mg/dL — ABNORMAL HIGH (ref 2.5–4.6)
Sodium: 128 mmol/L — ABNORMAL LOW (ref 135–145)

## 2016-09-18 LAB — CBC
HEMATOCRIT: 24.1 % — AB (ref 36.0–46.0)
HEMOGLOBIN: 8.3 g/dL — AB (ref 12.0–15.0)
MCH: 30.7 pg (ref 26.0–34.0)
MCHC: 34.4 g/dL (ref 30.0–36.0)
MCV: 89.3 fL (ref 78.0–100.0)
Platelets: 348 10*3/uL (ref 150–400)
RBC: 2.7 MIL/uL — AB (ref 3.87–5.11)
RDW: 17.4 % — ABNORMAL HIGH (ref 11.5–15.5)
WBC: 12.4 10*3/uL — AB (ref 4.0–10.5)

## 2016-09-18 LAB — GLUCOSE, CAPILLARY
GLUCOSE-CAPILLARY: 97 mg/dL (ref 65–99)
GLUCOSE-CAPILLARY: 88 mg/dL (ref 65–99)

## 2016-09-18 LAB — BPAM RBC
BLOOD PRODUCT EXPIRATION DATE: 201807102359
ISSUE DATE / TIME: 201806241509
UNIT TYPE AND RH: 6200

## 2016-09-18 LAB — TYPE AND SCREEN
ABO/RH(D): A POS
ANTIBODY SCREEN: NEGATIVE
UNIT DIVISION: 0

## 2016-09-18 LAB — ANTINUCLEAR ANTIBODIES, IFA: ANA Ab, IFA: NEGATIVE

## 2016-09-18 MED ORDER — BOOST / RESOURCE BREEZE PO LIQD: 1.0000 | Freq: Three times a day (TID) | ORAL | Status: DC | PRN

## 2016-09-18 MED ORDER — FUROSEMIDE 10 MG/ML IJ SOLN
200.0000 mg | Freq: Two times a day (BID) | INTRAVENOUS | Status: DC
  Filled 2016-09-18 (×2): qty 20

## 2016-09-18 MED ORDER — SODIUM CHLORIDE 0.9 % IV SOLN
1.0000 mg/h | INTRAVENOUS | Status: DC
  Administered 2016-09-18: 2 mg/h via INTRAVENOUS
  Administered 2016-09-19: 5 mg/h via INTRAVENOUS
  Filled 2016-09-18 (×2): qty 10

## 2016-09-18 NOTE — Progress Notes (Signed)
Daily Progress Note   Patient Name: Leah Dalton       Date: 09/18/2016 DOB: 18-Apr-1945  Age: 71 y.o. MRN#: [id number] Attending Physician: Robbie Lis, MD Primary Care Physician: Crisoforo Oxford, MD Admit Date: 08/28/2016  Reason for Consultation/Follow-up: Establishing goals of care, Non pain symptom management, Psychosocial/spiritual support and Withdrawal of life-sustaining treatment  Subjective: Leah Dalton is resting quietly in bed. She does not open her eyes unless I ask her to. She states she is having pain. Nursing tech is at bedside checking blood sugars, and when asked, Leah Dalton states that she prefers not to be stuck again. She tells me that she is having pain "all over".  Family meeting with son Nicki Reaper and daughter-in-law Hilda Blades. Son Nicki Reaper states that he understands that his mother is dying. I share that I do not believe we can change what is happening, but that we can make sure that this time is comfortable for Leah Dalton. Nicki Reaper states that their goal is comfort and dignity, and they are requesting milk morphine continuous infusion for pain relief. We talk about unburdening Leah Dalton from bicarbonate drip, IV Lasix gtt, and other medications that are not changing what is happening. Family readily agrees. Hilda Blades shares that Leah Dalton has told her that she is ready to die. Leah Dalton tells me she would like to be unburdened from treatments that aren't helping her. She wants to be pain free. Morphine continuous infusion started.  Conference with nursing staff related to comfort measures. Conference with Dr. Charlies Silvers regarding transition to comfort measures only.  Length of Stay: 4  Current Medications: Scheduled Meds:  . acidophilus  1 capsule Oral Daily  .  amLODipine  5 mg Oral Daily  . aspirin EC  325 mg Oral Daily  . cholecalciferol  1,000 Units Oral Daily  . docusate sodium  100 mg Oral BID  . feeding supplement  1 Container Oral TID BM  . feeding supplement (PRO-STAT 64)  30 mL Oral BID  . ferrous sulfate  300 mg Oral Daily  . fluticasone furoate-vilanterol  1 puff Inhalation Daily  . insulin aspart  0-10 Units Subcutaneous BID  . ipratropium-albuterol  3 mL Nebulization TID  . mirtazapine  15 mg Oral QHS  . pantoprazole  40 mg Oral Daily  . polyethylene glycol  17 g Oral Daily  . predniSONE  40 mg Oral Q breakfast  . sodium chloride  1 spray Each Nare TID  . vitamin C  500 mg Oral Daily    Continuous Infusions: . albumin human    . ceFEPime (MAXIPIME) IV Stopped (09/17/16 0836)  . furosemide    .  sodium bicarbonate infusion 1/4 NS 1000 mL 50 mL/hr at 09/18/16 0901    PRN Meds: acetaminophen **OR** acetaminophen, albuterol, ALPRAZolam, bisacodyl, calcium carbonate, guaiFENesin-dextromethorphan, ondansetron **OR** ondansetron (ZOFRAN) IV, sodium phosphate  Physical Exam  Constitutional: No distress.  Opens eyes to commands, appears critically ill  HENT:  Head: Atraumatic.  Cardiovascular: Normal rate and regular rhythm.   Pulmonary/Chest: Effort normal. No respiratory distress.  Abdominal: Soft.  Musculoskeletal: She exhibits edema.  Neurological: She is alert.  Opens eyes to command, calm and cooperative  Skin: Skin is warm and dry.  Anasarca, blisters bilateral arms  Nursing note and vitals reviewed.           Vital Signs: BP 122/84 (BP Location: Left Wrist)   Pulse (!) 102   Temp 97.6 F (36.4 C) (Oral)   Resp 19   Ht 5\' 4"  (1.626 m)   Wt 60.8 kg (134 lb)   SpO2 98%   BMI 23.00 kg/m  SpO2: SpO2: 98 % O2 Device: O2 Device: Nasal Cannula O2 Flow Rate: O2 Flow Rate (L/min): 2 L/min  Intake/output summary:  Intake/Output Summary (Last 24 hours) at 09/18/16 1116 Last data filed at 09/18/16 0900  Gross per  24 hour  Intake          2055.25 ml  Output             1700 ml  Net           355.25 ml   LBM: Last BM Date: 08/25/2016 Baseline Weight: Weight: 60.8 kg (134 lb) Most recent weight: Weight: 60.8 kg (134 lb)       Palliative Assessment/Data:    Flowsheet Rows     Most Recent Value  Intake Tab  Referral Department  Hospitalist  Unit at Time of Referral  Med/Surg Unit  Palliative Care Primary Diagnosis  Pulmonary  Date Notified  09/18/16  Palliative Care Type  Return patient Palliative Care  Reason for referral  Clarify Goals of Care  Date of Admission  08/25/2016  Date first seen by Palliative Care  09/18/16  # of days Palliative referral response time  0 Day(s)  # of days IP prior to Palliative referral  5  Clinical Assessment  Palliative Performance Scale Score  20%  Pain Max last 24 hours  Not able to report  Pain Min Last 24 hours  Not able to report  Dyspnea Max Last 24 Hours  Not able to report  Dyspnea Min Last 24 hours  Not able to report  Psychosocial & Spiritual Assessment  Palliative Care Outcomes  Patient/Family meeting held?  Yes  Who was at the meeting?  Patient, son Nicki Reaper and daughter-in-law Neoma Laming  Palliative Care Outcomes  Clarified goals of care, Changed to focus on comfort  Patient/Family wishes: Interventions discontinued/not started   Mechanical Ventilation      Patient Active Problem List   Diagnosis Date Noted  . Protein-calorie malnutrition, severe 09/15/2016  . AKI (acute kidney injury) ([place]) 09/14/2016  . Acute renal failure (ARF) ([place]) 09/08/2016  . Hyperglycemia 08/30/2016  . Encounter for hospice care discussion   . Palliative care encounter   . Goals of  care, counseling/discussion   . DNR (do not resuscitate) discussion   . Hypoalbuminemia   . Thrombocytosis ([place]) 08/21/2016  . Leukocytosis 08/21/2016  . Positive D dimer 08/21/2016  . HCAP (healthcare-associated pneumonia) 08/21/2016  . Pneumonia of right middle lobe due to infectious  organism ([place])   . Acute hypokalemia 02/20/2016  . 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 AVM (arteriovenous malformation) of colon 04/10/2014  . IDA (iron deficiency anemia) 04/10/2014  . External hemorrhoids 02/24/2014  . Reflux esophagitis   . Anemia 02/22/2014  . COPD (chronic obstructive pulmonary disease) ([place]) 06/07/2012  . Acute-on-chronic respiratory failure ([place]) 06/07/2012  . Polycythemia 06/07/2012  . Hyponatremia 06/07/2012    Palliative Care Assessment & Plan   Patient Profile: Leah Dalton is a 71 year old female with a history of GERD, hereditary hemochromatosis, polycythemia with history of phlebotomy, COPD, nursing home residents since November 2017, bedbound since January 2018. She presented to ED from [name] [place] home with abnormal labs. Her hgb was less than 7 and creatinine was 1.4 (baseline 0.3).  Assessment: end-stage COPD; no acute respiratory distress at this time. Starting morphine continuous infusion for dyspnea and pain. end of life; Leah Dalton is shared with her daughter-in-law Neoma Laming that she is ready to die. She tells me she would like to be unburdened from treatments that aren't helping her. She wants to be pain free. Morphine continuous infusion started.  Recommendations/Plan:  morphine infusion for comfort care. Anticipated death within hours to days. We have discussed transfer to hospice home. Family is requesting [place].  Goals of Care and Additional Recommendations:  Limitations on Scope of Treatment: Full Comfort Care  Code Status:    Code Status Orders        Start     Ordered   08/26/2016 1635  Do not attempt resuscitation (DNR)  Continuous    Question Answer Comment  In the event of cardiac or respiratory ARREST Do not call a "code blue"   In the event of cardiac or respiratory ARREST Do not perform Intubation, CPR, defibrillation or ACLS   In the event of cardiac or respiratory ARREST Use medication by any  route, position, wound care, and other measures to relive pain and suffering. May use oxygen, suction and manual treatment of airway obstruction as needed for comfort.      09/08/2016 1634    Code Status History    Date Active Date Inactive Code Status Order ID Comments User Context   08/25/2016  1:20 PM 08/28/2016  5:16 PM DNR [id number]  Drue Novel, NP Inpatient   08/21/2016  6:26 PM 08/25/2016  1:20 PM Full Code [id number]  Vianne Bulls, MD ED   03/09/2016  5:22 PM 03/15/2016  4:41 PM Full Code [id number]  Sinda Du, MD Inpatient   02/20/2016  3:02 PM 02/26/2016  2:31 PM Full Code [id number]  Orvan Falconer, MD Inpatient   02/22/2014 11:14 AM 02/24/2014  3:13 PM Full Code [id number]  Donne Hazel, MD ED   06/07/2012  4:34 PM 06/10/2012  7:58 PM Full Code [id number]  Rexene Alberts, MD ED       Prognosis:   Hours - Days  Discharge Planning:  Anticipated Hospital Death  Care plan was discussed with nursing staff, case manager, social worker, and Dr. Rogers Blocker.  Thank you for allowing the Palliative Medicine Team to assist in the care of this patient.   Time In: 1050 Time Out: 1130 Total Time 40 minutes Prolonged Time Billed  no  Greater than 50%  of this time was spent counseling and coordinating care related to the above assessment and plan.  Drue Novel, NP  Please contact Palliative Medicine Team phone at [phone] for questions and concerns.

## 2016-09-18 NOTE — Progress Notes (Signed)
Present with Ms. Rossetti and her granddaughter Ebony Hail for support. I also spoke with her daughter-in-law Hilda Blades who shared about family/faith dynamics. Through her grief she shared about Ms [place] (her mother died 8 years ago and Hilda Blades stated Ms Andujo had not really healed since) and the tasks she is taking up to help the family in this transition. They are making sure family is able to be present with Ms [facility].

## 2016-09-18 NOTE — Progress Notes (Signed)
OT Cancellation Note  Patient Details Name: Leah Dalton MRN: [id number] DOB: [birth year]   Cancelled Treatment:    Reason Eval/Treat Not Completed: Medical issues which prohibited therapy;Fatigue/lethargy limiting ability to participate. Patient extremely tired and weak. Not eating at this time. Will refrain from completing OT evaluation at this time. If OT does not get to complete evaluation while patient is in acute, I recommend that she discharge back to [facility] be evaluated by staff OT.   [name], OTR/L,CBIS  [phone]  09/18/2016, 10:05 AM

## 2016-09-18 NOTE — Progress Notes (Signed)
Subjective: Interval History: Patient is very sleepy but arousable. Patient with poor appetite and not eating.  Objective: Vital signs in last 24 hours: Temp:  [97.3 F (36.3 C)-98 F (36.7 C)] 97.6 F (36.4 C) (06/25 0429) Pulse Rate:  [97-106] 102 (06/25 0429) Resp:  [16-19] 19 (06/25 0429) BP: (105-137)/(61-84) 122/84 (06/25 0429) SpO2:  [95 %-100 %] 98 % (06/25 0742) Weight change:   Intake/Output from previous day: 06/24 0701 - 06/25 0700 In: 2055.3 [P.O.:240; I.V.:906.3; Blood:659; IV Piggyback:250] Out: 850 [Urine:850] Intake/Output this shift: No intake/output data recorded.  Patient very somnolent but arousable. She answers questions by shaking her head. Chest: Shows some inspiratory crackles Heart exam revealed regular rate and rhythm no murmur Extremities: She has bilateral edema, multiple ecchymosis on post lower and upper arm.  Lab Results:  Recent Labs  09/17/16 0504 09/18/16 0531  WBC 6.8 12.4*  HGB 6.7* 8.3*  HCT 20.0* 24.1*  PLT 340 348   BMET:   Recent Labs  09/17/16 0504 09/18/16 0531  NA 127* 128*  K 5.0 4.5  CL 102 99*  CO2 15* 17*  GLUCOSE 93 91  BUN 90* 100*  CREATININE 2.88* 3.11*  CALCIUM 8.4* 8.6*   No results for input(s): PTH in the last 72 hours. Iron Studies:   Recent Labs  09/17/16 0504  IRON 103  TIBC 112*  FERRITIN 194    Studies/Results: No results found.  I have reviewed the patient's current medications.  Assessment/Plan: Problem #1 renal failure: Possibly acute on chronic. Presently her BUN and creatinine Continued to increase. Her potassium is normal. Patient remains with poor appetite since her admission. Problem #2 anasarca: Patient on albumin and Lasix. She has 850 mL of urine output but still with significant sign of fluid overload. Problem #3 anemia: Her hemoglobin and hematocrit is low but stable. Problem #4 hypertension: Her blood pressure is reasonably controlled Problem #5 history of COPD : She is  on oxygen and is feeling better Problem #6 history of hemachromatosis Problem #7 bone and metabolic disorder: Her calcium  is a range but her phosphorus is high. Problem #8 hypervolemic hyponatremia: Possibly from intravascular depletion with increased free water intake. Her sodium is 128 slowly improving  Problem #9 low CO2 : Seems to be secondary to metabolic acidosis.She is on sodium bicarbonate for CO2 is 17 and improving.  Plan: 1] We'll continue with albumin 2] we'll increase her Lasix to 200 mg IV twice a day 3] We will change her ivf to 1/4 saline with 100 meq of sodium bicarbonate at 50 cc/hr 4] we'll check renal panel and CBC in the morning. 5] at this moment the only option we have is doing dialysis. However at this moment unable to get much information from patient. We may need to discuss with her families about her situation. Overall patient with poor prognosis and poor quality of life. If families decide with dialysis will ask surgery to put a femoral catheter and initiate dialysis possibly tomorrow.   LOS: 4 days   Aasia Peavler S 09/18/2016,8:45 AM

## 2016-09-18 NOTE — Progress Notes (Signed)
PT output only 250 from 7p-7a. Pt rhonchi and congested breathing. Educated on turn cough and deep breath. Unable to expel secretions. O2 sats WDL on 2L nasal cannula. Pt very weak and not eating or taking in adequate amounts of PO fluids. Continue to monitor.

## 2016-09-18 NOTE — Progress Notes (Addendum)
Patient ID: Leah Dalton, female   DOB: November 06, 1945, 71 y.o.   MRN: [id number]  PROGRESS NOTE    Leah Dalton  Leah Dalton DOB: 07/22/1945 DOA: 09/22/2016  PCP: Leah Oxford, MD   Brief Narrative:  71 year old female with GERD, hereditary hemochromatosis, polycythemia with history of phlebotomy, COPD. Pt presented to ED from nursing home with abnormal labs. Her hgb was less than 7 and creatinine was 1.4 (baseline 0.3).  Pt continued to decline clinically during this hospital stay so palliative was consulted and family requested comfort care.  Assessment & Plan:   Principal Problem:   Acute renal failure (ARF) (HCC) - Likely ATN / AIN / atheroembolic disease - Cr continues to trend up likely due to lasix  - Family requested comfort care so will try to minimize medications to ensure comfort   Active Problems:   Hyponatremia - Likely due to Lasix  - Sodium is 128 this am    COPD (chronic obstructive pulmonary disease) ([place]) - Continue current nebulizer treatments     HCAP / Leukocytosis - Pt on cefepime - Continue oxygen support via [place]     Iron deficiency anemia / anemia of chronic kidney disease - Continue ferrous sulfate - S/p 1 U PRBC transfusion 6/23 - Hgb 8.3 this am    Protein-calorie malnutrition, severe - In the context of chronic illness - Comfort care at this point     Essential hypertension - Continue Norvasc    DVT prophylaxis:SCD's Code Status: DNR/DNI Family Communication: No family at the bedside this ma  Disposition Plan: Not yet stable for discharge    Consultants:   Renal   Pulmonary   Palliative care   Procedures:   None   Antimicrobials:   Cefepime    Subjective: More rhonchorous this am.   Objective: Vitals:   09/17/16 2125 09/18/16 0429 09/18/16 0732 09/18/16 0742  BP: 127/72 122/84    Pulse: (!) 106 (!) 102    Resp: 18 19    Temp: 97.5 F (36.4 C) 97.6 F (36.4 C)    TempSrc: Oral Oral    SpO2: 100% 100% 98%  98%  Weight:      Height:        Intake/Output Summary (Last 24 hours) at 09/18/16 1138 Last data filed at 09/18/16 0900  Gross per 24 hour  Intake          2055.25 ml  Output             1700 ml  Net           355.25 ml   Filed Weights   09/16/2016 1153 09/02/2016 1700  Weight: 60.8 kg (134 lb) 60.8 kg (134 lb)    Examination:  Physical Exam   General exam: apperas ill Respiratory system: rhonchorous, no wheezing  Cardiovascular system: rate controlled, S1, S2 (+) Gastrointestinal system: (+) BS, non tender abd Central nervous system: Awakens when called her name but lethargic  Extremities: 2-3 + LE edema with ecchymoses, redness over upper extremities  Skin: redness in UE bilaterally, scattered ecchymoses on legs  Psychiatry: No agitation or restlessness    Data Reviewed: I have personally reviewed following labs and imaging studies  CBC:  Recent Labs Lab 09/16/2016 1216 09/14/16 0410 09/16/16 0506 09/17/16 0504 09/18/16 0531  WBC 17.6* 14.0* 11.0* 6.8 12.4*  NEUTROABS 14.6*  --   --   --   --   HGB 8.1* 7.1* 6.3* 6.7* 8.3*  HCT 25.0* 22.0*  19.3* 20.0* 24.1*  MCV 91.2 92.1 91.0 91.3 89.3  PLT 471* 460* 455* 340 409   Basic Metabolic Panel:  Recent Labs Lab 09/14/16 0410 09/15/16 0745 09/16/16 0506 09/17/16 0504 09/18/16 0531  NA 130* 128* 125* 127* 128*  K 5.0 4.9 5.1 5.0 4.5  CL 103 103 101 102 99*  CO2 19* 17* 15* 15* 17*  GLUCOSE 127* 85 92 93 91  BUN 71* 78* 83* 90* 100*  CREATININE 1.76* 2.31* 2.63* 2.88* 3.11*  CALCIUM 8.9 8.2* 8.1* 8.4* 8.6*  PHOS  --   --  5.7* 6.6* 6.6*   GFR: Estimated Creatinine Clearance: 14.3 mL/min (A) (by C-G formula based on SCr of 3.11 mg/dL (H)). Liver Function Tests:  Recent Labs Lab 09/22/2016 1216 09/16/16 0506 09/17/16 0504 09/18/16 0531  AST 15  --   --   --   ALT 17  --   --   --   ALKPHOS 72  --   --   --   BILITOT 0.4  --   --   --   PROT 5.1*  --   --   --   ALBUMIN 2.0* 1.9* 3.0* 3.8   No  results for input(s): LIPASE, AMYLASE in the last 168 hours. No results for input(s): AMMONIA in the last 168 hours. Coagulation Profile:  Recent Labs Lab 08/26/2016 1251  INR 1.04   Cardiac Enzymes: No results for input(s): CKTOTAL, CKMB, CKMBINDEX, TROPONINI in the last 168 hours. BNP (last 3 results) No results for input(s): PROBNP in the last 8760 hours. HbA1C: No results for input(s): HGBA1C in the last 72 hours. CBG:  Recent Labs Lab 09/17/16 1110 09/17/16 1619 09/17/16 2122 09/18/16 0800 09/18/16 1128  GLUCAP 110* 96 96 97 88   Lipid Profile: No results for input(s): CHOL, HDL, LDLCALC, TRIG, CHOLHDL, LDLDIRECT in the last 72 hours. Thyroid Function Tests: No results for input(s): TSH, T4TOTAL, FREET4, T3FREE, THYROIDAB in the last 72 hours. Anemia Panel:  Recent Labs  09/17/16 0504  FERRITIN 194  TIBC 112*  IRON 103   Urine analysis:    Component Value Date/Time   COLORURINE STRAW (A) 09/16/2016 1706   APPEARANCEUR HAZY (A) 09/16/2016 1706   LABSPEC 1.008 09/16/2016 1706   PHURINE 5.0 09/16/2016 1706   GLUCOSEU NEGATIVE 09/16/2016 1706   HGBUR SMALL (A) 09/16/2016 1706   BILIRUBINUR NEGATIVE 09/16/2016 1706   KETONESUR NEGATIVE 09/16/2016 1706   PROTEINUR 30 (A) 09/16/2016 1706   NITRITE NEGATIVE 09/16/2016 1706   LEUKOCYTESUR MODERATE (A) 09/16/2016 1706   Sepsis Labs: @LABRCNTIP (procalcitonin:4,lacticidven:4)    MRSA PCR Screening     Status: None   Collection Time: 09/12/2016  9:00 PM  Result Value Ref Range Status   MRSA by PCR NEGATIVE NEGATIVE Final      Radiology Studies: US Renal Result Date: 09/14/2016 Prominence of renal pyramids bilaterally in a symmetric manner. Suspect anatomic variant. Study otherwise unremarkable.   Dg Chest Portable 1 View Result Date: 08/26/2016  Marked improvement in the infiltrates in the right upper lobe medially and in the right lung base. Irregular nodular density in the right upper lobe persists. I  recommend a CT scan of the chest without contrast in 2 months for further evaluation of this area.     Scheduled Meds: . acidophilus  1 capsule Oral Daily  . amLODipine  5 mg Oral Daily  . aspirin EC  325 mg Oral Daily  . cholecalciferol  1,000 Units Oral Daily  . docusate  sodium  100 mg Oral BID  . feeding supplement  1 Container Oral TID BM  . feeding supplement (PRO-STAT 64)  30 mL Oral BID  . ferrous sulfate  300 mg Oral Daily  . fluticasone furoate-vilanterol  1 puff Inhalation Daily  . insulin aspart  0-10 Units Subcutaneous BID  . ipratropium-albuterol  3 mL Nebulization TID  . mirtazapine  15 mg Oral QHS  . pantoprazole  40 mg Oral Daily  . polyethylene glycol  17 g Oral Daily  . predniSONE  40 mg Oral Q breakfast  . sodium chloride  1 spray Each Nare TID  . vitamin C  500 mg Oral Daily   Continuous Infusions: . albumin human    . ceFEPime (MAXIPIME) IV Stopped (09/17/16 0836)  . furosemide    .  sodium bicarbonate infusion 1/4 NS 1000 mL 50 mL/hr at 09/18/16 0901     LOS: 4 days    Time spent: 25 minutes  Greater than 50% of the time spent on counseling and coordinating the care.   Leisa Lenz, MD [facility] Pager [phone]  If 7PM-7AM, please contact night-coverage [email or web address] Password [facility] 09/18/2016, 11:38 AM

## 2016-09-19 LAB — MPO/PR-3 (ANCA) ANTIBODIES
ANCA Proteinase 3: <3.5 U/mL (ref 0.0–3.5)
Myeloperoxidase Abs: <9 U/mL (ref 0.0–9.0)

## 2016-09-20 LAB — ANTISTREPTOLYSIN O TITER: ASO: 26 IU/mL (ref 0.0–200.0)

## 2016-09-24 NOTE — Consult Note (Signed)
   [facility] CM Inpatient Consult   10-16-2016  Leah Dalton 1946/02/02 [id number]  Patient screened for potential [name] Management services. Patient is on the [facility] registry as a benefit of their [employer] medicare . Electronic medical record reveals patient is a resident at the [facility] and there were no identifiable [facility] care management needs. [facility] Care Management services not appropriate at this time. If patient's post hospital needs change please place a [facility] Care Management consult. For questions please contact:   Sheena Donegan RN, [facility] Liaison  [phone]) Business Mobile [phone]) Toll free office

## 2016-09-24 NOTE — Progress Notes (Signed)
Daily Progress Note   Patient Name: Leah Dalton       Date: Oct 08, 2016 DOB: 1945/09/26  Age: 71 y.o. MRN#: [id number] Attending Physician: Robbie Lis, MD Primary Care Physician: Crisoforo Oxford, MD Admit Date: 09/20/2016  Reason for Consultation/Follow-up: Establishing goals of care, Non pain symptom management and Psychosocial/spiritual support  Subjective: Leah Dalton is resting quietly in bed. She is near end of life, with agonal breathing and long periods of apnea. She's does not respond to voice her touch, and seems relatively comfortable. Daughter-in-law Leah Dalton is at bedside. Leah Dalton shares that they have been singing and telling stories. Leah Dalton states that she stayed the night with Leah Dalton and will occasionally reach out and touch her hand.  Son Leah Dalton is in the family waiting room with his daughter. I share that Leah Dalton seems relatively comfortable, and we are doing our best to make sure that she has peace during this time. He states he understands time is short, no questions or concerns at this time.  Conference with nursing staff regarding end-of-life care.  Length of Stay: 5  Current Medications: Scheduled Meds:    Continuous Infusions: . morphine 4 mg/hr (10-08-2016 4709)    PRN Meds: acetaminophen **OR** acetaminophen, albuterol, ALPRAZolam, feeding supplement, ondansetron **OR** ondansetron (ZOFRAN) IV  Physical Exam  Constitutional: No distress.  Does not respond to voice her touch, continuous infusion of morphine, seems comfortable  HENT:  Head: Atraumatic.  Cardiovascular: Normal rate and regular rhythm.   Pulmonary/Chest:  Long periods of apnea, agonal breathing  Abdominal: Soft. She exhibits distension.  Musculoskeletal: She exhibits edema.    Anasarca  Neurological:  Does not respond to voice her touch, continuous morphine infusion  Skin: Skin is warm.  Edema throughout, bilateral arms with bruising and blisters, swelling/anasarca  Nursing note and vitals reviewed.           Vital Signs: BP 122/84 (BP Location: Left Wrist)   Pulse (!) 102   Temp 97.6 F (36.4 C) (Oral)   Resp (!) 4   Ht 5\' 4"  (1.626 m)   Wt 60.8 kg (134 lb)   SpO2 98%   BMI 23.00 kg/m  SpO2: SpO2: 98 % O2 Device: O2 Device: Nasal Cannula O2 Flow Rate: O2 Flow Rate (L/min): 2 L/min  Intake/output summary:  Intake/Output Summary (Last  24 hours) at 10/12/16 1410 Last data filed at 2016/10/12 1761  Gross per 24 hour  Intake            66.25 ml  Output                0 ml  Net            66.25 ml   LBM: Last BM Date: 09/07/2016 Baseline Weight: Weight: 60.8 kg (134 lb) Most recent weight: Weight: 60.8 kg (134 lb)       Palliative Assessment/Data:    Flowsheet Rows     Most Recent Value  Intake Tab  Referral Department  Hospitalist  Unit at Time of Referral  Med/Surg Unit  Palliative Care Primary Diagnosis  Pulmonary  Date Notified  09/18/16  Palliative Care Type  Return patient Palliative Care  Reason for referral  Clarify Goals of Care  Date of Admission  09/02/2016  Date first seen by Palliative Care  09/18/16  # of days Palliative referral response time  0 Day(s)  # of days IP prior to Palliative referral  5  Clinical Assessment  Palliative Performance Scale Score  20%  Pain Max last 24 hours  Not able to report  Pain Min Last 24 hours  Not able to report  Dyspnea Max Last 24 Hours  Not able to report  Dyspnea Min Last 24 hours  Not able to report  Psychosocial & Spiritual Assessment  Palliative Care Outcomes  Patient/Family meeting held?  Yes  Who was at the meeting?  Patient, son Leah Dalton and daughter-in-law Leah Dalton  Palliative Care Outcomes  Clarified goals of care, Changed to focus on comfort  Patient/Family wishes: Interventions  discontinued/not started   Mechanical Ventilation      Patient Active Problem List   Diagnosis Date Noted  . Protein-calorie malnutrition, severe 09/15/2016  . AKI (acute kidney injury) ([place]) 09/14/2016  . Acute renal failure (ARF) ([place]) 09/16/2016  . Hyperglycemia 09/17/2016  . Encounter for hospice care discussion   . Palliative care encounter   . Goals of care, counseling/discussion   . DNR (do not resuscitate) discussion   . Hypoalbuminemia   . Thrombocytosis ([place]) 08/21/2016  . Leukocytosis 08/21/2016  . Positive D dimer 08/21/2016  . HCAP (healthcare-associated pneumonia) 08/21/2016  . Pneumonia of right middle lobe due to infectious organism ([place])   . Acute hypokalemia 02/20/2016  . 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 AVM (arteriovenous malformation) of colon 04/10/2014  . IDA (iron deficiency anemia) 04/10/2014  . External hemorrhoids 02/24/2014  . Reflux esophagitis   . Anemia 02/22/2014  . COPD (chronic obstructive pulmonary disease) ([place]) 06/07/2012  . Acute-on-chronic respiratory failure ([place]) 06/07/2012  . Polycythemia 06/07/2012  . Hyponatremia 06/07/2012    Palliative Care Assessment & Plan   Patient Profile: Leah Dalton is a 71 year old female with a history of GERD, hereditary hemochromatosis, polycythemia with history of phlebotomy, COPD, nursing home residents since November 2017, bedbound since January 2018. She presented to ED from [name] [place] home with abnormal labs. Her hgb was less than 7 and creatinine was 1.4 (baseline 0.3).  Assessment: end-stage COPD; no acute respiratory distress at this time. Starting morphine continuous infusion for dyspnea and pain. end of life; Leah Dalton is shared with her daughter-in-law Leah Dalton that she is ready to die. She tells me she would like to be unburdened from treatments that aren't helping her. She wants to be pain free. Morphine continuous infusion started.  Recommendations/Plan: morphine  infusion for comfort care. Anticipated death within hours to days. Death is imminent, anticipated Hospital death.   Goals of Care and Additional Recommendations:  Limitations on Scope of Treatment: Full Comfort Care  Code Status:    Code Status Orders        Start     Ordered   09/05/2016 1635  Do not attempt resuscitation (DNR)  Continuous    Question Answer Comment  In the event of cardiac or respiratory ARREST Do not call a "code blue"   In the event of cardiac or respiratory ARREST Do not perform Intubation, CPR, defibrillation or ACLS   In the event of cardiac or respiratory ARREST Use medication by any route, position, wound care, and other measures to relive pain and suffering. May use oxygen, suction and manual treatment of airway obstruction as needed for comfort.      08/29/2016 1634    Code Status History    Date Active Date Inactive Code Status Order ID Comments User Context   08/25/2016  1:20 PM 08/28/2016  5:16 PM DNR [id number]  Drue Novel, NP Inpatient   08/21/2016  6:26 PM 08/25/2016  1:20 PM Full Code [id number]  Vianne Bulls, MD ED   03/09/2016  5:22 PM 03/15/2016  4:41 PM Full Code [id number]  Sinda Du, MD Inpatient   02/20/2016  3:02 PM 02/26/2016  2:31 PM Full Code [id number]  Orvan Falconer, MD Inpatient   02/22/2014 11:14 AM 02/24/2014  3:13 PM Full Code [id number]  Donne Hazel, MD ED   06/07/2012  4:34 PM 06/10/2012  7:58 PM Full Code [id number]  Rexene Alberts, MD ED       Prognosis:   Hours - Days  Discharge Planning:  Anticipated Hospital Death  Care plan was discussed with nursing staff, case manager, social worker, and Dr. Charlies Silvers on next rounds.  Thank you for allowing the Palliative Medicine Team to assist in the care of this patient.   Time In: 1215 Time Out: 1240 Total Time 25 minutes Prolonged Time Billed  no       Greater than 50%  of this time was spent counseling and coordinating care related to the above assessment and plan.  Drue Novel, NP  Please contact Palliative Medicine Team phone at [phone] for questions and concerns.

## 2016-09-24 NOTE — Progress Notes (Addendum)
Patient deceased.  Time of death 39, verified by two RNs.  On call MD notified.  Next of kin in the room.  Morphine drip stopped at 1925.  Remaining 193.28 mg of Morphine drip wasted and witnessed by Ronne Binning RN and Deeann Dowse RN.  [facility] notified.  Patient placement also notified.  Will finish check list and prepare body for funeral home pick up when family leaves.

## 2016-09-24 NOTE — Care Management Note (Signed)
Case Management Note  Patient Details  Name: LATANYA HEMMER MRN: [id number] Date of Birth: 09/10/1945  Expected Discharge Date:     2016-09-28             Expected Discharge Plan:  [place]  In-House Referral:  Clinical Social Work  Discharge planning Services  CM Consult  Post Acute Care Choice:  NA Choice offered to:  NA  DME Arranged:    DME Agency:     HH Arranged:    [place] Agency:     Status of Service:  Completed, signed off  If discussed at [employer] of Stay Meetings, dates discussed:  09/28/16  Additional Comments: now comfort care, agonal breathing this morning.  Naylah Cork, Chauncey Reading, RN 09-28-16, 10:17 AM

## 2016-09-24 NOTE — Progress Notes (Addendum)
Patient ID: Leah Dalton, female   DOB: Aug 14, 1945, 71 y.o.   MRN: [id number]  PROGRESS NOTE    Leah Dalton  PPJ:[id number] DOB: March 15, 1946 DOA: 09/14/2016  PCP: Crisoforo Oxford, MD   Brief Narrative:  71 year old female with GERD, hereditary hemochromatosis, polycythemia with history of phlebotomy, COPD. Pt presented to ED from nursing home with abnormal labs. Her hgb was less than 7 and creatinine was 1.4 (baseline 0.3).  Pt continued to decline clinically during this hospital stay so palliative was consulted and family requested comfort care.  Assessment & Plan:   Principal Problem:   Acute renal failure (ARF) ([place]) - Likely due to ATN and probably exacerbated by lasix - Lasix stopped 6/25 as focus on comfort - Cr continued to trend up to 3.11  Active Problems:   Hyponatremia - Secondary to lasix  - No further blood work to ensure comfort     COPD (chronic obstructive pulmonary disease) ([place]) - Supportive care with bronchodilators as needed    HCAP / Leukocytosis - Stopped cefepime 6/25 as focus on comfort care  - Continue oxygen support via [place] to keep O2 sats above 90%    Iron deficiency anemia / anemia of chronic kidney disease - Has received 1 U PRBC transfusion 6/23    Protein-calorie malnutrition, severe - In the context of acute on chronic illness     Essential hypertension - No meds at this point other than morphine for comfort    DVT prophylaxis:SCD's Code Status: DNR/DNI Family Communication: multiple family in the room this am Disposition Plan: not stable for discharge at this time    Consultants:   Renal   Pulmonary   Palliative care   Procedures:   None   Antimicrobials:   Cefepime    Subjective: Less alert this am.   Objective: Vitals:   10-12-16 0251 10-12-2016 0408 12-Oct-2016 0545 10-12-16 0703  BP:      Pulse:      Resp: (!) 3 (!) 3 (!) 3 (!) 4  Temp:      TempSrc:      SpO2:      Weight:      Height:         Intake/Output Summary (Last 24 hours) at 10-12-2016 0854 Last data filed at 10-12-16 5809  Gross per 24 hour  Intake            66.25 ml  Output              850 ml  Net          -783.75 ml   Filed Weights   09/18/2016 1153 09/04/2016 1700  Weight: 60.8 kg (134 lb) 60.8 kg (134 lb)    Examination:  Physical Exam   General exam: apneic breaths  Respiratory system: rhonchorous, no wheezing  Cardiovascular system: appreciate S1, S2, rate controlled  Gastrointestinal system: (+) BS, non tenderness  Central nervous system: does not respond to verbal stimuli  Extremities: has (+) 2-3 LE pitting edema, redness over bilateral UE Skin: erythematous UE Psychiatry: calm, no agitation    Data Reviewed: I have personally reviewed following labs and imaging studies  CBC:  Recent Labs Lab 08/27/2016 1216 09/14/16 0410 09/16/16 0506 09/17/16 0504 09/18/16 0531  WBC 17.6* 14.0* 11.0* 6.8 12.4*  NEUTROABS 14.6*  --   --   --   --   HGB 8.1* 7.1* 6.3* 6.7* 8.3*  HCT 25.0* 22.0* 19.3* 20.0* 24.1*  MCV 91.2 92.1  91.0 91.3 89.3  PLT 471* 460* 455* 340 102   Basic Metabolic Panel:  Recent Labs Lab 09/14/16 0410 09/15/16 0745 09/16/16 0506 09/17/16 0504 09/18/16 0531  NA 130* 128* 125* 127* 128*  K 5.0 4.9 5.1 5.0 4.5  CL 103 103 101 102 99*  CO2 19* 17* 15* 15* 17*  GLUCOSE 127* 85 92 93 91  BUN 71* 78* 83* 90* 100*  CREATININE 1.76* 2.31* 2.63* 2.88* 3.11*  CALCIUM 8.9 8.2* 8.1* 8.4* 8.6*  PHOS  --   --  5.7* 6.6* 6.6*   GFR: Estimated Creatinine Clearance: 14.3 mL/min (A) (by C-G formula based on SCr of 3.11 mg/dL (H)). Liver Function Tests:  Recent Labs Lab 09/08/2016 1216 09/16/16 0506 09/17/16 0504 09/18/16 0531  AST 15  --   --   --   ALT 17  --   --   --   ALKPHOS 72  --   --   --   BILITOT 0.4  --   --   --   PROT 5.1*  --   --   --   ALBUMIN 2.0* 1.9* 3.0* 3.8   No results for input(s): LIPASE, AMYLASE in the last 168 hours. No results for input(s):  AMMONIA in the last 168 hours. Coagulation Profile:  Recent Labs Lab 09/21/2016 1251  INR 1.04   Cardiac Enzymes: No results for input(s): CKTOTAL, CKMB, CKMBINDEX, TROPONINI in the last 168 hours. BNP (last 3 results) No results for input(s): PROBNP in the last 8760 hours. HbA1C: No results for input(s): HGBA1C in the last 72 hours. CBG:  Recent Labs Lab 09/17/16 1110 09/17/16 1619 09/17/16 2122 09/18/16 0800 09/18/16 1128  GLUCAP 110* 96 96 97 88   Lipid Profile: No results for input(s): CHOL, HDL, LDLCALC, TRIG, CHOLHDL, LDLDIRECT in the last 72 hours. Thyroid Function Tests: No results for input(s): TSH, T4TOTAL, FREET4, T3FREE, THYROIDAB in the last 72 hours. Anemia Panel:  Recent Labs  09/17/16 0504  FERRITIN 194  TIBC 112*  IRON 103   Urine analysis:    Component Value Date/Time   COLORURINE STRAW (A) 09/16/2016 1706   APPEARANCEUR HAZY (A) 09/16/2016 1706   LABSPEC 1.008 09/16/2016 1706   PHURINE 5.0 09/16/2016 1706   GLUCOSEU NEGATIVE 09/16/2016 1706   HGBUR SMALL (A) 09/16/2016 1706   BILIRUBINUR NEGATIVE 09/16/2016 1706   KETONESUR NEGATIVE 09/16/2016 1706   PROTEINUR 30 (A) 09/16/2016 1706   NITRITE NEGATIVE 09/16/2016 1706   LEUKOCYTESUR MODERATE (A) 09/16/2016 1706   Sepsis Labs: @LABRCNTIP (procalcitonin:4,lacticidven:4)    MRSA PCR Screening     Status: None   Collection Time: 09/20/2016  9:00 PM  Result Value Ref Range Status   MRSA by PCR NEGATIVE NEGATIVE Final      Radiology Studies: US Renal Result Date: 09/14/2016 Prominence of renal pyramids bilaterally in a symmetric manner. Suspect anatomic variant. Study otherwise unremarkable.   Dg Chest Portable 1 View Result Date: 09/04/2016  Marked improvement in the infiltrates in the right upper lobe medially and in the right lung base. Irregular nodular density in the right upper lobe persists. I recommend a CT scan of the chest without contrast in 2 months for further evaluation of  this area.     Scheduled Meds:  Continuous Infusions: . morphine 4 mg/hr (10/09/16 0633)     LOS: 5 days    Time spent: 25 minutes  Greater than 50% of the time spent on counseling and coordinating the care.   Leisa Lenz, MD  [facility] Pager [phone]  If 7PM-7AM, please contact night-coverage [email or web address] Password [facility] Sep 27, 2016, 8:54 AM

## 2016-09-24 NOTE — Plan of Care (Signed)
Problem: Role Relationship: Goal: Familys ability to cope with current situation will improve Outcome: Progressing Family at bedside with patient. Expresses that they are satisfied with patient's care at this time.

## 2016-09-24 DEATH — deceased

## 2016-10-25 NOTE — Discharge Summary (Signed)
Death Summary  AIMA MCWHIRT YIF:[id number] DOB: 28-Dec-1945 DOA: September 30, 2016  PCP: Crisoforo Oxford, MD PCP/Office notified  Admit date: September 30, 2016 Date of Death: 2016/10/16  Final Diagnoses:  Principal Problem:   Acute renal failure (ARF) ([place]) Active Problems:   COPD (chronic obstructive pulmonary disease) (HCC)   Anemia   Thrombocytosis (HCC)   Leukocytosis   Hypoalbuminemia   Hyperglycemia   AKI (acute kidney injury) ([place])   Protein-calorie malnutrition, severe   I was not physically present at the time pt expired.   History of present illness:  71 year old female with GERD, hereditary hemochromatosis, polycythemia with history of phlebotomy, COPD. Pt presented to ED from nursing home with abnormal labs. Her hgb was less than 7 and creatinine was 1.4 (baseline 0.3).  Pt continued to decline clinically during this hospital stay so palliative was consulted and family requested comfort care.  Hospital Course:   Assessment & Plan:   Principal Problem:   Acute renal failure (ARF) ([place]) - Likely due to ATN and probably exacerbated by lasix - Lasix stopped 6/25 as focus on comfort  Active Problems:   Hyponatremia - Secondary to lasix     COPD (chronic obstructive pulmonary disease) (HCC) - Supportive care with bronchodilators as needed    HCAP / Leukocytosis - Stopped cefepime 6/25 as focus on comfort care     Iron deficiency anemia / anemia of chronic kidney disease - Has received 1 U PRBC transfusion 6/23    Protein-calorie malnutrition, severe - In the context of acute on chronic illness     Essential hypertension - No meds at this point other than morphine for comfort    DVT prophylaxis:SCD's Code Status: DNR/DNI   Consultants:   Renal   Pulmonary   Palliative care   Procedures:   None   Antimicrobials:   Cefepime    Time: 19:25 on October 06, 2016   Signed:  Leisa Lenz  [facility] 10/16/16, 11:58 PM

## 2018-08-01 IMAGING — CT CT CHEST W/ CM
2 of 3 series · 15 of 36 positions shown, 18 images · IV contrast (iopamidol)
Comparison: 09/30/2008

CLINICAL DATA: Pneumonia.  Significantly this leukocytosis (43)

EXAM:
CT CHEST WITH CONTRAST
TECHNIQUE: Multidetector CT imaging of the chest was performed during
intravenous contrast administration.
CONTRAST:  75mL HQ1N1C-BEE IOPAMIDOL (HQ1N1C-BEE) INJECTION 61%

[Series 2: axial st · axial · 0.63mm/px · z∈[-2,+286]mm · 12 of 170 slices shown, 15 images]
[im 13/170  mediastinal]
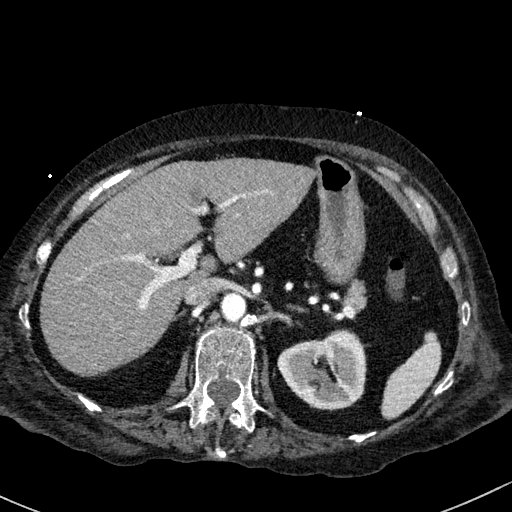
[im 13/170  lung]
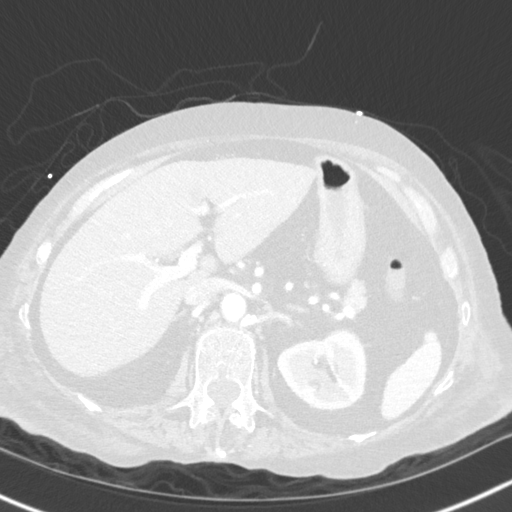
[im 26/170  lung]
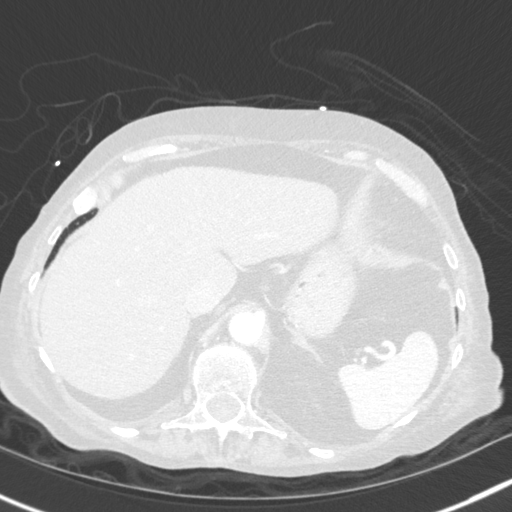
[im 38/170  lung]
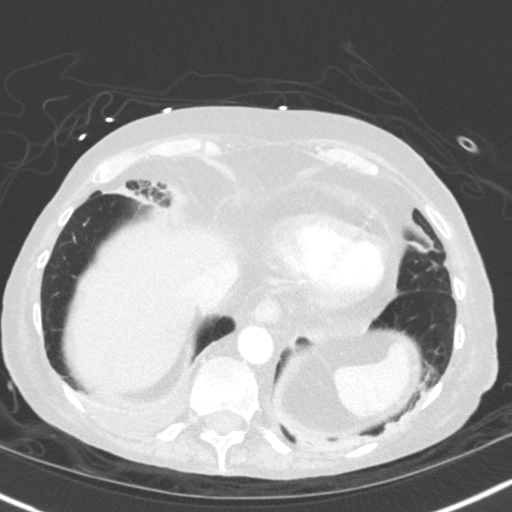
[im 51/170  lung]
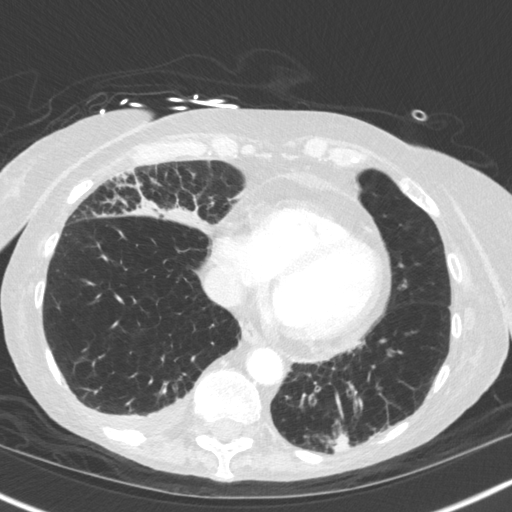
[im 63/170  mediastinal]
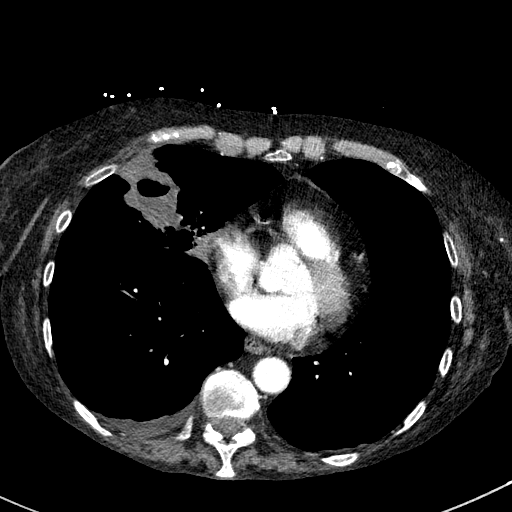
[im 63/170  lung]
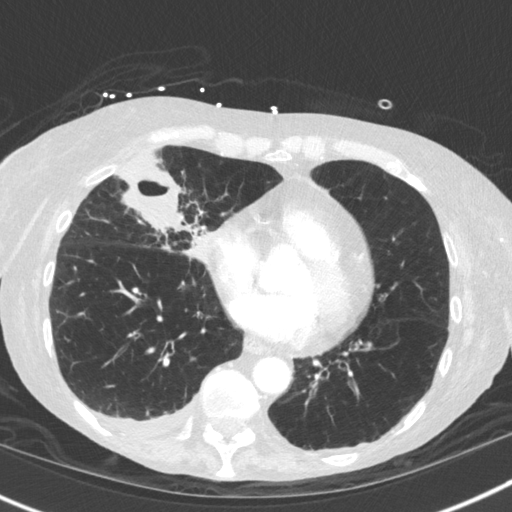
[im 76/170  lung]
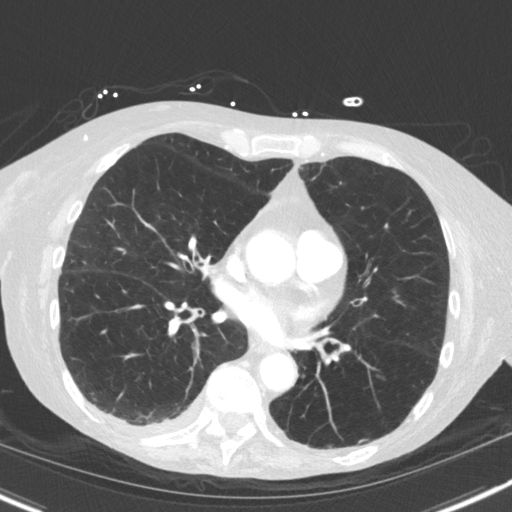
[im 94/170  lung]
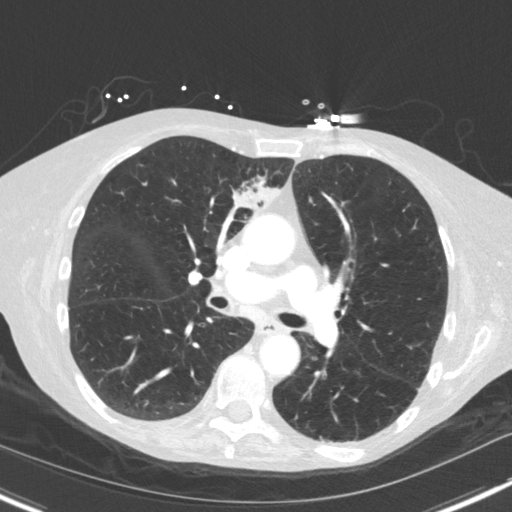
[im 107/170  lung]
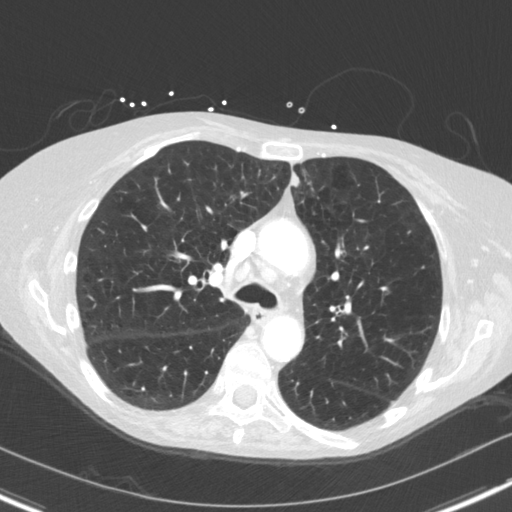
[im 119/170  mediastinal]
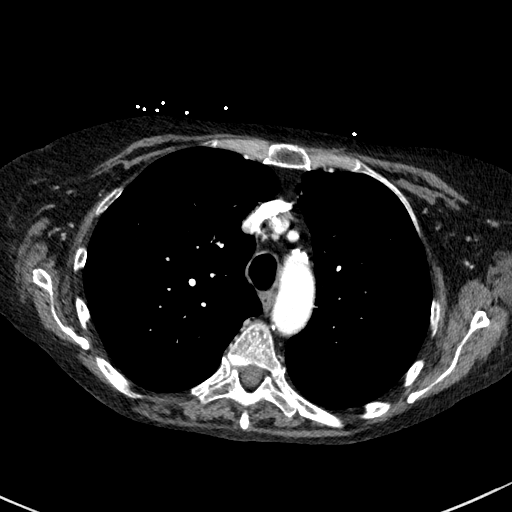
[im 119/170  lung]
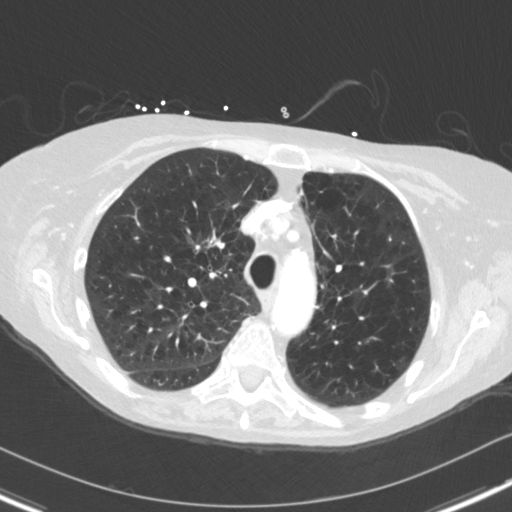
[im 132/170  lung]
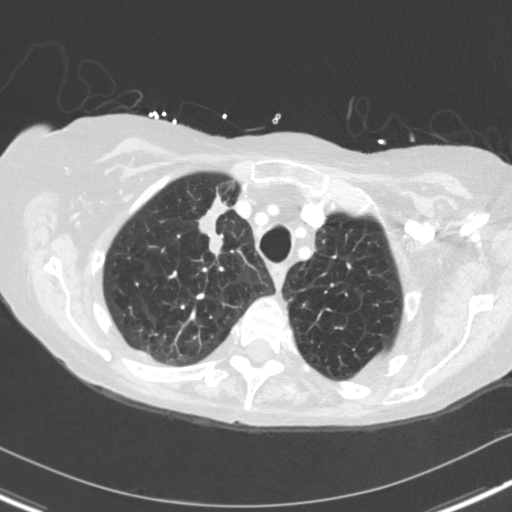
[im 144/170  lung]
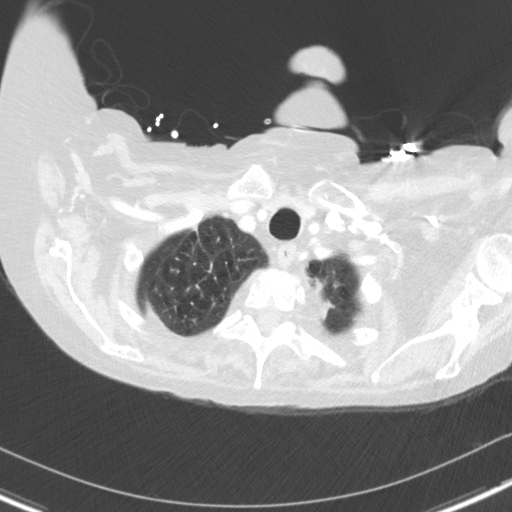
[im 157/170  lung]
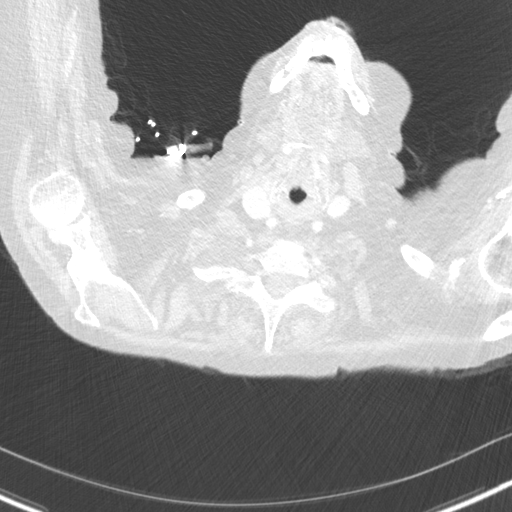

[Series 6: coronal · coronal · 0.77mm/px · 3 of 117 slices shown]
[im 24/117  lung]
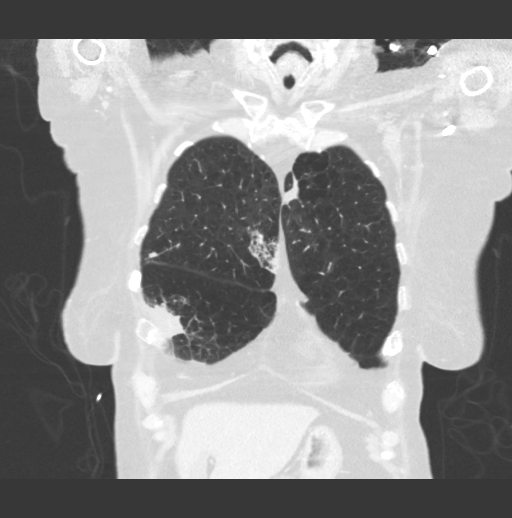
[im 47/117  lung]
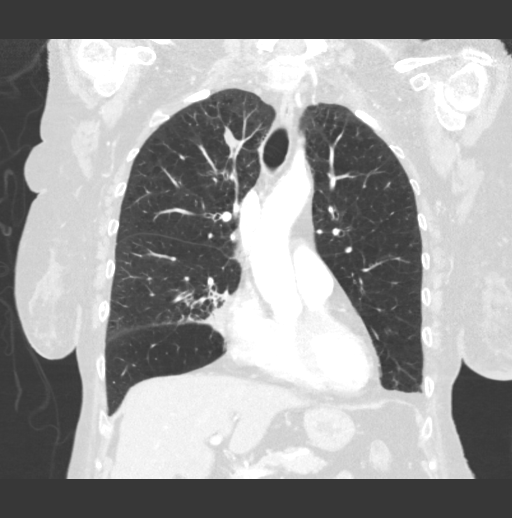
[im 70/117  lung]
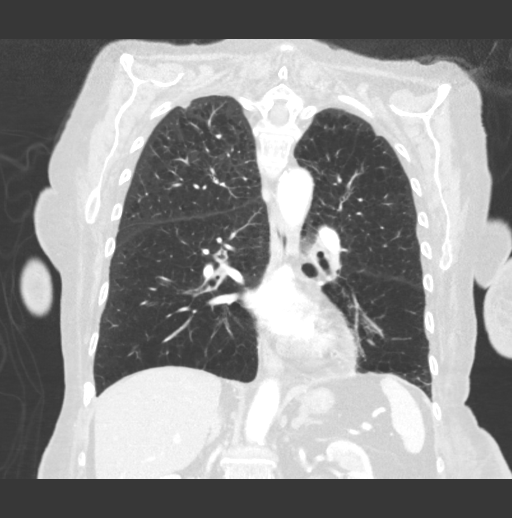

[15 of 36 positions shown; findings below may reference images not displayed]

FINDINGS: Cardiovascular: Normal heart size. Trace pericardial fluid that is
low-density. No acute vascular finding. Atherosclerotic
calcification.

Mediastinum/Nodes: Mild prominence of hilar lymph nodes, considered
reactive in this setting.

Lungs/Pleura: There are patchy areas of airspace disease seen in the
right upper lobe (low-density lobulated abnormality seen anteriorly
and more consolidative hypoenhancing consolidations seen medially).
There is extensive consolidation in the right middle lobe with an
internal cavitation containing fluid level. There is no enhancing
capsule to the cavity; size is at least 2 cm. Small nodular opacity
in the medial left upper lobe.

9 mm solid nodule in the posterior right upper lobe which does not
appear similar to the inflammatory/infectious opacities.

Centrilobular emphysema and diffuse airway thickening. Trace right
pleural effusion which appears layering.

Upper Abdomen: No acute finding. There may be steatosis of the
liver.

Musculoskeletal: Anterior left third and fourth rib fractures with
subacute to chronic appearance. Multiple healing right rib
fractures. Chronic appearing T12 superior endplate deformity.
IMPRESSION: 1. Right middle lobe cavitary pneumonia with fluid level. The cavity
measures at least 2 cm.
2. There are other areas of pneumonia with necrotic/cavitary
features, consider septic embolic process.
3. Small layering right pleural effusion.
4. 9 mm right upper lobe nodule that does not appear
infectious/inflammatory. Recommend chest CT follow-up in 3 months.
5. Healing bilateral rib fractures.
6.  Emphysema ([id number]).

## 2018-08-23 IMAGING — CR DG CHEST 1V PORT
1 series · 1 of 1 positions shown · non-contrast
Comparison: Chest CT dated 08/22/2016 and chest x-rays dated
08/21/2016 and 04/19/2016

CLINICAL DATA: Weakness and shortness of breath.

EXAM:
PORTABLE CHEST 1 VIEW

[portable]
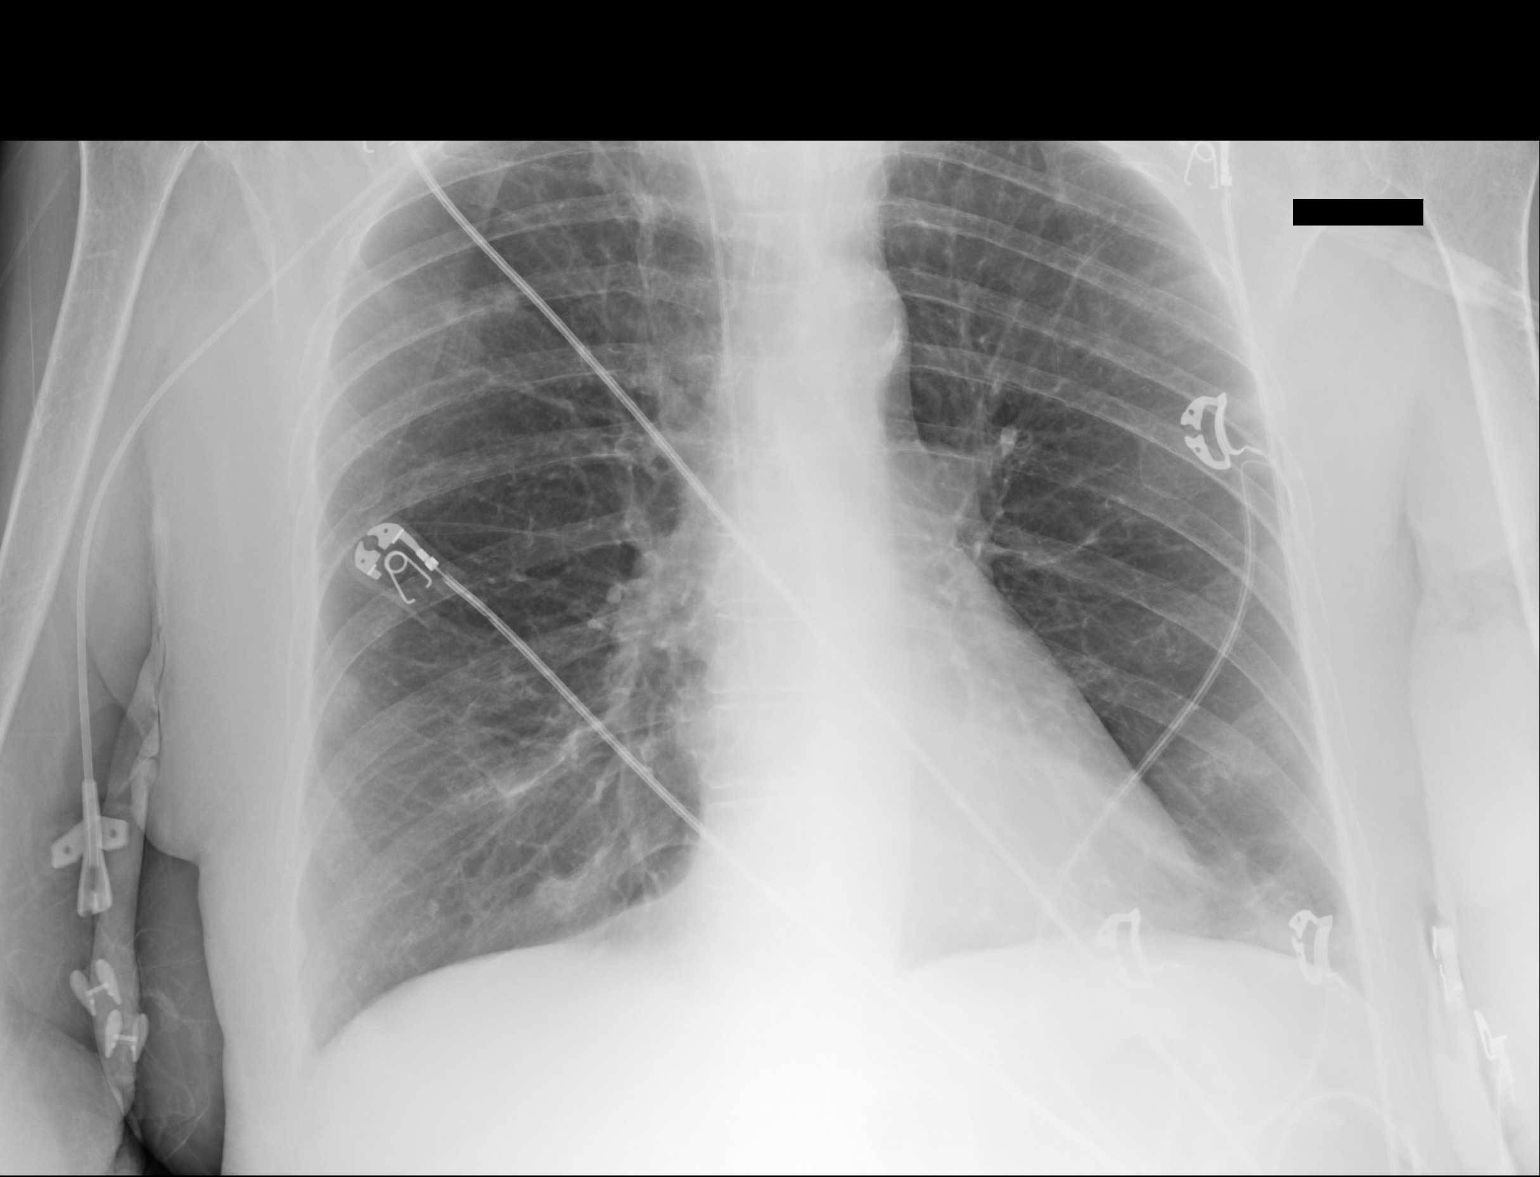

[1 of 1 positions shown; findings below may reference images not displayed]

FINDINGS: There has been almost complete resolution of the cavitary infiltrate
at the right lung base as well as the infiltrate medially in the
right upper lobe. Irregular nodular density in the right upper lobe
persists as demonstrated on the prior CT scan.

No acute abnormalities. Heart size and vascularity are normal. PICC
appears in good position.
IMPRESSION: Marked improvement in the infiltrates in the right upper lobe
medially and in the right lung base. Irregular nodular density in
the right upper lobe persists. I recommend a CT scan of the chest
without contrast in 2 months for further evaluation of this area.
